# Patient Record
Sex: Female | Born: 1970
Health system: Southern US, Community
[De-identification: ages and names within clinical notes are randomized; demographics above are authoritative.]

## PROBLEM LIST (undated history)

## (undated) ENCOUNTER — Ambulatory Visit: Admission: EM | Payer: 59 | Source: Home / Self Care

## (undated) DIAGNOSIS — K219 Gastro-esophageal reflux disease without esophagitis: Secondary | ICD-10-CM

## (undated) DIAGNOSIS — T7840XA Allergy, unspecified, initial encounter: Secondary | ICD-10-CM

## (undated) DIAGNOSIS — Z803 Family history of malignant neoplasm of breast: Secondary | ICD-10-CM

## (undated) HISTORY — DX: Gastro-esophageal reflux disease without esophagitis: K21.9

## (undated) HISTORY — DX: Allergy, unspecified, initial encounter: T78.40XA

## (undated) HISTORY — DX: Family history of malignant neoplasm of breast: Z80.3

## (undated) HISTORY — PX: WISDOM TOOTH EXTRACTION: SHX21

---

## 2003-12-16 ENCOUNTER — Emergency Department (HOSPITAL_COMMUNITY): Admission: EM | Admit: 2003-12-16 | Discharge: 2003-12-16 | Payer: Self-pay | Admitting: Family Medicine

## 2006-09-12 ENCOUNTER — Encounter (INDEPENDENT_AMBULATORY_CARE_PROVIDER_SITE_OTHER): Payer: Self-pay | Admitting: Specialist

## 2006-09-12 ENCOUNTER — Inpatient Hospital Stay (HOSPITAL_COMMUNITY): Admission: AD | Admit: 2006-09-12 | Discharge: 2006-09-12 | Payer: Self-pay | Admitting: Gynecology

## 2006-09-14 ENCOUNTER — Inpatient Hospital Stay (HOSPITAL_COMMUNITY): Admission: AD | Admit: 2006-09-14 | Discharge: 2006-09-14 | Payer: Self-pay | Admitting: Obstetrics and Gynecology

## 2006-09-17 ENCOUNTER — Inpatient Hospital Stay (HOSPITAL_COMMUNITY): Admission: AD | Admit: 2006-09-17 | Discharge: 2006-09-17 | Payer: Self-pay | Admitting: Family Medicine

## 2006-09-18 ENCOUNTER — Ambulatory Visit: Payer: Self-pay | Admitting: Family Medicine

## 2006-09-18 LAB — CONVERTED CEMR LAB
ALT: 36 units/L (ref 0–40)
Basophils Relative: 0.1 % (ref 0.0–1.0)
Bilirubin, Direct: 0.1 mg/dL (ref 0.0–0.3)
CO2: 31 meq/L (ref 19–32)
Calcium: 9.5 mg/dL (ref 8.4–10.5)
Eosinophils Relative: 0.7 % (ref 0.0–5.0)
GFR calc Af Amer: 144 mL/min
Glucose, Bld: 218 mg/dL — ABNORMAL HIGH (ref 70–99)
Hemoglobin: 14.4 g/dL (ref 12.0–15.0)
Lymphocytes Relative: 25.9 % (ref 12.0–46.0)
Microalb Creat Ratio: 11.2 mg/g (ref 0.0–30.0)
Monocytes Absolute: 0.4 10*3/uL (ref 0.2–0.7)
Neutro Abs: 4.4 10*3/uL (ref 1.4–7.7)
Potassium: 3.8 meq/L (ref 3.5–5.1)
Total Bilirubin: 0.6 mg/dL (ref 0.3–1.2)
Total Protein: 7.1 g/dL (ref 6.0–8.3)
VLDL: 54 mg/dL — ABNORMAL HIGH (ref 0–40)
WBC: 6.5 10*3/uL (ref 4.5–10.5)

## 2006-09-25 ENCOUNTER — Ambulatory Visit: Payer: Self-pay | Admitting: Family Medicine

## 2006-10-01 ENCOUNTER — Encounter: Admission: RE | Admit: 2006-10-01 | Discharge: 2006-12-30 | Payer: Self-pay | Admitting: Family Medicine

## 2006-10-19 ENCOUNTER — Ambulatory Visit: Payer: Self-pay | Admitting: Family Medicine

## 2006-10-25 ENCOUNTER — Encounter: Admission: RE | Admit: 2006-10-25 | Discharge: 2006-10-25 | Payer: Self-pay | Admitting: Family Medicine

## 2006-11-06 ENCOUNTER — Ambulatory Visit: Payer: Self-pay | Admitting: Family Medicine

## 2006-12-14 ENCOUNTER — Ambulatory Visit: Payer: Self-pay | Admitting: Family Medicine

## 2006-12-25 ENCOUNTER — Ambulatory Visit: Payer: Self-pay | Admitting: Family Medicine

## 2007-01-08 ENCOUNTER — Ambulatory Visit: Payer: Self-pay | Admitting: Family Medicine

## 2007-01-08 ENCOUNTER — Encounter: Payer: Self-pay | Admitting: Family Medicine

## 2007-01-08 ENCOUNTER — Other Ambulatory Visit: Admission: RE | Admit: 2007-01-08 | Discharge: 2007-01-08 | Payer: Self-pay | Admitting: Family Medicine

## 2007-01-21 ENCOUNTER — Encounter: Payer: Self-pay | Admitting: Family Medicine

## 2007-01-21 DIAGNOSIS — E119 Type 2 diabetes mellitus without complications: Secondary | ICD-10-CM | POA: Insufficient documentation

## 2007-02-12 ENCOUNTER — Ambulatory Visit: Payer: Self-pay | Admitting: Family Medicine

## 2007-02-12 LAB — CONVERTED CEMR LAB
Chloride: 103 meq/L (ref 96–112)
Cholesterol: 213 mg/dL (ref 0–200)
GFR calc Af Amer: 103 mL/min
GFR calc non Af Amer: 85 mL/min
Hgb A1c MFr Bld: 9 % — ABNORMAL HIGH (ref 4.6–6.0)
Potassium: 3.9 meq/L (ref 3.5–5.1)
Sodium: 137 meq/L (ref 135–145)
Total CHOL/HDL Ratio: 4.7
Triglycerides: 167 mg/dL — ABNORMAL HIGH (ref 0–149)
VLDL: 33 mg/dL (ref 0–40)

## 2007-03-15 ENCOUNTER — Ambulatory Visit: Payer: Self-pay | Admitting: Family Medicine

## 2007-06-11 ENCOUNTER — Ambulatory Visit: Payer: Self-pay | Admitting: Family Medicine

## 2007-06-16 ENCOUNTER — Emergency Department (HOSPITAL_COMMUNITY): Admission: EM | Admit: 2007-06-16 | Discharge: 2007-06-16 | Payer: Self-pay | Admitting: Family Medicine

## 2007-07-05 ENCOUNTER — Ambulatory Visit: Payer: Self-pay | Admitting: Internal Medicine

## 2007-07-05 DIAGNOSIS — J029 Acute pharyngitis, unspecified: Secondary | ICD-10-CM | POA: Insufficient documentation

## 2007-07-10 DIAGNOSIS — T7840XA Allergy, unspecified, initial encounter: Secondary | ICD-10-CM | POA: Insufficient documentation

## 2007-07-12 ENCOUNTER — Telehealth: Payer: Self-pay | Admitting: Family Medicine

## 2007-07-12 ENCOUNTER — Ambulatory Visit: Payer: Self-pay | Admitting: Family Medicine

## 2007-11-25 ENCOUNTER — Telehealth: Payer: Self-pay | Admitting: *Deleted

## 2008-01-06 ENCOUNTER — Encounter: Payer: Self-pay | Admitting: *Deleted

## 2008-01-06 ENCOUNTER — Telehealth: Payer: Self-pay | Admitting: Family Medicine

## 2008-03-12 ENCOUNTER — Telehealth: Payer: Self-pay | Admitting: *Deleted

## 2008-03-27 ENCOUNTER — Telehealth: Payer: Self-pay | Admitting: Family Medicine

## 2008-07-25 LAB — HM DIABETES EYE EXAM: HM Diabetic Eye Exam: NORMAL

## 2008-07-31 ENCOUNTER — Telehealth: Payer: Self-pay | Admitting: Family Medicine

## 2008-08-21 ENCOUNTER — Telehealth: Payer: Self-pay | Admitting: Family Medicine

## 2008-09-17 ENCOUNTER — Telehealth: Payer: Self-pay | Admitting: Family Medicine

## 2008-09-23 ENCOUNTER — Telehealth: Payer: Self-pay | Admitting: Family Medicine

## 2008-09-25 ENCOUNTER — Telehealth: Payer: Self-pay | Admitting: Family Medicine

## 2008-09-25 ENCOUNTER — Telehealth: Payer: Self-pay | Admitting: *Deleted

## 2008-10-13 ENCOUNTER — Telehealth: Payer: Self-pay | Admitting: Family Medicine

## 2008-10-16 ENCOUNTER — Ambulatory Visit: Payer: Self-pay | Admitting: Family Medicine

## 2008-10-16 LAB — CONVERTED CEMR LAB
AST: 29 units/L (ref 0–37)
Albumin: 3.9 g/dL (ref 3.5–5.2)
Alkaline Phosphatase: 65 units/L (ref 39–117)
Basophils Absolute: 0 10*3/uL (ref 0.0–0.1)
Basophils Relative: 0.1 % (ref 0.0–3.0)
Bilirubin Urine: NEGATIVE
Bilirubin, Direct: 0 mg/dL (ref 0.0–0.3)
Blood in Urine, dipstick: NEGATIVE
Calcium: 9.8 mg/dL (ref 8.4–10.5)
Cholesterol: 292 mg/dL — ABNORMAL HIGH (ref 0–200)
Creatinine, Ser: 0.7 mg/dL (ref 0.4–1.2)
Direct LDL: 125.8 mg/dL
Eosinophils Absolute: 0.4 10*3/uL (ref 0.0–0.7)
GFR calc non Af Amer: 98.38 mL/min (ref >60)
HDL: 58.6 mg/dL (ref >39.00)
Hemoglobin: 14.1 g/dL (ref 12.0–15.0)
Ketones, urine, test strip: NEGATIVE
Lymphocytes Relative: 24.7 % (ref 12.0–46.0)
MCHC: 35.4 g/dL (ref 30.0–36.0)
Monocytes Relative: 7.3 % (ref 3.0–12.0)
Neutro Abs: 4.8 10*3/uL (ref 1.4–7.7)
Neutrophils Relative %: 63.3 % (ref 43.0–77.0)
Nitrite: NEGATIVE
Protein, U semiquant: NEGATIVE
RBC: 4.69 M/uL (ref 3.87–5.11)
RDW: 12.4 % (ref 11.5–14.6)
Sodium: 141 meq/L (ref 135–145)
Total CHOL/HDL Ratio: 5
Triglycerides: 533 mg/dL — ABNORMAL HIGH (ref 0.0–149.0)
Urobilinogen, UA: 0.2

## 2008-10-20 ENCOUNTER — Telehealth: Payer: Self-pay | Admitting: Internal Medicine

## 2008-10-23 ENCOUNTER — Ambulatory Visit: Payer: Self-pay | Admitting: Family Medicine

## 2008-10-23 ENCOUNTER — Other Ambulatory Visit: Admission: RE | Admit: 2008-10-23 | Discharge: 2008-10-23 | Payer: Self-pay | Admitting: Family Medicine

## 2008-10-23 ENCOUNTER — Encounter: Payer: Self-pay | Admitting: Family Medicine

## 2008-10-23 DIAGNOSIS — E785 Hyperlipidemia, unspecified: Secondary | ICD-10-CM | POA: Insufficient documentation

## 2008-11-04 ENCOUNTER — Ambulatory Visit: Payer: Self-pay | Admitting: Family Medicine

## 2008-12-02 ENCOUNTER — Ambulatory Visit: Payer: Self-pay | Admitting: Family Medicine

## 2008-12-02 ENCOUNTER — Encounter: Admission: RE | Admit: 2008-12-02 | Discharge: 2009-03-02 | Payer: Self-pay | Admitting: Family Medicine

## 2008-12-24 ENCOUNTER — Telehealth: Payer: Self-pay | Admitting: Family Medicine

## 2008-12-31 ENCOUNTER — Telehealth: Payer: Self-pay | Admitting: Family Medicine

## 2009-02-02 ENCOUNTER — Ambulatory Visit: Payer: Self-pay | Admitting: Family Medicine

## 2009-02-02 LAB — CONVERTED CEMR LAB
CO2: 30 meq/L (ref 19–32)
Chloride: 104 meq/L (ref 96–112)
Glucose, Bld: 127 mg/dL — ABNORMAL HIGH (ref 70–99)
HDL: 50.8 mg/dL (ref >39.00)
Hgb A1c MFr Bld: 7.4 % — ABNORMAL HIGH (ref 4.6–6.5)
LDL Cholesterol: 81 mg/dL (ref 0–99)
Sodium: 141 meq/L (ref 135–145)
Total CHOL/HDL Ratio: 3
Triglycerides: 168 mg/dL — ABNORMAL HIGH (ref 0.0–149.0)
VLDL: 33.6 mg/dL (ref 0.0–40.0)

## 2009-02-03 ENCOUNTER — Telehealth: Payer: Self-pay | Admitting: Family Medicine

## 2009-02-09 ENCOUNTER — Ambulatory Visit: Payer: Self-pay | Admitting: Family Medicine

## 2009-03-12 ENCOUNTER — Encounter: Admission: RE | Admit: 2009-03-12 | Discharge: 2009-03-12 | Payer: Self-pay | Admitting: Family Medicine

## 2009-04-06 ENCOUNTER — Telehealth: Payer: Self-pay | Admitting: Family Medicine

## 2009-04-20 ENCOUNTER — Telehealth: Payer: Self-pay | Admitting: Family Medicine

## 2009-04-23 ENCOUNTER — Telehealth: Payer: Self-pay | Admitting: Family Medicine

## 2009-05-13 ENCOUNTER — Ambulatory Visit: Payer: Self-pay | Admitting: Family Medicine

## 2009-05-13 LAB — CONVERTED CEMR LAB
BUN: 12 mg/dL (ref 6–23)
Calcium: 9.5 mg/dL (ref 8.4–10.5)
Creatinine, Ser: 0.8 mg/dL (ref 0.4–1.2)
GFR calc non Af Amer: 84.09 mL/min (ref >60)
Hgb A1c MFr Bld: 7.2 % — ABNORMAL HIGH (ref 4.6–6.5)

## 2009-05-17 ENCOUNTER — Ambulatory Visit: Payer: Self-pay | Admitting: Family Medicine

## 2009-05-17 ENCOUNTER — Telehealth: Payer: Self-pay | Admitting: Family Medicine

## 2009-05-17 DIAGNOSIS — M25519 Pain in unspecified shoulder: Secondary | ICD-10-CM | POA: Insufficient documentation

## 2009-05-19 ENCOUNTER — Telehealth: Payer: Self-pay | Admitting: Family Medicine

## 2009-06-17 ENCOUNTER — Encounter: Payer: Self-pay | Admitting: *Deleted

## 2009-07-07 ENCOUNTER — Encounter: Admission: RE | Admit: 2009-07-07 | Discharge: 2009-07-07 | Payer: Self-pay | Admitting: Orthopaedic Surgery

## 2009-07-26 ENCOUNTER — Encounter: Admission: RE | Admit: 2009-07-26 | Discharge: 2009-09-14 | Payer: Self-pay | Admitting: Orthopaedic Surgery

## 2009-08-13 ENCOUNTER — Telehealth: Payer: Self-pay | Admitting: Family Medicine

## 2009-08-19 ENCOUNTER — Ambulatory Visit: Payer: Self-pay | Admitting: Family Medicine

## 2009-08-19 LAB — CONVERTED CEMR LAB
CO2: 29 meq/L (ref 19–32)
Calcium: 9.1 mg/dL (ref 8.4–10.5)
Creatinine, Ser: 0.7 mg/dL (ref 0.4–1.2)
GFR calc non Af Amer: 97.97 mL/min (ref >60)
Sodium: 141 meq/L (ref 135–145)

## 2009-08-24 ENCOUNTER — Ambulatory Visit: Payer: Self-pay | Admitting: Family Medicine

## 2009-10-21 ENCOUNTER — Ambulatory Visit: Payer: Self-pay | Admitting: Family Medicine

## 2009-10-21 LAB — CONVERTED CEMR LAB
ALT: 26 units/L (ref 0–35)
AST: 19 units/L (ref 0–37)
Bilirubin, Direct: 0.1 mg/dL (ref 0.0–0.3)
Calcium: 9.4 mg/dL (ref 8.4–10.5)
Cholesterol: 151 mg/dL (ref 0–200)
Eosinophils Absolute: 0.3 10*3/uL (ref 0.0–0.7)
GFR calc non Af Amer: 83.9 mL/min (ref >60)
Glucose, Bld: 94 mg/dL (ref 70–99)
HDL: 55.5 mg/dL (ref >39.00)
Hgb A1c MFr Bld: 7.2 % — ABNORMAL HIGH (ref 4.6–6.5)
Lymphocytes Relative: 33.8 % (ref 12.0–46.0)
MCHC: 34.6 g/dL (ref 30.0–36.0)
MCV: 85.9 fL (ref 78.0–100.0)
Monocytes Absolute: 0.5 10*3/uL (ref 0.1–1.0)
Neutrophils Relative %: 54.7 % (ref 43.0–77.0)
Platelets: 321 10*3/uL (ref 150.0–400.0)
Sodium: 141 meq/L (ref 135–145)
TSH: 2.14 microintl units/mL (ref 0.35–5.50)
Total Bilirubin: 0.3 mg/dL (ref 0.3–1.2)
Total Protein: 6.9 g/dL (ref 6.0–8.3)
WBC: 7.3 10*3/uL (ref 4.5–10.5)

## 2009-11-18 ENCOUNTER — Other Ambulatory Visit: Admission: RE | Admit: 2009-11-18 | Discharge: 2009-11-18 | Payer: Self-pay | Admitting: Family Medicine

## 2009-11-18 ENCOUNTER — Ambulatory Visit: Payer: Self-pay | Admitting: Family Medicine

## 2010-02-15 ENCOUNTER — Ambulatory Visit: Payer: Self-pay | Admitting: Family Medicine

## 2010-02-15 LAB — CONVERTED CEMR LAB
BUN: 12 mg/dL (ref 6–23)
CO2: 28 meq/L (ref 19–32)
Chloride: 100 meq/L (ref 96–112)
Creatinine, Ser: 0.7 mg/dL (ref 0.4–1.2)
Glucose, Bld: 80 mg/dL (ref 70–99)

## 2010-02-22 ENCOUNTER — Ambulatory Visit: Payer: Self-pay | Admitting: Family Medicine

## 2010-03-18 ENCOUNTER — Encounter: Admission: RE | Admit: 2010-03-18 | Discharge: 2010-03-18 | Payer: Self-pay | Admitting: Family Medicine

## 2010-05-18 ENCOUNTER — Ambulatory Visit: Payer: Self-pay | Admitting: Family Medicine

## 2010-05-18 LAB — CONVERTED CEMR LAB
Chloride: 103 meq/L (ref 96–112)
Creatinine, Ser: 0.8 mg/dL (ref 0.4–1.2)
Direct LDL: 99.2 mg/dL
Hgb A1c MFr Bld: 7.2 % — ABNORMAL HIGH (ref 4.6–6.5)
Potassium: 4 meq/L (ref 3.5–5.1)
Sodium: 139 meq/L (ref 135–145)
Total CHOL/HDL Ratio: 3
VLDL: 56.8 mg/dL — ABNORMAL HIGH (ref 0.0–40.0)

## 2010-05-25 ENCOUNTER — Ambulatory Visit: Payer: Self-pay | Admitting: Family Medicine

## 2010-07-16 ENCOUNTER — Ambulatory Visit
Admission: RE | Admit: 2010-07-16 | Discharge: 2010-07-16 | Payer: Self-pay | Source: Home / Self Care | Attending: Family Medicine | Admitting: Family Medicine

## 2010-07-18 ENCOUNTER — Telehealth: Payer: Self-pay | Admitting: Family Medicine

## 2010-07-31 ENCOUNTER — Encounter: Payer: Self-pay | Admitting: Orthopaedic Surgery

## 2010-08-09 NOTE — Assessment & Plan Note (Signed)
Summary: 3 MONTH ROV/NJR   Vital Signs:  Patient profile:   40 year old female Menstrual status:  regular Weight:      205 pounds Temp:     98.5 degrees F oral BP sitting:   124 / 82  (left arm) Cuff size:   regular  Vitals Entered By: [name] CMA [name]) (May 25, 2010 8:43 AM) CC: follow-up visit   CC:  follow-up visit.  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  She came in last week for an A1c and a BMP.  Her fasting blood sugar was 58, and she felt shaky.  A1c 7.2, which is what is than before.  She is not walking on a regular basis, although she does have C. exercise equipment at home.  She was encouraged to take the stairs instead of the elevator at work.  We talked about tighter control however, with a normal blood sugar running around 80.  Not sure that tight control would be good for her.  I  Allergies: 1)  ! Pcn  Past History:  Past medical, surgical, family and social histories (including risk factors) reviewed for relevance to current acute and chronic problems.  Past Medical History: Reviewed history from 01/21/2007 and no changes required. Diabetes mellitus, type II  Past Surgical History: Reviewed history from 01/21/2007 and no changes required. CB X1 MCX1  Family History: Reviewed history from 12/02/2008 and no changes required. Family History Diabetes 1st degree relative  Social History: Reviewed history from 10/23/2008 and no changes required. Occupation:  Charity fundraiser Divorced Never Smoked Alcohol use-no Drug use-no Regular exercise-no  Review of Systems      See HPI  Physical Exam  General:  Well-developed,well-nourished,in no acute distress; alert,appropriate and cooperative throughout examination   Impression & Recommendations:  Problem # 1:  DIABETES MELLITUS, TYPE I, ADULT ONSET (ICD-250.01) Assessment Improved  Her  updated medication list for this problem includes:    Lisinopril 5 Mg Tabs (Lisinopril) ..... One daily    Metformin Hcl 1000 Mg Tabs (Metformin hcl) .Marland Kitchen... Take 1 tablet by mouth twice a day    Glipizide 10 Mg Tabs (Glipizide) .Marland Kitchen... Take 1 tablet by mouth two times a day    Lantus Solostar 100 Unit/ml Soln (Insulin glargine) ..... Use 36 in the morning and 32 at bedtime  Complete Medication List: 1)  Accu-chek Aviva Strp (Glucose blood) .... Blood sugar 3 times a day 2)  Lisinopril 5 Mg Tabs (Lisinopril) .... One daily 3)  Metformin Hcl 1000 Mg Tabs (Metformin hcl) .... Take 1 tablet by mouth twice a day 4)  Diasense Multivitamin Tabs (Multiple vitamins-minerals) .... One daily 5)  Asa 81 Mg  6)  Glipizide 10 Mg Tabs (Glipizide) .... Take 1 tablet by mouth two times a day 7)  Necon 1/35 (28) 1-35 Mg-mcg Tabs (Norethindrone-eth estradiol) .... Uad 8)  Lantus Solostar 100 Unit/ml Soln (Insulin glargine) .... Use 36 in the morning and 32 at bedtime 9)  Zocor 20 Mg Tabs (Simvastatin) .Marland Kitchen.. 1 tab @ bedtime 10)  Zyrtec Allergy 10 Mg Tabs (Cetirizine hcl) .... Once daily 11)  Bd Lancet Ultrafine 30g Misc (Lancets) .... Use once daily to check glucose levels 12)  Pen Needles 5/16" 31g X 8 Mm Misc (Insulin pen needle) .... Use daily for glucose control  dx 250.00 okay for solostar 13)  Vicodin Es 7.5-750 Mg Tabs (Hydrocodone-acetaminophen) .... Take one  tab by mouth every 6 hours as needed for pain 14)  Advil 200 Mg Tabs (Ibuprofen) .... Take one tab by mouth once daily as needed  Patient Instructions: 1)  Please schedule a follow-up appointment in 3 months. 2)  BMP prior to visit, ICD-9: 3)  HbgA1C prior to visit, ICD-9:.....250.01 4)  Let's keep your medication, the same, but increased her caloric output by walking 15 minutes daily and taking the stairs at work   Orders Added: 1)  Est. Patient Level III [[address]]

## 2010-08-09 NOTE — Assessment & Plan Note (Signed)
Summary: Follow up   Vital Signs:  Patient profile:   40 year old female Menstrual status:  regular Weight:      204 pounds Temp:     98.4 degrees F oral BP sitting:   120 / 80  (left arm) Cuff size:   regular  Vitals Entered By: [name] CMA [name]) (February 22, 2010 10:03 AM)  CC: follow-up visit   CC:  follow-up visit.  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  Her current regime is metformin 1000 mg b.i.d., glipizide 10 b.i.d., and insulin twice daily.  Her fasting blood sugar is 80 with an A1c of 7.2%.  No hypoglycemia.  She is also taking her Zocor 20 mg Monday, Wednesday, Friday, what she had a follow-up lipid panel in 3 months  Allergies: 1)  ! Pcn  Past History:  Past medical, surgical, family and social histories (including risk factors) reviewed for relevance to current acute and chronic problems.  Past Medical History: Reviewed history from 01/21/2007 and no changes required. Diabetes mellitus, type II  Past Surgical History: Reviewed history from 01/21/2007 and no changes required. CB X1 MCX1  Family History: Reviewed history from 12/02/2008 and no changes required. Family History Diabetes 1st degree relative  Social History: Reviewed history from 10/23/2008 and no changes required. 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 One daily    Metformin Hcl 1000 Mg Tabs (Metformin hcl) .Marland Kitchen... Take 1 tablet by mouth twice a day    Glipizide 10  Mg Tabs (Glipizide) .Marland Kitchen... Take 1 tablet by mouth two times a day    Lantus Solostar 100 Unit/ml Soln (Insulin glargine) ..... Use 36 in the morning and 32 at bedtime  Complete Medication List: 1)  Accu-chek Aviva Strp (Glucose blood) .... Blood sugar 3 times a day 2)  Lisinopril 5 Mg Tabs (Lisinopril) .... One daily 3)  Metformin Hcl 1000 Mg Tabs (Metformin hcl) .... Take 1 tablet by mouth twice a day 4)  Diasense Multivitamin Tabs (Multiple vitamins-minerals) .... One daily 5)  Asa 81 Mg  6)  Glipizide 10 Mg Tabs (Glipizide) .... Take 1 tablet by mouth two times a day 7)  Necon 1/35 (28) 1-35 Mg-mcg Tabs (Norethindrone-eth estradiol) .... Uad 8)  Lantus Solostar 100 Unit/ml Soln (Insulin glargine) .... Use 36 in the morning and 32 at bedtime 9)  Zocor 20 Mg Tabs (Simvastatin) .Marland Kitchen.. 1 tab @ bedtime 10)  Zyrtec Allergy 10 Mg Tabs (Cetirizine hcl) .... Once daily 11)  Bd Lancet Ultrafine 30g Misc (Lancets) .... Use once daily to check glucose levels 12)  Pen Needles 5/16" 31g X 8 Mm Misc (Insulin pen needle) .... Use daily for glucose control  dx 250.00 okay for solostar 13)  Vicodin Es 7.5-750 Mg Tabs (Hydrocodone-acetaminophen) .... Take one tab by mouth every 6 hours as needed for pain 14)  Advil 200 Mg Tabs (Ibuprofen) .... Take one tab by mouth once daily as needed  Patient Instructions: 1)  Please schedule a follow-up appointment in 3 months..250.01 2)  BMP prior to visit, ICD-9: 3)  Lipid Panel prior to visit, ICD-9: 4)  HbgA1C prior to visit, ICD-9:

## 2010-08-09 NOTE — Progress Notes (Signed)
Summary: refill  Phone Note From Pharmacy   Summary of Call: new rx for needles for lantus solostar  Initial call taken by: [name] CMA [name]),  August 13, 2009 5:37 PM    New/Updated Medications: PEN NEEDLES 5/16" 31G X 8 MM MISC (INSULIN PEN NEEDLE) use daily for glucose control  dx 250.00 okay for solostar Prescriptions: PEN NEEDLES 5/16" 31G X 8 MM MISC (INSULIN PEN NEEDLE) use daily for glucose control  dx 250.00 okay for solostar  #100 x 3   Entered by:   [name] CMA (AAMA)   Authorized by:   Roderick Pee MD   Signed by:   [name] CMA (AAMA) on 08/13/2009   Method used:   Electronically to        [facility]* (retail)       [address]       [address] Shipping/mailing       [place], [place]  [address]       Ph: [phone]       Fax: [phone]   RxID:   [id number]

## 2010-08-09 NOTE — Assessment & Plan Note (Signed)
Summary: cpx/pap/njr--- PT [facility] // RS   Vital Signs:  Patient profile:   40 year old female Menstrual status:  regular LMP:     11/08/2009 Height:      66.5 inches Weight:      205 pounds BMI:     32.71 Temp:     98.8 degrees F oral Pulse rate:   70 / minute Pulse rhythm:   regular BP sitting:   118 / 80  (right arm)  Vitals Entered By: [name] CMA [name]) (Nov 18, 2009 8:32 AM) CC: cpx LMP (date): 11/08/2009     Menstrual Status regular Enter LMP: 11/08/2009 Last PAP Result NEGATIVE FOR INTRAEPITHELIAL LESIONS OR MALIGNANCY.   CC:  cpx.  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  Her diabetes is managed with metformin 1000 mg b.i.d., glipizide 10 mg b.i.d., and Lantus 36 units in the morning and 32 units at bedtime.  Her fasting blood sugars at home are around 95.  Hemoglobin A1c 7.2%.  She states she feels well and has no hypoglycemia.  Exam December 2010 normal.  No retinopathy.  She takes lisinopril 5 mg daily for renal protection.  BP 118/80.  She is on BCPs.  Last 5256, normal.  She takes Zocor 20 mg nightly for hyperlipidemia.  Lipids are goal with an LDL of 62.  She wants to know if she can decrease her dose.  She takes Zyrtec or TC for allergic rhinitis.  She gets routine eye care.  Dental care does BSE monthly and gets any mammography.  Tetanus 2007 seasonal flu 2010.  She has a frozen right shoulder.  She is currently trying to accumulate paltime  for surgery  Allergies: 1)  ! Pcn  Past History:  Past medical, surgical, family and social histories (including risk factors) reviewed, and no changes noted (except as noted below).  Past Medical History: Reviewed history from 01/21/2007 and no changes required. Diabetes mellitus, type II  Past Surgical History: Reviewed history from 01/21/2007 and no changes required. CB X1 MCX1  Family  History: Reviewed history from 12/02/2008 and no changes required. Family History Diabetes 1st degree relative  Social History: Reviewed history from 10/23/2008 and no changes required. 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 No apparent alopecia or balding. Eyes:  No corneal or conjunctival inflammation noted. EOMI. Perrla. Funduscopic exam benign, without hemorrhages, exudates or papilledema. Vision grossly normal. Ears:  External ear exam shows no significant lesions or deformities.  Otoscopic examination reveals clear canals, tympanic membranes are intact bilaterally without bulging, retraction, inflammation or discharge. Hearing is grossly normal bilaterally. Nose:  External nasal examination shows no deformity or inflammation. Nasal mucosa are pink and moist without lesions or exudates. Mouth:  Oral mucosa and oropharynx without lesions or exudates.  Teeth in good repair. Neck:  No deformities, masses, or tenderness noted. Chest Wall:  No deformities, masses, or tenderness noted. Breasts:  No mass, nodules, thickening, tenderness, bulging, retraction, inflamation, nipple discharge or skin changes noted.   Lungs:  Normal respiratory effort, chest expands symmetrically. Lungs are clear to auscultation, no crackles or wheezes. Heart:  Normal rate and regular rhythm. S1 and S2 normal without gallop, murmur,  click, rub or other extra sounds. Abdomen:  Bowel sounds positive,abdomen soft and non-tender without masses, organomegaly or hernias noted. Rectal:  No external abnormalities noted. Normal sphincter tone. No rectal masses or tenderness. Genitalia:  Pelvic Exam:        External: normal female genitalia  without lesions or masses        Vagina: normal without lesions or masses        Cervix: normal without lesions or masses        Adnexa: normal bimanual exam without masses or fullness        Uterus: normal by palpation        Pap smear: performed Msk:  No deformity or scoliosis noted of thoracic or lumbar spine.   Pulses:  R and L carotid,radial,femoral,dorsalis pedis and posterior tibial pulses are full and equal bilaterally Extremities:  No clubbing, cyanosis, edema, or deformity noted with normal full range of motion of all joints.   Neurologic:  No cranial nerve deficits noted. Station and gait are normal. Plantar reflexes are down-going bilaterally. DTRs are symmetrical throughout. Sensory, motor and coordinative functions appear intact. Skin:  Intact without suspicious lesions or rashes Cervical Nodes:  No lymphadenopathy noted Axillary Nodes:  No palpable lymphadenopathy Inguinal Nodes:  No significant adenopathy Psych:  Cognition and judgment appear intact. Alert and cooperative with normal attention span and concentration. No apparent delusions, illusions, hallucinations  Diabetes Management Exam:    Foot Exam (with socks and/or shoes not present):       Sensory-Pinprick/Light touch:          Left medial foot (L-4): normal          Left dorsal foot (L-5): normal          Left lateral foot (S-1): normal          Right medial foot (L-4): normal          Right dorsal foot (L-5): normal          Right lateral foot (S-1): normal       Sensory-Monofilament:          Left foot: normal          Right foot: normal       Inspection:          Left foot: normal          Right foot: normal       Nails:          Left foot: normal          Right foot: normal    Eye Exam:       Eye Exam done elsewhere          Date: 06/24/2009          Results: normal          Done by: opth   Impression & Recommendations:  Problem # 1:  DIABETES MELLITUS, TYPE I, ADULT ONSET  (ICD-250.01) Assessment Improved  Her updated medication list for this problem includes:    Lisinopril 5 Mg Tabs (Lisinopril) ..... One daily    Metformin Hcl 1000 Mg Tabs (Metformin hcl) .Marland Kitchen... Take 1 tablet by mouth twice a day    Glipizide 10 Mg Tabs (Glipizide) .Marland Kitchen... Take 1 tablet by mouth two times a day    Lantus Solostar 100 Unit/ml Soln (Insulin glargine) ..... Use 36 in the morning and 32 at bedtime  Orders: Prescription Created Electronically [phone])  Problem # 2:  HYPERLIPIDEMIA (ICD-272.4)  Assessment: Improved  Her updated medication list for this problem includes:    Zocor 20 Mg Tabs (Simvastatin) .Marland Kitchen... 1 tab @ bedtime  Orders: Prescription Created Electronically [phone]) EKG w/ Interpretation ([address])  Problem # 3:  CONTRACEPTIVE PRESCRIPTION, MEASURE NEC (ICD-V25.02) Assessment: Improved  Problem # 4:  Preventive Health Care (ICD-V70.0) Assessment: Unchanged  Orders: Prescription Created Electronically [id number]) EKG w/ Interpretation ([address])  Complete Medication List: 1)  Accu-chek Aviva Strp (Glucose blood) .... Blood sugar 3 times a day 2)  Lisinopril 5 Mg Tabs (Lisinopril) .... One daily 3)  Metformin Hcl 1000 Mg Tabs (Metformin hcl) .... Take 1 tablet by mouth twice a day 4)  Diasense Multivitamin Tabs (Multiple vitamins-minerals) .... One daily 5)  Asa 81 Mg  6)  Glipizide 10 Mg Tabs (Glipizide) .... Take 1 tablet by mouth two times a day 7)  Necon 1/35 (28) 1-35 Mg-mcg Tabs (Norethindrone-eth estradiol) .... Uad 8)  Lantus Solostar 100 Unit/ml Soln (Insulin glargine) .... Use 36 in the morning and 32 at bedtime 9)  Zocor 20 Mg Tabs (Simvastatin) .Marland Kitchen.. 1 tab @ bedtime 10)  Zyrtec Allergy 10 Mg Tabs (Cetirizine hcl) .... Once daily 11)  Bd Lancet Ultrafine 30g Misc (Lancets) .... Use once daily to check glucose levels 12)  Pen Needles 5/16" 31g X 8 Mm Misc (Insulin pen needle) .... Use daily for glucose control  dx 250.00 okay for solostar 13)  Vicodin Es  7.5-750 Mg Tabs (Hydrocodone-acetaminophen) .... Take one tab by mouth every 6 hours as needed for pain 14)  Advil 200 Mg Tabs (Ibuprofen) .... Take one tab by mouth once daily as needed  Patient Instructions: 1)  Please schedule a follow-up appointment in 3 months. 2)  BMP prior to visit, ICD-9: 3)  HbgA1C prior to visit, ICD-9:.....250.01 4)  Schedule your mammogram. 5)  Take an Aspirin every day. 6)  Check your blood sugars regularly. If your readings are usually above : or below 70 you should contact our office. 7)  It is important that your Diabetic A1c level is checked every 3 months. 8)  See your eye doctor yearly to check for diabetic eye damage. 9)  Check your feet each night for sore areas, calluses or signs of infection. 10)  Check your Blood Pressure regularly. If it is above: you should make an appointment. 11)  date the 20 mg of Zocor, Monday, Wednesday, Friday Prescriptions: LANTUS SOLOSTAR 100 UNIT/ML  SOLN (INSULIN GLARGINE) use 36 in the morning and 32 at bedtime  #3 x 6   Entered and Authorized by:   Roderick Pee MD   Signed by:   Roderick Pee MD on 11/18/2009   Method used:   Electronically to        [facility]* (retail)       [address]       [address] Shipping/mailing       [place], [place]  [address]       Ph: [phone]       Fax: [phone]   RxID:   [id number] PEN NEEDLES 5/16" 31G X 8 MM MISC (INSULIN PEN NEEDLE) use daily for glucose control  dx 250.00 okay for solostar  #100 x 3   Entered and Authorized by:   Roderick Pee MD   Signed by:   Roderick Pee MD on 11/18/2009   Method used:   Electronically to        [facility]* (  retail)       [address]       [address] Shipping/mailing       [place], [place]  [address]       Ph: [phone]       Fax: [phone]   RxID:   [id number] BD LANCET ULTRAFINE 30G  MISC (LANCETS) use once daily to check glucose levels  #100 x 3   Entered  and Authorized by:   Roderick Pee MD   Signed by:   Roderick Pee MD on 11/18/2009   Method used:   Electronically to        [facility]* (retail)       [address]       [address] Shipping/mailing       [place], [place]  [address]       Ph: [phone]       Fax: [phone]   RxID:   [id number] ZOCOR 20 MG TABS (SIMVASTATIN) 1 tab @ bedtime  #100 x 3   Entered and Authorized by:   Roderick Pee MD   Signed by:   Roderick Pee MD on 11/18/2009   Method used:   Electronically to        [facility]* (retail)       [address]       [address] Shipping/mailing       [place], [place]  [address]       Ph: [phone]       Fax: [phone]   RxID:   [id number] NECON 1/35 (28) 1-35 MG-MCG  TABS (NORETHINDRONE-ETH ESTRADIOL) UAD  #3 x 3   Entered and Authorized by:   Roderick Pee MD   Signed by:   Roderick Pee MD on 11/18/2009   Method used:   Electronically to        [facility]* (retail)       [address]       [address] Shipping/mailing       [place], [place]  [address]       Ph: [phone]       Fax: [phone]   RxID:   [id number] GLIPIZIDE 10 MG  TABS (GLIPIZIDE) Take 1 tablet by mouth two times a day  #200 x 3   Entered and Authorized by:   Roderick Pee MD   Signed by:   Roderick Pee MD on 11/18/2009   Method used:   Electronically to        [facility]* (retail)       [address]       [address] Shipping/mailing       [place], [place]  [address]       Ph: [phone]       Fax: [phone]   RxID:   [id number] METFORMIN HCL 1000 MG TABS (METFORMIN HCL) Take 1 tablet by mouth twice a day  #200 x 3   Entered and Authorized by:   Roderick Pee MD   Signed by:   Roderick Pee MD on 11/18/2009   Method used:   Electronically to        [facility]* (retail)       [address]       [address]  Shipping/mailing       [place], [place]  [address]       Ph: [phone]       Fax: [phone]   RxID:   [id number] LISINOPRIL 5 MG TABS (LISINOPRIL) one daily  #100 x 3   Entered and Authorized by:   Roderick Pee MD   Signed by:   Roderick Pee MD on 11/18/2009   Method used:   Electronically to        [facility]* (retail)       [address]       [address] Shipping/mailing       [place], [place]  [address]       Ph: [phone]       Fax: [phone]   RxID:   [id number] ACCU-CHEK AVIVA  STRP (GLUCOSE BLOOD) blood sugar 3 times a day  #300 x 3   Entered and Authorized by:   Roderick Pee MD   Signed by:   Roderick Pee MD on 11/18/2009   Method used:   Electronically to        [facility]* (retail)       [address]       [address] Shipping/mailing       [place], [place]  [address]       Ph: [phone]       Fax: [phone]   RxID:   [id number]    Immunization History:  Influenza Immunization History:    Influenza:  historical (04/09/2009)

## 2010-08-09 NOTE — Assessment & Plan Note (Signed)
Summary: 3 MO RETURN TO OFFICE/MAE/PT RSC/CJR   Vital Signs:  Patient profile:   40 year old female Weight:      211 pounds Temp:     98.9 degrees F oral BP sitting:   130 / 90  (left arm) Cuff size:   regular  Vitals Entered By: [name] CMA [name]) (August 24, 2009 11:19 AM)  Reason for Visit follow up office visit  History of Present Illness: Ladean is a 40 year old female, nonsmoker, who comes in for evaluation of type 1 diabetes.  We increased her Lantus to 36 units in the morning kept her evening dose of 32.  Blood sugar.  It is sustained with a one C. the same at 7.2, however, it she's not been able to walk because of right shoulder pain.  Dr. Cleophas Dunker has seen her.  She's had a problem with deviation of her right clavicle.  They tried injections and physical therapy.  The next option is surgical removal of the tip of her clavicle.  She's been taking 800 mg of Motrin 3 times a day.  Unfortunately, her be met as normal.  Advised to decrease dose to no more than 600 b.i.d..  Allergies: 1)  ! Pcn  Past History:  Past medical, surgical, family and social histories (including risk factors) reviewed for relevance to current acute and chronic problems.  Past Medical History: Reviewed history from 01/21/2007 and no changes required. Diabetes mellitus, type II  Past Surgical History: Reviewed history from 01/21/2007 and no changes required. CB X1 MCX1  Family History: Reviewed history from 12/02/2008 and no changes required. Family History Diabetes 1st degree relative  Social History: Reviewed history from 10/23/2008 and no changes required. Occupation:  Charity fundraiser Divorced Never Smoked Alcohol use-no Drug use-no Regular exercise-no  Review of Systems      See HPI  Physical Exam  General:  Well-developed,well-nourished,in no acute distress; alert,appropriate and cooperative throughout examination   Impression & Recommendations:  Problem # 1:  DIABETES  MELLITUS, TYPE I, ADULT ONSET (ICD-250.01) Assessment Improved  Her updated medication list for this problem includes:    Lisinopril 5 Mg Tabs (Lisinopril) ..... One daily    Metformin Hcl 1000 Mg Tabs (Metformin hcl) .Marland Kitchen... Take 1 tablet by mouth twice a day    Glipizide 10 Mg Tabs (Glipizide) .Marland Kitchen... Take 1 tablet by mouth two times a day    Lantus Solostar 100 Unit/ml Soln (Insulin glargine) ..... Use 32 in the morning and 32 at bedtime  Complete Medication List: 1)  Accu-chek Aviva Strp (Glucose blood) .... Blood sugar 3 times a day 2)  Lisinopril 5 Mg Tabs (Lisinopril) .... One daily 3)  Metformin Hcl 1000 Mg Tabs (Metformin hcl) .... Take 1 tablet by mouth twice a day 4)  Diasense Multivitamin Tabs (Multiple vitamins-minerals) .... One daily 5)  Asa 81 Mg  6)  Glipizide 10 Mg Tabs (Glipizide) .... Take 1 tablet by mouth two times a day 7)  Necon 1/35 (28) 1-35 Mg-mcg Tabs (Norethindrone-eth estradiol) .... Uad 8)  Lantus Solostar 100 Unit/ml Soln (Insulin glargine) .... Use 32 in the morning and 32 at bedtime 9)  Zocor 20 Mg Tabs (Simvastatin) .Marland Kitchen.. 1 tab @ bedtime 10)  Zyrtec Allergy 10 Mg Tabs (Cetirizine hcl) .... Once daily 11)  Bd Lancet Ultrafine 30g Misc (Lancets) .... Use once daily to check glucose levels 12)  Pen Needles 5/16" 31g X 8 Mm Misc (Insulin pen needle) .... Use daily for glucose control  dx 250.00  okay for solostar  Patient Instructions: 1)  return in April for your annual exam 2)  BMP prior to visit, ICD-9:............250.01 3)  Hepatic Panel prior to visit, ICD-9: 4)  Lipid Panel prior to visit, ICD-9: 5)  TSH prior to visit, ICD-9: 6)  CBC w/ Diff prior to visit, ICD-9: 7)  Urine-dip prior to visit, ICD-9: 8)  HbgA1C prior to visit, ICD-9: 9)  Urine Microalbumin prior to visit, ICD-9: 10)  continue your current insulin dose.  Walk 15 minutes daily

## 2010-08-11 NOTE — Assessment & Plan Note (Signed)
Summary: HEAD COLD AND CHEST CONJ/VJ   Vital Signs:  Patient profile:   40 year old female Menstrual status:  regular Height:      66.5 inches Weight:      206 pounds BMI:     32.87 O2 Sat:      97 % on Room air Temp:     98.9 degrees F oral Pulse rate:   106 / minute BP sitting:   132 / 80  (left arm) Cuff size:   large  Vitals Entered By: Payton Spark CMA (July 16, 2010 11:59 AM)  O2 Flow:  Room air CC: Chest congestion, cough, post nasal drip and ST x 5 days. OTC meds not helping.    CC:  Chest congestion, cough, and post nasal drip and ST x 5 days. OTC meds not helping. Marland Kitchen  History of Present Illness: [name] Young is a 40 y/o mfem ns type 1 dm, who comes in today for evalof a cough x 1 week....ros neg  Allergies: 1)  ! Pcn  Past History:  Past medical, surgical, family and social histories (including risk factors) reviewed for relevance to current acute and chronic problems.  Past Medical History: Reviewed history from 01/21/2007 and no changes required. Diabetes mellitus, type II  Past Surgical History: Reviewed history from 01/21/2007 and no changes required. CB X1 MCX1  Family History: Reviewed history from 12/02/2008 and no changes required. Family History Diabetes 1st degree relative  Social History: Reviewed history from 10/23/2008 and no changes required. 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 No apparent alopecia or balding. Eyes:  No corneal or conjunctival inflammation noted. EOMI. Perrla. Funduscopic exam benign, without hemorrhages, exudates or papilledema. Vision grossly normal. Ears:  External ear exam shows no significant lesions or deformities.  Otoscopic  examination reveals clear canals, tympanic membranes are intact bilaterally without bulging, retraction, inflammation or discharge. Hearing is grossly normal bilaterally. Nose:  External nasal examination shows no deformity or inflammation. Nasal mucosa are pink and moist without lesions or exudates. Mouth:  Oral mucosa and oropharynx without lesions or exudates.  Teeth in good repair. Neck:  No deformities, masses, or tenderness noted. Lungs:  Normal respiratory effort, chest expands symmetrically. Lungs are clear to auscultation, no crackles or wheezes.   Problems:  Medical Problems Added: 1)  Dx of Viral Infection-unspec  (ICD-079.99)  Impression & Recommendations:  Problem # 1:  VIRAL INFECTION-UNSPEC (ICD-079.99) Assessment New  Her updated medication list for this problem includes:    Advil 200 Mg Tabs (Ibuprofen) .Marland Kitchen... Take one tab by mouth once daily as needed    Hydromet 5-1.5 Mg/32ml Syrp (Hydrocodone-homatropine) .Marland Kitchen... 1/2 to 1 tsp three times a day prn  Complete Medication List: 1)  Accu-chek Aviva Strp (Glucose blood) .... Blood sugar 3 times a day 2)  Lisinopril 5 Mg Tabs (Lisinopril) .... One daily 3)  Metformin Hcl 1000 Mg Tabs (Metformin hcl) .... Take 1 tablet by mouth twice a day 4)  Diasense Multivitamin Tabs (Multiple vitamins-minerals) .... One daily 5)  Asa 81 Mg  6)  Glipizide 10 Mg Tabs (Glipizide) .... Take 1 tablet by mouth two times a day 7)  Necon 1/35 (28) 1-35 Mg-mcg Tabs (Norethindrone-eth estradiol) .... Uad 8)  Lantus Solostar 100 Unit/ml Soln (Insulin glargine) .Marland KitchenMarland KitchenMarland Kitchen  Use 36 in the morning and 32 at bedtime 9)  Zocor 20 Mg Tabs (Simvastatin) .Marland Kitchen.. 1 tab @ bedtime 10)  Zyrtec Allergy 10 Mg Tabs (Cetirizine hcl) .... Once daily 11)  Bd Lancet Ultrafine 30g Misc (Lancets) .... Use once daily to check glucose levels 12)  Pen Needles 5/16" 31g X 8 Mm Misc (Insulin pen needle) .... Use daily for glucose control  dx 250.00 okay for solostar 13)  Vicodin Es  7.5-750 Mg Tabs (Hydrocodone-acetaminophen) .... Take one tab by mouth every 6 hours as needed for pain 14)  Advil 200 Mg Tabs (Ibuprofen) .... Take one tab by mouth once daily as needed 15)  Hydromet 5-1.5 Mg/38ml Syrp (Hydrocodone-homatropine) .... 1/2 to 1 tsp three times a day prn  Patient Instructions: 1)  hydromet 1/2 to 1 tsp three times a day as needed 2)  Please schedule a follow-up appointment as needed. Prescriptions: HYDROMET 5-1.5 MG/5ML SYRP (HYDROCODONE-HOMATROPINE) 1/2 to 1 tsp three times a day prn  #8oz x 1   Entered and Authorized by:   Roderick Pee MD   Signed by:   Roderick Pee MD on 07/16/2010   Method used:   Print then Give to Patient   RxID:   [id number]    Orders Added: 1)  Est. Patient Level III [56213]

## 2010-08-11 NOTE — Progress Notes (Signed)
Summary: Call A Nurse   Call-A-Nurse Triage Call Report Triage Record Num: [id number] Operator: Revonda Humphrey Patient Name: [name] Call Date & Time: 07/16/2010 10:10:32AM Patient Phone: [phone] PCP: Patient Gender: Female PCP Fax : Patient DOB: Jan 20, 1971 [facility] Name: [facility] Reason for Call: Patient calling with cough and congestion that started Mon afternoon 07/11/2010. Lot of head congestion, drainage down back of throat, sore throat intermittently. Cough constantly at night, coughing up yellow mucus. Has taken Benadryl, Niquil, Robitussin, Sudafed, Zyrtec, Mucinex but not helping. Appt [facility] 11:15am Protocol(s) Used: Cough - Adult Recommended Outcome per Protocol: See Provider within 24 hours Reason for Outcome: Facial pain (fullness, pressure, worsens with bending over), frontal headache, yellow-green nasal discharge AND any temperature elevation Care Advice:  ~ 07/16/2010 10:24:34AM Page 1 of 1 [email or web address]

## 2010-08-16 ENCOUNTER — Other Ambulatory Visit: Payer: Self-pay

## 2010-08-23 ENCOUNTER — Encounter: Payer: Self-pay | Admitting: Family Medicine

## 2010-08-23 ENCOUNTER — Telehealth: Payer: Self-pay | Admitting: Family Medicine

## 2010-08-23 ENCOUNTER — Encounter (INDEPENDENT_AMBULATORY_CARE_PROVIDER_SITE_OTHER): Payer: Self-pay | Admitting: *Deleted

## 2010-08-23 ENCOUNTER — Other Ambulatory Visit: Payer: Self-pay | Admitting: Family Medicine

## 2010-08-23 ENCOUNTER — Other Ambulatory Visit: Payer: Commercial Managed Care - PPO

## 2010-08-23 DIAGNOSIS — E109 Type 1 diabetes mellitus without complications: Secondary | ICD-10-CM

## 2010-08-23 DIAGNOSIS — E785 Hyperlipidemia, unspecified: Secondary | ICD-10-CM

## 2010-08-23 LAB — BASIC METABOLIC PANEL
Chloride: 104 mEq/L (ref 96–112)
Creatinine, Ser: 0.8 mg/dL (ref 0.4–1.2)

## 2010-08-23 LAB — HEMOGLOBIN A1C: Hgb A1c MFr Bld: 7.2 % — ABNORMAL HIGH (ref 4.6–6.5)

## 2010-08-23 NOTE — Telephone Encounter (Signed)
ok 

## 2010-08-23 NOTE — Telephone Encounter (Signed)
Pt just had Hga1c and bmp drawn today over at [facility] office. Pt is req to have lipid panel added to lab.

## 2010-08-24 ENCOUNTER — Ambulatory Visit (INDEPENDENT_AMBULATORY_CARE_PROVIDER_SITE_OTHER): Payer: Commercial Managed Care - PPO | Admitting: Family Medicine

## 2010-08-24 ENCOUNTER — Encounter: Payer: Self-pay | Admitting: Family Medicine

## 2010-08-24 DIAGNOSIS — E119 Type 2 diabetes mellitus without complications: Secondary | ICD-10-CM

## 2010-08-24 DIAGNOSIS — E785 Hyperlipidemia, unspecified: Secondary | ICD-10-CM

## 2010-08-24 LAB — LIPID PANEL
Cholesterol: 164 mg/dL (ref 0–200)
HDL: 50 mg/dL (ref >39.00)
VLDL: 39 mg/dL (ref 0.0–40.0)

## 2010-08-24 NOTE — Patient Instructions (Signed)
Continue the oral medications.  Increase her morning dose of insulin to 40 units daily continue the 32 units at bedtime.  Walk 15 minutes daily.  30 minute appointment in May for your annual exam

## 2010-08-24 NOTE — Progress Notes (Signed)
  Subjective:    Patient ID: [name] Young, female    DOB: [birth year], 40 y.o.   MRN: [id number]  HPI Diahn is a 40 year old type I diabetic who comes in today for follow-up of diabetes and hyperlipidemia.  Her fasting blood sugar was 107, with a hemoglobin A1c is 7.2%.  Her current insulin dose is 32 units at bedtime and 36 units in the morning of Lantus and glipizide 10 mg b.i.d. And metformin 1000 mg b.i.d.  We discussed increasing her walking and increasing her insulin slightly to get her hemoglobin A1c around 6.5%.  She was taking her Zocor Monday, Wednesday, Friday, however, her lipids were not see therefore, increased it to daily.  Lipid panel pending   Review of Systems Negative    Objective:   Physical Exam Well-developed well-nourished, female, in no acute distress       Assessment & Plan:  Diabetes type I.  Plan increase her morning insulin from 36 units to 40.  Continue the 32 units at bedtime, and her oral medications.  Follow-up in May for her annual physical.  Hyperlipidemia.  Plan continue Zocor daily.  Call report

## 2010-11-15 ENCOUNTER — Other Ambulatory Visit: Payer: Commercial Managed Care - PPO

## 2010-11-22 ENCOUNTER — Ambulatory Visit: Payer: Commercial Managed Care - PPO | Admitting: Family Medicine

## 2010-11-25 NOTE — Assessment & Plan Note (Signed)
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   PAST MEDICAL HISTORY:  Childbirth x1.  She has a 74-year-old daughter.  She had a normal period in January, then got pregnant, and a week ago  had a miscarriage.  It was spontaneous.  She went to [facility],  no instrumentation was necessary.  She is doing fine.  She is still  having a little spotting, but otherwise fine.  She had wisdom teeth  removed as an outpatient, 1988.   PAST ILLNESSES:  None.   DRUG ALLERGIES:  None.   She does not smoke or drink any alcohol or use any drugs.   REVIEW OF SYSTEMS:  HEAD, EYES, EARS, NOSE AND THROAT:  Negative, except  she has astigmatism.  She does not get regular dental care, I have  advised her to do that.  CARDIOPULMONARY:  Negative.  GI:  Negative.  GU:  Negative.  GYN:  Gravida 2, para 1, recently had a miscarriage.  For birth control they normally use condoms, they missed one time, that  is when she got pregnant.  Last Pap was 1998.  She does do BSE on a  regular basis.  She has not had a mammogram in 6 years.  Parenthetically, her mother had breast cancer, diagnosed at age 68.  ENDO:  Negative.  Weight steady, except she states she has lost 10  pounds.  She attributes that to walking a lot, where she is currently  working on 67[employer] at [employer].  MUSCULOSKELETAL:  Negative.  VASCULAR:  Negative.  ALLERGY:  Negative.   SOCIAL HISTORY:  She is married, she lives here in [place].  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  She went to [employer].  Her  father was  in the army and they traveled around a lot.  She finished her nursing  school at [employer].   FAMILY HISTORY:  Dad was ex-army, [place], had diabetes type 2,  hypertension, high cholesterol, congestive heart failure, PTSD and  multiple strokes, and he died at 92.  Mom died at 45, breast cancer,  pneumonia, congestive heart failure.  She was diagnosed to have breast  cancer at age 13.  Maternal grandmother has no breast cancer, nobody  else in the family has breast cancer.  She has no brothers, no sisters.   VACCINATION HISTORY:  Immunizations are up to date.  She recalls having  a tetanus in 2007.   PROBLEM:  High blood sugar.  Patient noticed her blood sugar was  elevated when she went to the hospital last week.  She was told at that  time it was around 2-300.  Also, blood pressure was elevated.  They sent  her to [place] for followup and evaluation.  She has been asymptomatic.  Specifically, she has had no weight loss except for the 10 pounds she  attributes to the exercise.  She has had no visual disturbance, no  polyuria, etc.  Also of note, her blood pressure at that time was  140/100.  She says she got some 130/90, but never has gotten normal BPs.   PHYSICAL EVALUATION:  Height 5 feet 6 inches, weight 164.  BP 130/80,  pulse 70 and regular, and she is afebrile.  IN GENERAL:  She is a well-developed, well-nourished white female in no  acute distress.  Examination of the head, eyes, ears, nose, and throat were negative.  The neck was supple.  The thyroid is not enlarged.  CHEST:  Clear to auscultation.  CARDIAC EXAM:  Negative.  BREAST EXAM:  Negative.  She was taught to do BSE on a monthly basis and  get an annual mammogram.  ABDOMINAL EXAM:  Negative.  PELVIC AND RECTAL EXAM:  Deferred.  She is still spotting from the  miscarriage.  EXTREMITIES:  Normal skin, normal peripheral pulses.   Normal EKG with within normal limits, other lab pending.  Fasting blood  sugar  251.   IMPRESSION:  New onset diabetes.   PLAN:  Discuss diet, 2500 calories a day.  Walking, over and above her  normal activities, 15-20 minutes a day.  Checking blood sugar, fasting  and 2 hour PC.  Starting Glucophage 1000 mg 1/2 tablet before breakfast  and 1/2 tablet before p.m. meal.  Will get a DTC consult to talk about  the dietary aspects of treating diabetes.  Also start on 1 aspirin  tablet a day, a 5 mg Zestril tablet a day.  Will also draw lipids, etc.,  and see where we stand, although with her blood sugar being elevated,  explained to her lipids are going to be abnormal.   An hour was spent going through her history, physical, and explaining to  her all the nuances of new onset diabetes and her treatment plan.  She  is to come back and see [place] in a week for followup.     Jeffrey A. Tawanna Cooler, MD  Electronically Signed    JAT/MedQ  DD: 09/18/2006  DT: 09/20/2006  Job #: [id number]

## 2010-11-28 ENCOUNTER — Other Ambulatory Visit: Payer: Commercial Managed Care - PPO

## 2010-11-29 ENCOUNTER — Other Ambulatory Visit (INDEPENDENT_AMBULATORY_CARE_PROVIDER_SITE_OTHER): Payer: Commercial Managed Care - PPO

## 2010-11-29 DIAGNOSIS — E785 Hyperlipidemia, unspecified: Secondary | ICD-10-CM

## 2010-11-29 DIAGNOSIS — E119 Type 2 diabetes mellitus without complications: Secondary | ICD-10-CM

## 2010-11-29 LAB — CBC WITH DIFFERENTIAL/PLATELET
Basophils Absolute: 0 10*3/uL (ref 0.0–0.1)
Basophils Relative: 0.3 % (ref 0.0–3.0)
Eosinophils Absolute: 0.3 10*3/uL (ref 0.0–0.7)
Hemoglobin: 12.8 g/dL (ref 12.0–15.0)
Lymphocytes Relative: 23.4 % (ref 12.0–46.0)
Lymphs Abs: 1.9 10*3/uL (ref 0.7–4.0)
MCHC: 33.8 g/dL (ref 30.0–36.0)
MCV: 87 fl (ref 78.0–100.0)
Monocytes Absolute: 0.5 10*3/uL (ref 0.1–1.0)
Neutro Abs: 5.3 10*3/uL (ref 1.4–7.7)
RBC: 4.35 Mil/uL (ref 3.87–5.11)
RDW: 13.9 % (ref 11.5–14.6)

## 2010-11-29 LAB — URINALYSIS
Bilirubin Urine: NEGATIVE
Leukocytes, UA: NEGATIVE
Nitrite: NEGATIVE

## 2010-11-29 LAB — MICROALBUMIN / CREATININE URINE RATIO
Creatinine,U: 201.4 mg/dL
Microalb Creat Ratio: 0.2 mg/g (ref 0.0–30.0)
Microalb, Ur: 0.4 mg/dL (ref 0.0–1.9)

## 2010-11-29 LAB — TSH: TSH: 1.5 u[IU]/mL (ref 0.35–5.50)

## 2010-11-29 LAB — BASIC METABOLIC PANEL
CO2: 28 mEq/L (ref 19–32)
Calcium: 9.3 mg/dL (ref 8.4–10.5)
Chloride: 104 mEq/L (ref 96–112)
Glucose, Bld: 86 mg/dL (ref 70–99)
Sodium: 140 mEq/L (ref 135–145)

## 2010-11-29 LAB — HEPATIC FUNCTION PANEL
Albumin: 3.6 g/dL (ref 3.5–5.2)
Alkaline Phosphatase: 55 U/L (ref 39–117)
Total Protein: 6.6 g/dL (ref 6.0–8.3)

## 2010-11-29 LAB — HEMOGLOBIN A1C: Hgb A1c MFr Bld: 7.5 % — ABNORMAL HIGH (ref 4.6–6.5)

## 2010-12-06 ENCOUNTER — Other Ambulatory Visit (HOSPITAL_COMMUNITY)
Admission: RE | Admit: 2010-12-06 | Discharge: 2010-12-06 | Disposition: A | Discharge status: Home / Self Care | Payer: 59 | Source: Ambulatory Visit | Attending: Family Medicine | Admitting: Family Medicine

## 2010-12-06 ENCOUNTER — Ambulatory Visit (INDEPENDENT_AMBULATORY_CARE_PROVIDER_SITE_OTHER): Payer: Commercial Managed Care - PPO | Admitting: Family Medicine

## 2010-12-06 ENCOUNTER — Encounter: Payer: Self-pay | Admitting: Family Medicine

## 2010-12-06 DIAGNOSIS — E109 Type 1 diabetes mellitus without complications: Secondary | ICD-10-CM

## 2010-12-06 DIAGNOSIS — Z01419 Encounter for gynecological examination (general) (routine) without abnormal findings: Secondary | ICD-10-CM | POA: Insufficient documentation

## 2010-12-06 DIAGNOSIS — E785 Hyperlipidemia, unspecified: Secondary | ICD-10-CM

## 2010-12-06 DIAGNOSIS — Z0000 Encounter for general adult medical examination without abnormal findings: Secondary | ICD-10-CM

## 2010-12-06 DIAGNOSIS — E119 Type 2 diabetes mellitus without complications: Secondary | ICD-10-CM

## 2010-12-06 MED ORDER — METFORMIN HCL 1000 MG PO TABS: 1000.0000 mg | ORAL_TABLET | Freq: Two times a day (BID) | ORAL | Status: DC

## 2010-12-06 MED ORDER — BD LANCET ULTRAFINE 30G MISC: 40.0000 [IU] | Freq: Every day | Status: DC

## 2010-12-06 MED ORDER — SIMVASTATIN 20 MG PO TABS: 20.0000 mg | ORAL_TABLET | Freq: Every day | ORAL | Status: DC

## 2010-12-06 MED ORDER — INSULIN GLARGINE 100 UNIT/ML ~~LOC~~ SOLN: 40.0000 [IU] | Freq: Every day | SUBCUTANEOUS | Status: DC

## 2010-12-06 MED ORDER — INSULIN PEN NEEDLE 31G X 8 MM MISC: 40.0000 [IU] | Status: DC

## 2010-12-06 MED ORDER — GLIPIZIDE 10 MG PO TABS: ORAL_TABLET | ORAL | Status: DC

## 2010-12-06 MED ORDER — GLUCOSE BLOOD VI STRP: ORAL_STRIP | Status: DC

## 2010-12-06 MED ORDER — LISINOPRIL 5 MG PO TABS: 5.0000 mg | ORAL_TABLET | Freq: Every day | ORAL | Status: DC

## 2010-12-06 MED ORDER — NORETHINDRONE-ETH ESTRADIOL 1-35 MG-MCG PO TABS: 1.0000 | ORAL_TABLET | Freq: Every day | ORAL | Status: DC

## 2010-12-06 NOTE — Progress Notes (Signed)
  Subjective:    Patient ID: [name] Young, female    DOB: 1970/11/26, 40 y.o.   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  She has decided to begin a dietary program and is stopped.  Her evening insulin because her blood sugar at night with drop in the 40s.  She's currently taking 40 units of Lantus daily, along with her Glucotrol and Glucophage b.i.d.  She's lost 6 pounds in the past 4 weeks.  She takes her lisinopril daily.  She also takes her Zocor 20 nightly along with an aspirin tablet for hyperlipidemia.  Lipids are goal.  Her eye exam was in December 2010.  She forgot to go this past December.  Recommended yearly eye exams.  She gets routine dental care, BSE monthly, annual mammography, the vaccinations up to date    Review of Systems  Constitutional: Negative.   HENT: Negative.   Eyes: Negative.   Respiratory: Negative.   Cardiovascular: Negative.   Gastrointestinal: Negative.   Genitourinary: Negative.   Musculoskeletal: Negative.   Neurological: Negative.   Hematological: Negative.   Psychiatric/Behavioral: Negative.        Objective:   Physical Exam  Constitutional: She appears well-developed and well-nourished.  HENT:  Head: Normocephalic and atraumatic.  Right Ear: External ear normal.  Left Ear: External ear normal.  Nose: Nose normal.  Mouth/Throat: Oropharynx is clear and moist.  Eyes: EOM are normal. Pupils are equal, round, and reactive to light.  Neck: Normal range of motion. Neck supple. No thyromegaly present.  Cardiovascular: Normal rate, regular rhythm, normal heart sounds and intact distal pulses.  Exam reveals no gallop and no friction rub.   No murmur heard. Pulmonary/Chest: Effort normal and breath sounds normal.  Abdominal: Soft. Bowel sounds are normal. She exhibits no distension and no mass. There is no tenderness. There  is no rebound.  Genitourinary: Vagina normal and uterus normal. Guaiac negative stool. No vaginal discharge found.       Bilateral breast exam normal  Musculoskeletal: Normal range of motion.  Lymphadenopathy:    She has no cervical adenopathy.  Neurological: She is alert. She has normal reflexes. No cranial nerve deficit. She exhibits normal muscle tone. Coordination normal.  Skin: Skin is warm and dry.  Psychiatric: She has a normal mood and affect. Her behavior is normal. Judgment and thought content normal.          Assessment & Plan:  Healthy female.  Diabetes type 1, and monitor blood sugar carefully decrease insulin from 40 to 35 ACC hypoglycemia.  Continue the  exercise program.  Continue the Zocor and aspirin for hyperlipidemia.  Remember to get her annual mammogram and annual eye exam.  Return in 42 weeks for follow-up

## 2010-12-06 NOTE — Patient Instructions (Signed)
Since you have changed your diet, continue to hold the evening dose of insulin however, take the 40 units of Lantus in the morning.  If, however, he developed episodes of hypoglycemia, then cut the insulin down to 35 units daily.  Since you have started this dietary program.  Monitor your blood sugar carefully and see me in 4 weeks for follow-up, sooner if any problems.  Walk 15 minutes daily.  Schedule your exam.  15 minutes of back exercises daily in the morning

## 2010-12-27 ENCOUNTER — Ambulatory Visit: Payer: Commercial Managed Care - PPO | Admitting: Family Medicine

## 2011-01-30 ENCOUNTER — Encounter: Payer: Self-pay | Admitting: Family Medicine

## 2011-01-30 ENCOUNTER — Ambulatory Visit (INDEPENDENT_AMBULATORY_CARE_PROVIDER_SITE_OTHER): Payer: 59 | Admitting: Family Medicine

## 2011-01-30 DIAGNOSIS — E109 Type 1 diabetes mellitus without complications: Secondary | ICD-10-CM

## 2011-01-30 DIAGNOSIS — E785 Hyperlipidemia, unspecified: Secondary | ICD-10-CM

## 2011-01-30 LAB — BASIC METABOLIC PANEL
Calcium: 9.3 mg/dL (ref 8.4–10.5)
GFR: 69.23 mL/min (ref >60.00)
Potassium: 3.6 mEq/L (ref 3.5–5.1)
Sodium: 138 mEq/L (ref 135–145)

## 2011-01-30 LAB — HEMOGLOBIN A1C: Hgb A1c MFr Bld: 6.6 % — ABNORMAL HIGH (ref 4.6–6.5)

## 2011-01-30 LAB — LIPID PANEL
HDL: 55.6 mg/dL (ref >39.00)
Triglycerides: 189 mg/dL — ABNORMAL HIGH (ref 0.0–149.0)
VLDL: 37.8 mg/dL (ref 0.0–40.0)

## 2011-01-30 NOTE — Patient Instructions (Signed)
Continue your diet and exercise program.  I will call you when we get your lab work back.  Return in 3 months, sooner if any problems

## 2011-01-30 NOTE — Progress Notes (Signed)
  Subjective:    Patient ID: [name] Young, female    DOB: [birth year], 40 y.o.   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  She is currently taking glipizide 10 mg b.i.d., metformin 1000 mg b.i.d., but has cut her insulin from 76 units daily, down to 20 by a combination of diet, exercise and weight loss.  She is going to [employer] and has lost 25 pounds and is exercising on a daily basis.  She feels good.  No complaints.  BP 102/68, which is not lightheaded when she stands a    Review of Systems    General and metabolic review of systems otherwise negative Objective:   Physical Exam    Well-developed well-nourished, female, in no acute distress    Assessment & Plan:  Diabetes type 1, under improved control with diet, exercise and weight loss.  Check A1c  History of hyperlipidemia.  Check lipids.

## 2011-02-02 ENCOUNTER — Telehealth: Payer: Self-pay | Admitting: *Deleted

## 2011-02-02 NOTE — Telephone Encounter (Signed)
Notified pt of Dr. Nelida Meuse recommendations.

## 2011-02-02 NOTE — Telephone Encounter (Signed)
Pt has been exercising and watching what she is eating.  BP had gone down to 102/67 and as low as 91/70.  Should she continue Lisinopril?

## 2011-02-02 NOTE — Telephone Encounter (Signed)
Decrease the lisinopril to 5 mg Monday, Wednesday, Friday, if BP remains low, then stop it completely

## 2011-03-17 ENCOUNTER — Other Ambulatory Visit: Payer: Self-pay | Admitting: Family Medicine

## 2011-03-17 ENCOUNTER — Telehealth: Payer: Self-pay | Admitting: Family Medicine

## 2011-03-17 DIAGNOSIS — Z1231 Encounter for screening mammogram for malignant neoplasm of breast: Secondary | ICD-10-CM

## 2011-03-17 DIAGNOSIS — E785 Hyperlipidemia, unspecified: Secondary | ICD-10-CM

## 2011-03-17 DIAGNOSIS — E109 Type 1 diabetes mellitus without complications: Secondary | ICD-10-CM

## 2011-03-17 NOTE — Telephone Encounter (Signed)
Pt is coming in end of November for her 3 month follow up said she normally comes in a week before to get blood work done. Do we need to schedule this? She has also requested to have a lipid panel done. Pt requesting you contact her about what lab work would be done.

## 2011-03-20 NOTE — Telephone Encounter (Signed)
Fleet Contras please call [facility] and be sure she gets all her labs done.  A1c, microalbumin, etc. Prior to physical exam

## 2011-03-22 NOTE — Telephone Encounter (Signed)
Left message on machine for patient  To return our call 

## 2011-03-24 NOTE — Telephone Encounter (Signed)
Addended by: [name] B on: 03/24/2011 09:56 AM   Modules accepted: Orders

## 2011-04-06 ENCOUNTER — Encounter: Payer: Self-pay | Admitting: Family Medicine

## 2011-04-08 ENCOUNTER — Inpatient Hospital Stay (INDEPENDENT_AMBULATORY_CARE_PROVIDER_SITE_OTHER)
Admission: RE | Admit: 2011-04-08 | Discharge: 2011-04-08 | Disposition: A | Discharge status: Home / Self Care | Payer: 59 | Source: Ambulatory Visit | Attending: Family Medicine | Admitting: Family Medicine

## 2011-04-08 DIAGNOSIS — J309 Allergic rhinitis, unspecified: Secondary | ICD-10-CM

## 2011-04-08 DIAGNOSIS — J029 Acute pharyngitis, unspecified: Secondary | ICD-10-CM

## 2011-04-08 DIAGNOSIS — J069 Acute upper respiratory infection, unspecified: Secondary | ICD-10-CM

## 2011-04-17 LAB — POCT RAPID STREP A: Streptococcus, Group A Screen (Direct): POSITIVE — AB

## 2011-04-25 ENCOUNTER — Ambulatory Visit
Admission: RE | Admit: 2011-04-25 | Discharge: 2011-04-25 | Disposition: A | Discharge status: Home / Self Care | Payer: 59 | Source: Ambulatory Visit | Attending: Family Medicine | Admitting: Family Medicine

## 2011-04-25 ENCOUNTER — Ambulatory Visit: Payer: 59

## 2011-04-25 DIAGNOSIS — Z1231 Encounter for screening mammogram for malignant neoplasm of breast: Secondary | ICD-10-CM

## 2011-06-06 ENCOUNTER — Ambulatory Visit: Payer: 59 | Admitting: Family Medicine

## 2011-06-07 ENCOUNTER — Other Ambulatory Visit (INDEPENDENT_AMBULATORY_CARE_PROVIDER_SITE_OTHER): Payer: 59

## 2011-06-07 DIAGNOSIS — E785 Hyperlipidemia, unspecified: Secondary | ICD-10-CM

## 2011-06-07 DIAGNOSIS — E109 Type 1 diabetes mellitus without complications: Secondary | ICD-10-CM

## 2011-06-07 LAB — BASIC METABOLIC PANEL
CO2: 25 mEq/L (ref 19–32)
Calcium: 9.1 mg/dL (ref 8.4–10.5)
Chloride: 104 mEq/L (ref 96–112)
Potassium: 4.1 mEq/L (ref 3.5–5.1)
Sodium: 139 mEq/L (ref 135–145)

## 2011-06-07 LAB — LIPID PANEL
Cholesterol: 205 mg/dL — ABNORMAL HIGH (ref 0–200)
HDL: 54.5 mg/dL (ref >39.00)
Total CHOL/HDL Ratio: 4
Triglycerides: 230 mg/dL — ABNORMAL HIGH (ref 0.0–149.0)
VLDL: 46 mg/dL — ABNORMAL HIGH (ref 0.0–40.0)

## 2011-06-07 LAB — LDL CHOLESTEROL, DIRECT: Direct LDL: 113.9 mg/dL

## 2011-06-13 ENCOUNTER — Encounter: Payer: Self-pay | Admitting: Family Medicine

## 2011-06-13 ENCOUNTER — Ambulatory Visit (INDEPENDENT_AMBULATORY_CARE_PROVIDER_SITE_OTHER): Payer: 59 | Admitting: Family Medicine

## 2011-06-13 DIAGNOSIS — E109 Type 1 diabetes mellitus without complications: Secondary | ICD-10-CM

## 2011-06-13 DIAGNOSIS — E785 Hyperlipidemia, unspecified: Secondary | ICD-10-CM

## 2011-06-13 MED ORDER — LISINOPRIL 5 MG PO TABS: 5.0000 mg | ORAL_TABLET | Freq: Every day | ORAL | Status: DC

## 2011-06-13 MED ORDER — SIMVASTATIN 20 MG PO TABS: 20.0000 mg | ORAL_TABLET | Freq: Every day | ORAL | Status: DC

## 2011-06-13 NOTE — Progress Notes (Signed)
  Subjective:    Patient ID: [name] Young, female    DOB: Nov 10, 1970, 40 y.o.   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  She has decreased her insulin down to 10 units a day.  Her A1c is 6.4%.  She wanted to see if her lipids would normalize once her blood sugars stabilized.  Therefore, she stopped her Zocor.  However, her LDL has gone up from 78 to 113, and encouraged to restart the statin.  Also, she stopped her ACE inhibitor because her blood pressures dropping too low.  She was only on 5 mg a day.  Advised to restart at 2.5 mg a day for renal protection   Review of Systems    General metabolic review of systems otherwise negative Objective:   Physical Exam Well-developed well-nourished, female, in no acute distress.  BP right arm sitting position 130/80       Assessment & Plan:  Diabetes type I.  Markedly improved control with diet, exercise and weight loss.  Plan continue current therapy.  Add the statin follow-up lipids in two months.  CTX in June.  Restart the ACE inhibitor 2.5 mg Monday, Wednesday, Friday, monitor, blood pressure

## 2011-06-13 NOTE — Patient Instructions (Signed)
Decrease your insulin to 8 units daily.  Restart the lisinopril 5 mg.......... One half tablet Monday, Wednesday, Friday.  Restart the Zocor, 20 mg a day at bedtime.  Follow-up lipid and l liver panel in two months.  Return in June for your annual physical examination

## 2011-06-16 ENCOUNTER — Other Ambulatory Visit: Payer: Self-pay | Admitting: *Deleted

## 2011-06-16 MED ORDER — GLUCOSE BLOOD VI STRP: ORAL_STRIP | Status: DC

## 2011-08-11 ENCOUNTER — Other Ambulatory Visit (INDEPENDENT_AMBULATORY_CARE_PROVIDER_SITE_OTHER): Payer: 59

## 2011-08-11 DIAGNOSIS — E785 Hyperlipidemia, unspecified: Secondary | ICD-10-CM

## 2011-08-11 DIAGNOSIS — E109 Type 1 diabetes mellitus without complications: Secondary | ICD-10-CM

## 2011-08-11 LAB — HEPATIC FUNCTION PANEL
ALT: 21 U/L (ref 0–35)
Total Bilirubin: 0.2 mg/dL — ABNORMAL LOW (ref 0.3–1.2)

## 2011-08-11 LAB — LIPID PANEL
HDL: 54.4 mg/dL (ref >39.00)
LDL Cholesterol: 89 mg/dL (ref 0–99)
VLDL: 28 mg/dL (ref 0.0–40.0)

## 2011-10-31 ENCOUNTER — Other Ambulatory Visit: Payer: Self-pay | Admitting: *Deleted

## 2011-10-31 MED ORDER — NORETHINDRONE-ETH ESTRADIOL 1-35 MG-MCG PO TABS: 1.0000 | ORAL_TABLET | Freq: Every day | ORAL | Status: DC

## 2011-11-08 ENCOUNTER — Telehealth: Payer: Self-pay | Admitting: *Deleted

## 2011-11-08 NOTE — Telephone Encounter (Signed)
Decrease metformin to 500 mg twice daily,,,,,,,,,,, if after one week blood sugar is still low then decrease metformin to 500 mg once daily in the morning before breakfast

## 2011-11-08 NOTE — Telephone Encounter (Signed)
Pt is having BSs running as low as the 40's some nights.  She has stopped the Lantus, but needs some direction on the other meds???

## 2011-11-08 NOTE — Telephone Encounter (Signed)
Patient is aware 

## 2011-12-12 ENCOUNTER — Ambulatory Visit (INDEPENDENT_AMBULATORY_CARE_PROVIDER_SITE_OTHER): Payer: 59 | Admitting: Family Medicine

## 2011-12-12 ENCOUNTER — Encounter: Payer: Self-pay | Admitting: Family Medicine

## 2011-12-12 ENCOUNTER — Telehealth: Payer: Self-pay

## 2011-12-12 VITALS — BP 110/80 | Temp 98.8°F | Wt 150.0 lb

## 2011-12-12 DIAGNOSIS — B009 Herpesviral infection, unspecified: Secondary | ICD-10-CM

## 2011-12-12 DIAGNOSIS — D2370 Other benign neoplasm of skin of unspecified lower limb, including hip: Secondary | ICD-10-CM

## 2011-12-12 DIAGNOSIS — T7840XA Allergy, unspecified, initial encounter: Secondary | ICD-10-CM

## 2011-12-12 DIAGNOSIS — E109 Type 1 diabetes mellitus without complications: Secondary | ICD-10-CM

## 2011-12-12 DIAGNOSIS — D2371 Other benign neoplasm of skin of right lower limb, including hip: Secondary | ICD-10-CM

## 2011-12-12 MED ORDER — ACYCLOVIR 400 MG PO TABS: ORAL_TABLET | ORAL | Status: DC

## 2011-12-12 MED ORDER — FLUTICASONE PROPIONATE 50 MCG/ACT NA SUSP: NASAL | Status: DC

## 2011-12-12 NOTE — Patient Instructions (Signed)
Take plain Claritin 10 mg in the steroid nasal spray one shot each nostril at bedtime  We will call you within 2 weeks for your path report on the lesion we removed  Decrease the metformin only take 500 mg prior to your evening meal and affect are a week you continue to have low sugars at night cut the Glucotrol to 5 mg prior to your evening meal

## 2011-12-12 NOTE — Progress Notes (Signed)
  Subjective:    Patient ID: [name] Young, female    DOB: 1970/10/20, 41 y.o.   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 She's currently on Glucotrol 10 mg twice a day and metformin 1000 mg twice a day however this is still causing her to have hypoglycemia none overnight 3-4 times per week.  Also for the last 3 weeks her allergies referred up she's had head congestion cough postnasal drip also fever blister in the left side of her mouth  She also has a black lesion on the inner side of her right thigh   Review of Systems Gen. metabolic dermatologic review of systems otherwise negative    Objective:   Physical Exam Well-developed well-nourished female in no acute distress HEENT negative neck was supple no adenopathy except for some swollen lymph nodes on the left side where the fever blisters on the left side of her cheek and inside her mouth lungs are clear to auscultation  The black lesion on the right side of her inner thigh was removed it  Measured 8 mm x 8 mm,,,,,,, after informed consent the lesion was anesthetized with 1% Xylocaine with epinephrine and removed with 3 mm margins. It was sent for pathologic analysis. The bases cauterized Band-Aids was applied no complications. Clinically it appears to be a dysplastic nevus rule out melanoma       Assessment & Plan:  Diabetes type 2 decrease metformin only take 500 mg prior to evening the if blood sugar continues to drop in the evening and cut the Glucotrol from 10 mg to 5  Switch to plain Claritin and add the steroid nasal spray one shot each nostril at bedtime  With in 2 weeks we will call you with the path report

## 2011-12-12 NOTE — Telephone Encounter (Signed)
Pt c/o cold/sinus symptoms off and on for the past 4 weeks.  Will get better for a day or two, but then gets sick again and now has a swollen gland in her mouth that is concerning to her.  Scheduled with Dr. Tawanna Cooler for 3:30 today.

## 2011-12-12 NOTE — Progress Notes (Signed)
Addended by: [name] B on: 12/12/2011 04:59 PM   Modules accepted: Orders

## 2011-12-20 ENCOUNTER — Other Ambulatory Visit: Payer: Self-pay | Admitting: Family Medicine

## 2011-12-22 ENCOUNTER — Other Ambulatory Visit: Payer: Self-pay | Admitting: Family Medicine

## 2012-01-01 ENCOUNTER — Other Ambulatory Visit: Payer: Self-pay | Admitting: Family Medicine

## 2012-01-25 ENCOUNTER — Other Ambulatory Visit (INDEPENDENT_AMBULATORY_CARE_PROVIDER_SITE_OTHER): Payer: 59

## 2012-01-25 DIAGNOSIS — Z0000 Encounter for general adult medical examination without abnormal findings: Secondary | ICD-10-CM

## 2012-01-25 LAB — HEPATIC FUNCTION PANEL
ALT: 18 U/L (ref 0–35)
AST: 20 U/L (ref 0–37)
Albumin: 3.9 g/dL (ref 3.5–5.2)
Alkaline Phosphatase: 42 U/L (ref 39–117)
Total Protein: 6.9 g/dL (ref 6.0–8.3)

## 2012-01-25 LAB — POCT URINALYSIS DIPSTICK
Bilirubin, UA: NEGATIVE
Ketones, UA: NEGATIVE
Leukocytes, UA: NEGATIVE

## 2012-01-25 LAB — CBC WITH DIFFERENTIAL/PLATELET
Eosinophils Relative: 6.9 % — ABNORMAL HIGH (ref 0.0–5.0)
HCT: 38.6 % (ref 36.0–46.0)
Hemoglobin: 12.9 g/dL (ref 12.0–15.0)
Lymphs Abs: 2.1 10*3/uL (ref 0.7–4.0)
Monocytes Relative: 6.5 % (ref 3.0–12.0)
Neutro Abs: 2.5 10*3/uL (ref 1.4–7.7)
RDW: 13.7 % (ref 11.5–14.6)
WBC: 5.3 10*3/uL (ref 4.5–10.5)

## 2012-01-25 LAB — BASIC METABOLIC PANEL
GFR: 86.74 mL/min (ref >60.00)
Glucose, Bld: 119 mg/dL — ABNORMAL HIGH (ref 70–99)
Potassium: 4.1 mEq/L (ref 3.5–5.1)
Sodium: 137 mEq/L (ref 135–145)

## 2012-01-25 LAB — TSH: TSH: 0.81 u[IU]/mL (ref 0.35–5.50)

## 2012-02-01 ENCOUNTER — Encounter: Payer: 59 | Admitting: Family Medicine

## 2012-02-06 ENCOUNTER — Encounter: Payer: Self-pay | Admitting: Family Medicine

## 2012-02-06 ENCOUNTER — Ambulatory Visit (INDEPENDENT_AMBULATORY_CARE_PROVIDER_SITE_OTHER): Payer: 59 | Admitting: Family Medicine

## 2012-02-06 ENCOUNTER — Other Ambulatory Visit (HOSPITAL_COMMUNITY)
Admission: RE | Admit: 2012-02-06 | Discharge: 2012-02-06 | Disposition: A | Discharge status: Home / Self Care | Payer: 59 | Source: Ambulatory Visit | Attending: Family Medicine | Admitting: Family Medicine

## 2012-02-06 VITALS — BP 110/70 | Temp 98.5°F | Ht 65.25 in | Wt 151.0 lb

## 2012-02-06 DIAGNOSIS — T7840XA Allergy, unspecified, initial encounter: Secondary | ICD-10-CM

## 2012-02-06 DIAGNOSIS — N938 Other specified abnormal uterine and vaginal bleeding: Secondary | ICD-10-CM

## 2012-02-06 DIAGNOSIS — B009 Herpesviral infection, unspecified: Secondary | ICD-10-CM

## 2012-02-06 DIAGNOSIS — I499 Cardiac arrhythmia, unspecified: Secondary | ICD-10-CM

## 2012-02-06 DIAGNOSIS — Z01419 Encounter for gynecological examination (general) (routine) without abnormal findings: Secondary | ICD-10-CM

## 2012-02-06 DIAGNOSIS — E785 Hyperlipidemia, unspecified: Secondary | ICD-10-CM

## 2012-02-06 DIAGNOSIS — E109 Type 1 diabetes mellitus without complications: Secondary | ICD-10-CM

## 2012-02-06 DIAGNOSIS — N949 Unspecified condition associated with female genital organs and menstrual cycle: Secondary | ICD-10-CM

## 2012-02-06 MED ORDER — SIMVASTATIN 20 MG PO TABS: 20.0000 mg | ORAL_TABLET | Freq: Every day | ORAL | Status: DC

## 2012-02-06 MED ORDER — NORETHINDRONE-ETH ESTRADIOL 1-35 MG-MCG PO TABS: 1.0000 | ORAL_TABLET | Freq: Every day | ORAL | Status: DC

## 2012-02-06 MED ORDER — FLUTICASONE PROPIONATE 50 MCG/ACT NA SUSP: NASAL | Status: DC

## 2012-02-06 MED ORDER — METFORMIN HCL 1000 MG PO TABS: 1000.0000 mg | ORAL_TABLET | Freq: Two times a day (BID) | ORAL | Status: DC

## 2012-02-06 MED ORDER — GLUCOSE BLOOD VI STRP: ORAL_STRIP | Status: DC

## 2012-02-06 MED ORDER — ACYCLOVIR 400 MG PO TABS: ORAL_TABLET | ORAL | Status: DC

## 2012-02-06 MED ORDER — LISINOPRIL 5 MG PO TABS: 5.0000 mg | ORAL_TABLET | Freq: Every day | ORAL | Status: DC

## 2012-02-06 MED ORDER — GLIPIZIDE 10 MG PO TABS: 10.0000 mg | ORAL_TABLET | Freq: Two times a day (BID) | ORAL | Status: DC

## 2012-02-06 NOTE — Patient Instructions (Addendum)
Continue your good health habits  Followup blood sugar in 6 months since your sugars are stable  Return sometime in the next month to remove the 2 lesions on your back

## 2012-02-06 NOTE — Progress Notes (Signed)
  Subjective:    Patient ID: [name] Young, female    DOB: 1970-08-17, 41 y.o.   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 She has achieved her weight loss goal down to 150 recent hemoglobin A1c 6.6% normal. She takes her BCPs periods normal  She takes Zocor 20 mg daily along with aspirin tablet   Review of Systems  Constitutional: Negative.   HENT: Negative.   Eyes: Negative.   Respiratory: Negative.   Cardiovascular: Negative.   Gastrointestinal: Negative.   Genitourinary: Negative.   Musculoskeletal: Negative.   Neurological: Negative.   Hematological: Negative.   Psychiatric/Behavioral: Negative.        Objective:   Physical Exam  Constitutional: She appears well-developed and well-nourished.  HENT:  Head: Normocephalic and atraumatic.  Right Ear: External ear normal.  Left Ear: External ear normal.  Nose: Nose normal.  Mouth/Throat: Oropharynx is clear and moist.  Eyes: EOM are normal. Pupils are equal, round, and reactive to light.  Neck: Normal range of motion. Neck supple. No thyromegaly present.  Cardiovascular: Normal rate, regular rhythm, normal heart sounds and intact distal pulses.  Exam reveals no gallop and no friction rub.   No murmur heard. Pulmonary/Chest: Effort normal and breath sounds normal.  Abdominal: Soft. Bowel sounds are normal. She exhibits no distension and no mass. There is no tenderness. There is no rebound.  Genitourinary: Vagina normal and uterus normal. Guaiac negative stool. No vaginal discharge found.  Musculoskeletal: Normal range of motion.  Lymphadenopathy:    She has no cervical adenopathy.  Neurological: She  is alert. She has normal reflexes. No cranial nerve deficit. She exhibits normal muscle tone. Coordination normal.  Skin: Skin is warm and dry.       Total body skin exam normal except for 2 black lesions posterior lumbar area advised to return for removal  Psychiatric: She has a normal mood and affect. Her behavior is normal. Judgment and thought content normal.          Assessment & Plan:  Healthy female  Diabetes type 2 continue current meds  Allergic rhinitis continue current meds  The view be continue BCPs  Hyperlipidemia continue Zocor  Continue lisinopril 5 mg daily for renal protection  History of dysplastic nevi returns to remove the 2 lesions on her back

## 2012-03-05 ENCOUNTER — Encounter: Payer: Self-pay | Admitting: Family Medicine

## 2012-03-05 ENCOUNTER — Ambulatory Visit (INDEPENDENT_AMBULATORY_CARE_PROVIDER_SITE_OTHER): Payer: 59 | Admitting: Family Medicine

## 2012-03-05 DIAGNOSIS — D235 Other benign neoplasm of skin of trunk: Secondary | ICD-10-CM

## 2012-03-05 NOTE — Progress Notes (Signed)
  Subjective:    Patient ID: [name] Young, female    DOB: 1970/12/03, 41 y.o.   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 We noted 2 lesions on her back that needs removed  Lesion #1 is a 5 mm x5 mm lesion to the right of L1  Lesion #2 is 7 mm x 7 mm midline L4  Both lesions were anesthetized with 1% Xylocaine with epinephrine. They were excised with 3 mm margins. No complications. She tolerated the procedure. The lesions were sent for pathologic analysis  Review of Systems General and dermatologic review of systems otherwise negative    Objective:   Physical Exam Procedures see above clinically appear to be dysplastic nevi       Assessment & Plan:

## 2012-03-05 NOTE — Patient Instructions (Signed)
Removed the Band-Aids in 24 hours I will call you a report within 2 weeks

## 2012-03-12 NOTE — Progress Notes (Signed)
Quick Note:  Left a message for return call. ______ 

## 2012-05-24 ENCOUNTER — Other Ambulatory Visit: Payer: Self-pay | Admitting: Family Medicine

## 2012-05-24 DIAGNOSIS — Z1231 Encounter for screening mammogram for malignant neoplasm of breast: Secondary | ICD-10-CM

## 2012-06-21 ENCOUNTER — Ambulatory Visit: Payer: 59

## 2012-07-01 ENCOUNTER — Ambulatory Visit (INDEPENDENT_AMBULATORY_CARE_PROVIDER_SITE_OTHER): Payer: Self-pay | Admitting: Family Medicine

## 2012-07-01 VITALS — BP 135/85 | HR 89 | Wt 153.1 lb

## 2012-07-01 DIAGNOSIS — E119 Type 2 diabetes mellitus without complications: Secondary | ICD-10-CM

## 2012-07-01 NOTE — Progress Notes (Signed)
Patient presents today for 3 month DM follow-up as part of the employer-sponsored Link to Wellness program. Medications and glucose readings have been reviewed. I have also discussed with patient lifestyle interventions such as healthy eating choices and physical activity. Details of this visit can be found in [facility] documenting program available through [facility] ([facility]). Patient has set a series of personal goals and will follow-up in 3 months for further review of DM.  

## 2012-07-08 ENCOUNTER — Ambulatory Visit
Admission: RE | Admit: 2012-07-08 | Discharge: 2012-07-08 | Disposition: A | Discharge status: Home / Self Care | Payer: 59 | Source: Ambulatory Visit | Attending: Family Medicine | Admitting: Family Medicine

## 2012-07-08 DIAGNOSIS — Z1231 Encounter for screening mammogram for malignant neoplasm of breast: Secondary | ICD-10-CM

## 2012-07-15 ENCOUNTER — Encounter: Payer: Self-pay | Admitting: Family Medicine

## 2012-07-15 NOTE — Progress Notes (Signed)
Patient ID: [name] Young, female   DOB: 1971/04/18, 42 y.o.   MRN: [id number] Reviewed:  Agree with documentation and management.

## 2012-07-16 ENCOUNTER — Other Ambulatory Visit: Payer: Self-pay | Admitting: Family Medicine

## 2012-07-16 DIAGNOSIS — R928 Other abnormal and inconclusive findings on diagnostic imaging of breast: Secondary | ICD-10-CM

## 2012-07-25 ENCOUNTER — Ambulatory Visit
Admission: RE | Admit: 2012-07-25 | Discharge: 2012-07-25 | Disposition: A | Discharge status: Home / Self Care | Payer: 59 | Source: Ambulatory Visit | Attending: Family Medicine | Admitting: Family Medicine

## 2012-07-25 DIAGNOSIS — R928 Other abnormal and inconclusive findings on diagnostic imaging of breast: Secondary | ICD-10-CM

## 2012-08-01 ENCOUNTER — Other Ambulatory Visit: Payer: 59

## 2012-08-02 ENCOUNTER — Other Ambulatory Visit (INDEPENDENT_AMBULATORY_CARE_PROVIDER_SITE_OTHER): Payer: 59

## 2012-08-02 DIAGNOSIS — E109 Type 1 diabetes mellitus without complications: Secondary | ICD-10-CM

## 2012-08-02 LAB — BASIC METABOLIC PANEL
Calcium: 9 mg/dL (ref 8.4–10.5)
GFR: 81.62 mL/min (ref >60.00)
Glucose, Bld: 130 mg/dL — ABNORMAL HIGH (ref 70–99)
Sodium: 138 mEq/L (ref 135–145)

## 2012-08-02 LAB — HEMOGLOBIN A1C: Hgb A1c MFr Bld: 6.9 % — ABNORMAL HIGH (ref 4.6–6.5)

## 2012-08-06 ENCOUNTER — Ambulatory Visit (INDEPENDENT_AMBULATORY_CARE_PROVIDER_SITE_OTHER): Payer: 59 | Admitting: Family Medicine

## 2012-08-06 ENCOUNTER — Encounter: Payer: Self-pay | Admitting: Family Medicine

## 2012-08-06 VITALS — BP 124/80 | Temp 98.5°F | Wt 155.0 lb

## 2012-08-06 DIAGNOSIS — E109 Type 1 diabetes mellitus without complications: Secondary | ICD-10-CM

## 2012-08-06 NOTE — Patient Instructions (Signed)
Continue your current treatment program  Followup mid July for your annual exam nonfasting labs one week prior

## 2012-08-06 NOTE — Progress Notes (Signed)
  Subjective:    Patient ID: [name] Young, female    DOB: [birth year], 42 y.o.   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 She says she feels well no complications from her medication no side effects.   Review of Systems Review of systems otherwise negative    Objective:   Physical Exam Well-developed well-nourished female no acute distress       Assessment & Plan:  Diabetes type 2 continue current therapy followup CPX in the summer

## 2012-08-08 ENCOUNTER — Ambulatory Visit: Payer: 59 | Admitting: Family Medicine

## 2012-09-09 ENCOUNTER — Ambulatory Visit (INDEPENDENT_AMBULATORY_CARE_PROVIDER_SITE_OTHER): Payer: Self-pay | Admitting: Family Medicine

## 2012-09-09 VITALS — BP 122/78 | HR 91 | Ht 67.0 in | Wt 151.0 lb

## 2012-09-09 DIAGNOSIS — E119 Type 2 diabetes mellitus without complications: Secondary | ICD-10-CM

## 2012-09-09 NOTE — Progress Notes (Signed)
Patient presents today for 3 month DM follow-up as part of the employer-sponsored Link to Wellness program. Medications and glucose readings have been reviewed. I have also discussed with patient lifestyle interventions such as healthy eating choices and physical activity. Details of this visit can be found in [facility] documenting program available through [facility] ([facility]). Patient has set a series of personal goals and will follow-up in 3 months for further review of DM.  

## 2012-09-30 NOTE — Progress Notes (Signed)
Patient ID: [name] Young, female   DOB: 1970-09-08, 42 y.o.   MRN: [id number] ATTENDING PHYSICIAN NOTE: I have reviewed the chart and agree with the plan as detailed above. Denny Levy MD Pager [phone]

## 2012-12-27 ENCOUNTER — Ambulatory Visit (INDEPENDENT_AMBULATORY_CARE_PROVIDER_SITE_OTHER): Payer: 59 | Admitting: Family Medicine

## 2012-12-27 VITALS — Ht 67.0 in | Wt 153.0 lb

## 2012-12-27 DIAGNOSIS — E119 Type 2 diabetes mellitus without complications: Secondary | ICD-10-CM

## 2012-12-27 NOTE — Progress Notes (Signed)
Subjective:  Patient presents today for 3 month diabetes follow-up as part of the employer-sponsored Link to Wellness program. Current diabetes regimen includes glipizide 10 mg twice daily & metformin 1000 mg twice daily. Patient also continues on daily ACEi, and statin. Patient has a pending appt for July with Dr. Tawanna Cooler. No med changes or major health changes at this time.   Patient states that she gained a few pounds after dad passed away in 2022/12/03.     Disease Assessments:  Diabetes:  Type of Diabetes: Type 2; Year of diagnosis 12/03/2006; Sees Diabetes provider 2 times per year; MD managing Diabetes Dr. Kelle Darting; uses glucometer; takes medications as prescribed; does not take an aspirin a day; checks feet daily; checks blood glucose 2-6 times a week; hypoglycemia frequency seldom;   Highest CBG 208; Lowest CBG 60; [patient reported; she did not bring her meter with her today]  Other Diabetes History: She is checking her blood sugar most mornings, fasting. She sometimes forgets to check her CBG on the mornings that she works. She forgot to take her medication last night and this morning her FBG was 208.   She is having occasional low blood sugars after going on long runs. She is checking blood sugar before she runs and she has a snack with her.  A1C today was 6.5%. This is a decrease from 6.9% in January.    Physical Activity- she states that it has been hit or miss. Last month she was doing really well- she is doing zumba, some circuit training. Some biking. She estimates that she is getting a few days a week of physical activity.   Nutrition- still following weight watchers and she states that it is getting stricter with her diet. Still incorporating a large amount of fruits and vegetables.     Vital Signs:  12/27/2012 11:01 AM (EST) BMI 24.0; Height 5 ft 7 in; Weight 153 lbs    Testing:  Blood Sugar Tests:  Hemoglobin A1c: 6.5     Assessment/Plan: Patient is a 42 year old female with  DM2 who is currently meeting A1C goal of <7% with an A1C of 6.5% today. I congratulated patient on her success and will fax Dr. Tawanna Cooler a copy of her lab results.   Patient gained some weight last month following the death of her father. She has lost a few of those pounds already and has made changes to be able to lose all of the weight. She is not consistently exercising though, and this could be improved. Patient states that she is tired when she gets off work. We discussed other physical activity she could particpate in that she could do even when she was tired after getting off.   I will follow up with patient in 3 months..    Goals for Next Visit-  1. Be consistent in checking your blood sugar each morning before you eat.  2. Increase physical activity- aim for 3-5 days a week. Aim for at least 150 minutes of physical activity each week.  3. Weight loss- 3 pound goal before your next visit with me.  Next appointment is Friday September 26th at 9:30 AM.

## 2013-01-01 ENCOUNTER — Telehealth: Payer: Self-pay | Admitting: Family Medicine

## 2013-01-01 DIAGNOSIS — E109 Type 1 diabetes mellitus without complications: Secondary | ICD-10-CM

## 2013-01-01 MED ORDER — GLIPIZIDE 10 MG PO TABS: 10.0000 mg | ORAL_TABLET | Freq: Two times a day (BID) | ORAL | Status: DC

## 2013-01-01 NOTE — Telephone Encounter (Signed)
Pt needs refill on glipizide 10 mg twice a day #60 call into [facility] out[facility] pharm. Pt has appt in July 2014

## 2013-01-02 ENCOUNTER — Other Ambulatory Visit: Payer: Self-pay | Admitting: *Deleted

## 2013-01-02 DIAGNOSIS — E109 Type 1 diabetes mellitus without complications: Secondary | ICD-10-CM

## 2013-01-28 ENCOUNTER — Other Ambulatory Visit (INDEPENDENT_AMBULATORY_CARE_PROVIDER_SITE_OTHER): Payer: 59

## 2013-01-28 DIAGNOSIS — E109 Type 1 diabetes mellitus without complications: Secondary | ICD-10-CM

## 2013-01-28 LAB — CBC WITH DIFFERENTIAL/PLATELET
Basophils Relative: 0.3 % (ref 0.0–3.0)
Eosinophils Relative: 4.1 % (ref 0.0–5.0)
Hemoglobin: 13.4 g/dL (ref 12.0–15.0)
Lymphocytes Relative: 25.9 % (ref 12.0–46.0)
MCV: 90.8 fl (ref 78.0–100.0)
Neutro Abs: 4.8 10*3/uL (ref 1.4–7.7)
Neutrophils Relative %: 63.3 % (ref 43.0–77.0)
RBC: 4.33 Mil/uL (ref 3.87–5.11)
WBC: 7.6 10*3/uL (ref 4.5–10.5)

## 2013-01-28 LAB — TSH: TSH: 1.43 u[IU]/mL (ref 0.35–5.50)

## 2013-01-28 LAB — LIPID PANEL
HDL: 64 mg/dL (ref >39.00)
LDL Cholesterol: 65 mg/dL (ref 0–99)
VLDL: 34.6 mg/dL (ref 0.0–40.0)

## 2013-01-28 LAB — POCT URINALYSIS DIPSTICK
Glucose, UA: NEGATIVE
Ketones, UA: NEGATIVE
Spec Grav, UA: 1.02

## 2013-01-28 LAB — BASIC METABOLIC PANEL
CO2: 32 mEq/L (ref 19–32)
Calcium: 10.1 mg/dL (ref 8.4–10.5)
Creatinine, Ser: 0.8 mg/dL (ref 0.4–1.2)
GFR: 86.34 mL/min (ref >60.00)

## 2013-01-28 LAB — HEPATIC FUNCTION PANEL
ALT: 18 U/L (ref 0–35)
Total Bilirubin: 0.3 mg/dL (ref 0.3–1.2)
Total Protein: 6.9 g/dL (ref 6.0–8.3)

## 2013-01-28 LAB — MICROALBUMIN / CREATININE URINE RATIO
Creatinine,U: 107.5 mg/dL
Microalb Creat Ratio: 0.1 mg/g (ref 0.0–30.0)
Microalb, Ur: 0.1 mg/dL (ref 0.0–1.9)

## 2013-01-28 LAB — HEMOGLOBIN A1C: Hgb A1c MFr Bld: 7.1 % — ABNORMAL HIGH (ref 4.6–6.5)

## 2013-02-04 ENCOUNTER — Encounter: Payer: 59 | Admitting: Family Medicine

## 2013-02-13 ENCOUNTER — Other Ambulatory Visit: Payer: Self-pay | Admitting: Family Medicine

## 2013-02-14 ENCOUNTER — Encounter: Payer: 59 | Admitting: Family

## 2013-02-25 ENCOUNTER — Encounter: Payer: Self-pay | Admitting: Family

## 2013-02-25 ENCOUNTER — Other Ambulatory Visit (HOSPITAL_COMMUNITY)
Admission: RE | Admit: 2013-02-25 | Discharge: 2013-02-25 | Disposition: A | Discharge status: Home / Self Care | Payer: 59 | Source: Ambulatory Visit | Attending: Family | Admitting: Family

## 2013-02-25 ENCOUNTER — Ambulatory Visit (INDEPENDENT_AMBULATORY_CARE_PROVIDER_SITE_OTHER): Payer: 59 | Admitting: Family

## 2013-02-25 VITALS — BP 120/80 | HR 74 | Ht 66.5 in | Wt 150.0 lb

## 2013-02-25 DIAGNOSIS — E119 Type 2 diabetes mellitus without complications: Secondary | ICD-10-CM

## 2013-02-25 DIAGNOSIS — Z01419 Encounter for gynecological examination (general) (routine) without abnormal findings: Secondary | ICD-10-CM | POA: Insufficient documentation

## 2013-02-25 DIAGNOSIS — Z0000 Encounter for general adult medical examination without abnormal findings: Secondary | ICD-10-CM

## 2013-02-25 DIAGNOSIS — E7800 Pure hypercholesterolemia, unspecified: Secondary | ICD-10-CM

## 2013-02-25 DIAGNOSIS — Z124 Encounter for screening for malignant neoplasm of cervix: Secondary | ICD-10-CM

## 2013-02-25 NOTE — Progress Notes (Signed)
Subjective:    Patient ID: [name] Young, female    DOB: 02-Dec-1970, 42 y.o.   MRN: [id number]  HPI This is a routine physical examination for this healthy  Female. Reviewed all health maintenance protocols including mammography and reviewed appropriate screening labs. Her immunization history was reviewed as well as her current medications and allergies refills of her chronic medications were given and the plan for yearly health maintenance was discussed all orders and referrals were made as appropriate.   Review of Systems  Constitutional: Negative.   HENT: Negative.   Eyes: Negative.   Respiratory: Negative.   Cardiovascular: Negative.   Gastrointestinal: Negative.   Endocrine: Negative.   Genitourinary: Negative.   Musculoskeletal: Negative.   Skin: Negative.   Allergic/Immunologic: Negative.   Neurological: Negative.   Hematological: Negative.   Psychiatric/Behavioral: Negative.    Past Medical History  Diagnosis Date  . Diabetes mellitus     Type II    History   Social History  . Marital Status: Married    Spouse Name: N/A    Number of Children: N/A  . Years of Education: N/A   Occupational History  . Not on file.   Social History Main Topics  . Smoking status: Never Smoker   . Smokeless tobacco: Not on file  . Alcohol Use: No  . Drug Use: No  . Sexual Activity: Not on file   Other Topics Concern  . Not on file   Social History Narrative   Regular exercise-No    History reviewed. No pertinent past surgical history.  Family History  Problem Relation Age of Onset  . Diabetes Other     Familly Hx First degree relative    Allergies  Allergen Reactions  . Penicillins     REACTION: RASH    Current Outpatient Prescriptions on File Prior to Visit  Medication Sig Dispense Refill  . acyclovir (ZOVIRAX) 400 MG tablet 2 by mouth twice a day  50 tablet  3  . cetirizine (ZYRTEC) 10 MG tablet Take 10 mg by mouth daily as needed.       . fluticasone  (FLONASE) 50 MCG/ACT nasal spray 1 shot up each nostril at bedtime  16 g  6  . fluticasone (FLONASE) 50 MCG/ACT nasal spray INSTILL 1 SPRAY IN EACH NOSTRIL AT BEDTIME  16 g  6  . glipiZIDE (GLUCOTROL) 10 MG tablet Take 1 tablet (10 mg total) by mouth 2 (two) times daily before a meal.  60 tablet  3  . glucose blood (ACCU-CHEK AVIVA) test strip Use as instructed  100 each  6  . lisinopril (PRINIVIL,ZESTRIL) 5 MG tablet Take 1 tablet (5 mg total) by mouth daily.  100 tablet  3  . metFORMIN (GLUCOPHAGE) 1000 MG tablet Take 1 tablet (1,000 mg total) by mouth 2 (two) times daily with a meal.  200 tablet  3  . Multiple Vitamins-Minerals (DIASENSE MULTIVITAMIN) TABS Take 1 tablet by mouth daily.        . norethindrone-ethinyl estradiol 1/35 (ORTHO-NOVUM, NORTREL,CYCLAFEM) tablet Take 1 tablet by mouth daily.  90 tablet  3  . simvastatin (ZOCOR) 20 MG tablet TAKE 1 TABLET BY MOUTH AT BEDTIME.  100 tablet  3   No current facility-administered medications on file prior to visit.    BP 120/80  Pulse 74  Ht 5' 6.5" (1.689 m)  Wt 150 lb (68.04 kg)  BMI 23.85 kg/m2  SpO2 98%  LMP 08/11/2014chart    Objective:   Physical Exam  Constitutional: She is oriented to person, place, and time. She appears well-developed and well-nourished.  HENT:  Head: Normocephalic.  Right Ear: External ear normal.  Left Ear: External ear normal.  Nose: Nose normal.  Mouth/Throat: Oropharynx is clear and moist.  Eyes: Conjunctivae and EOM are normal. Pupils are equal, round, and reactive to light.  Neck: Normal range of motion. Neck supple. No thyromegaly present.  Cardiovascular: Normal rate, regular rhythm and normal heart sounds.   Pulmonary/Chest: Effort normal and breath sounds normal.  Abdominal: Soft. Bowel sounds are normal. She exhibits no distension. There is no tenderness. There is no rebound.  Genitourinary: Vagina normal and uterus normal. Guaiac negative stool. No vaginal discharge found.   Musculoskeletal: Normal range of motion. She exhibits no edema and no tenderness.  Neurological: She is alert and oriented to person, place, and time. She has normal reflexes. She displays normal reflexes. No cranial nerve deficit. Coordination normal.  Skin: Skin is warm and dry. No erythema.  Psychiatric: She has a normal mood and affect.          Assessment & Plan:  Assessment:  1. CPX 2. Type 2 DM 3. Hypercholesterolemia  Plan: continue current medications. Exercise daily. Pap smear sent. Call the office with any questions or concerns. Recheck in 6 months and sooner as needed.

## 2013-02-25 NOTE — Patient Instructions (Signed)

## 2013-03-19 ENCOUNTER — Other Ambulatory Visit: Payer: Self-pay | Admitting: Family Medicine

## 2013-03-31 NOTE — Progress Notes (Signed)
Patient ID: [name] Young, female   DOB: January 09, 1971, 42 y.o.   MRN: [id number] ATTENDING PHYSICIAN NOTE: I have reviewed the chart and agree with the plan as detailed above. Denny Levy MD Pager [phone]

## 2013-04-07 ENCOUNTER — Other Ambulatory Visit: Payer: Self-pay | Admitting: Family Medicine

## 2013-04-14 ENCOUNTER — Other Ambulatory Visit: Payer: Self-pay | Admitting: Family Medicine

## 2013-04-16 ENCOUNTER — Other Ambulatory Visit: Payer: Self-pay | Admitting: *Deleted

## 2013-04-16 DIAGNOSIS — E109 Type 1 diabetes mellitus without complications: Secondary | ICD-10-CM

## 2013-04-16 MED ORDER — GLIPIZIDE 10 MG PO TABS: 10.0000 mg | ORAL_TABLET | Freq: Two times a day (BID) | ORAL | Status: DC

## 2013-04-21 ENCOUNTER — Ambulatory Visit (INDEPENDENT_AMBULATORY_CARE_PROVIDER_SITE_OTHER): Payer: Self-pay | Admitting: Family Medicine

## 2013-04-21 VITALS — BP 128/84 | HR 81 | Ht 67.0 in | Wt 150.4 lb

## 2013-04-21 DIAGNOSIS — E119 Type 2 diabetes mellitus without complications: Secondary | ICD-10-CM

## 2013-04-21 NOTE — Progress Notes (Signed)
Subjective:  Patient presents today for 3 month diabetes follow-up as part of the employer-sponsored Link to Wellness program. Current diabetes regimen includes metformin 1000 mg BID, glipizide 10 mg BID. Patient also continues on daily ACEi, and statin. No med changes or major health changes at this time.   She saw Dr. Nelida Meuse PA in August. No medication changes made.   Patient has lost 3 pounds since her lastappointment with me. She continues weight watchers.    Disease Assessments:  Diabetes:  Type of Diabetes: Type 2; Year of diagnosis 2008; Sees Diabetes provider 2 times per year; MD managing Diabetes Dr. Kelle Darting; uses glucometer; takes medications as prescribed; does not take an aspirin a day; checks feet daily; hypoglycemia frequency seldom;   Highest CBG 156; Lowest CBG 45; checks blood glucose once daily; Other Diabetes History: She is checking her blood sugar once daily in the morning. She forgot her meter today.   Patient reported highest- 156; lowest 40s. On average she estimates that her BG is in the 110s when she wakes up.      Physical Activity-   She states that she has slowed down over the past few months. It is still hit or miss. There's construction in her neighborhood and she hasn't been walking as much because of it.   She thinks she is exercising maybe once a week for about 20-30 minutes.   Nutrition- still following weight watchers and she states that it is getting stricter with her diet. Still incorporating a large amount of fruits and vegetables.     Dilated Eye Exam: 10/30/2012  Foot Exam: 08/02/2012  Other Preventive Care Notes: Dental Exam- May 2014 hot pending for benefits fair Wednesday     Testing:  Blood Sugar Tests:  Hemoglobin A1c: 7.1 resulted on 01/28/2013   Assessment/Plan: Patient is a 42 year old female with DM2. Most recent A1C was 7.1% in July which is slightly above goal of less than 7%. Patient's weight has dropped three pounds since  her last appointment with me. She continues to do well with weight watchers. She has, however stopped exercising. We spent part of the visit today discussing strategies to get her to start exercising again. Major barriers right now are construction in her neighborhood. Previously she was exercising several times a week when she was in the process of losing weight and admits she has just gotten out of the habit of exercising.   Follow up with patient in 3 months..    Goals for Next Visit-  1. Continue weight watchers. 2. Walking- try to get out after the construction crews are gone. Aim for 3 days a week. Try to get at least one day a week your zumba video on the days you are off. 3. Get your med-surg certification passed off.   Next appointment is Friday January 9th at 9:30 AM.

## 2013-05-08 ENCOUNTER — Other Ambulatory Visit: Payer: Self-pay | Admitting: Family Medicine

## 2013-05-15 ENCOUNTER — Other Ambulatory Visit: Payer: Self-pay

## 2013-09-05 ENCOUNTER — Other Ambulatory Visit: Payer: Self-pay | Admitting: Family Medicine

## 2013-09-08 ENCOUNTER — Ambulatory Visit (INDEPENDENT_AMBULATORY_CARE_PROVIDER_SITE_OTHER): Payer: Self-pay | Admitting: Family Medicine

## 2013-09-08 VITALS — BP 118/84 | HR 77 | Ht 67.0 in | Wt 156.4 lb

## 2013-09-08 DIAGNOSIS — E119 Type 2 diabetes mellitus without complications: Secondary | ICD-10-CM

## 2013-09-08 NOTE — Progress Notes (Signed)
Subjective:  Patient presents today for 3 month diabetes follow-up as part of the employer-sponsored Link to Wellness program. Current diabetes regimen includes metformin 1000 mg BID and glipizide 10 mg BID. Patient also continues on daily ACEi, and statin.   Patient states that she has "fallen off the wagon" and has been eating whatever she wanted. She also wasn't exercising until the past few weeks. Weight is up 6 pounds since her last appointment with me.   A few episodes of low blood sugar after exercise. She states that she thinks this was a combination of not eating as many carbohydrates and exercising.   Last appointment with Dr. Sherren Mocha was last year. She has no pending appointment with him. I will check A1C today.    Disease Assessments:  Diabetes:  Type of Diabetes: Type 2; Year of diagnosis 2008; Sees Diabetes provider 2 times per year; MD managing Diabetes Dr. Stevie Kern; uses glucometer; takes medications as prescribed; does not take an aspirin a day; checks feet daily; hypoglycemia frequency rarely;   Highest CBG 244; 7 day CBG average 124; 14 day CBG average 111; 30 day CBG average 112; checks blood glucose 2-6 times a week; Lowest CBG 49;   Other Diabetes History: Patient states that she is checking fasting 3-4 times a week and then when she feels like she is low.   Some episodes of hypoglycemia over the past 2 weeks because she has increased exercise intensity and is not eating as many carbohydrates.    Tobacco Assessment:  Smoking Status: Never smoker; Last Reviewed: 09/08/2013    Physical Activity-   She states she is doing better now. In December and January she wasn't exercising. For February she did better. She estimates that she is getting out 2-3 days a week for anywhere from 30-120 minutes. She is going back to the gym and doing a combination of cardio and weight training.   Nutrition- Patient admits that she was eating more and eating whatever she wanted to in  December/January. She was not following weight watchers. For the month of February she has gotten back into eating healthier. She isn't following weight watchers strictly, but she is generally following the eating habits that she was doing before.       Hemoglobin A1c: 09/08/2013 via POCT 6.8    Dilated Eye Exam: 10/30/2012  Flu vaccine: 04/09/2013  Foot Exam: 08/02/2012  Other Preventive Care Notes: Dental Exam- Fall 2014    Vital Signs:  09/08/2013 10:39 AM (EST) Blood Pressure 118 / 84 mm/HgBMI 24.5; Height 5 ft 7 in; Pulse Rate 77 bpm; Weight 156.4 lbs    Testing:  Blood Sugar Tests:  Hemoglobin A1c: 6.8 via POCT resulted on 09/08/2013    Assessment/Plan: Patient is a 43 year old female with DM2. A1C today is 6.8% and is meeting goal of less than 7%. She has gained 6 pounds since her last visit with me and states that in December and January she "fell off the wagon" and was eating whatever she wanted. She also wasn't exercising. Since noticed that she was gaining weight so she made some changes to her diet and started exercising again. In the past few weeks she has been getting physical activity 2-3 days a week for 30-90 minutes each time.   She has had 3 low blood sugar results in the past few weeks. Theses are occurring on days that she is exercising more intensely and also is not eating very many carbohydrates. It is usually happening  during the night. We discussed that on days she exercises making sure that she has 3-4 servings of carbohydrates at the next meal following exercise. We reviewed how to treat a low blood sugar.   I will fax A1C results to Dr. Honor Junes office. Follow up with patient in 5 months following my maternity leave..   Goals for Next Visit-  1. Continue physical activity- aim for 3 days a week, 150 minutes weekly.  2. Make healthy eating choices- cut back on high fat foods (includes cheese, olives, triscuits).  3. Make an appointment to see Dr. Sherren Mocha and also  make appointment mammogram.   Next appointment with me is Friday July 24th at 9 AM.

## 2013-09-12 ENCOUNTER — Other Ambulatory Visit: Payer: Self-pay

## 2013-09-12 DIAGNOSIS — Z1231 Encounter for screening mammogram for malignant neoplasm of breast: Secondary | ICD-10-CM

## 2013-09-25 ENCOUNTER — Ambulatory Visit
Admission: RE | Admit: 2013-09-25 | Discharge: 2013-09-25 | Disposition: A | Discharge status: Home / Self Care | Payer: 59 | Source: Ambulatory Visit

## 2013-09-25 DIAGNOSIS — Z1231 Encounter for screening mammogram for malignant neoplasm of breast: Secondary | ICD-10-CM

## 2013-10-01 ENCOUNTER — Emergency Department (HOSPITAL_COMMUNITY)
Admission: EM | Admit: 2013-10-01 | Discharge: 2013-10-01 | Disposition: A | Discharge status: Home / Self Care | Payer: 59 | Source: Home / Self Care

## 2013-10-01 ENCOUNTER — Encounter (HOSPITAL_COMMUNITY): Payer: Self-pay | Admitting: Emergency Medicine

## 2013-10-01 DIAGNOSIS — J309 Allergic rhinitis, unspecified: Secondary | ICD-10-CM

## 2013-10-01 DIAGNOSIS — B9789 Other viral agents as the cause of diseases classified elsewhere: Secondary | ICD-10-CM

## 2013-10-01 DIAGNOSIS — B349 Viral infection, unspecified: Secondary | ICD-10-CM

## 2013-10-01 NOTE — ED Provider Notes (Signed)
CSN: [id number]     Arrival date & time 10/01/13  4132 History   First MD Initiated Contact with Patient 10/01/13 [phone]     Chief Complaint  Patient presents with  . 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 It is associated with copious PND, low-grade fevers, chills and body aches.   Past Medical History  Diagnosis Date  . Diabetes mellitus     Type II   History reviewed. No pertinent past surgical history. Family History  Problem Relation Age of Onset  . Diabetes Other     Familly Hx First degree relative   History  Substance Use Topics  . Smoking status: Never Smoker   . Smokeless tobacco: Not on file  . Alcohol Use: No   OB History   Grav Para Term Preterm Abortions TAB SAB Ect Mult Living   1 1             Review of Systems  Constitutional: Positive for fever and chills. Negative for activity change, appetite change and fatigue.  HENT: Positive for congestion, postnasal drip, rhinorrhea and sore throat. Negative for facial swelling.   Eyes: Negative.   Respiratory: Positive for cough. Negative for choking, shortness of breath and wheezing.   Cardiovascular: Negative.   Gastrointestinal: Negative.   Genitourinary: Negative.   Musculoskeletal: Negative for neck pain and neck stiffness.  Skin: Negative for pallor and rash.  Neurological: Negative.     Allergies  Penicillins  Home Medications   Current Outpatient Rx  Name  Route  Sig  Dispense  Refill  . ACCU-CHEK SMARTVIEW test strip      USE TO TEST AS DIRECTED   100 each   5   . acyclovir (ZOVIRAX) 400 MG tablet      TAKE 2 TABLETS BY MOUTH TWICE DAILY   50 tablet   3   . cetirizine (ZYRTEC) 10 MG tablet   Oral   Take 10 mg by mouth daily as needed.          . fluticasone (FLONASE) 50 MCG/ACT nasal spray      INSTILL  1 SPRAY IN EACH NOSTRIL AT BEDTIME   16 g   6   . glipiZIDE (GLUCOTROL) 10 MG tablet   Oral   Take 1 tablet (10 mg total) by mouth 2 (two) times daily before a meal.   180 tablet   3   . lisinopril (PRINIVIL,ZESTRIL) 5 MG tablet      TAKE 1 TABLET BY MOUTH DAILY.   100 tablet   3   . metFORMIN (GLUCOPHAGE) 1000 MG tablet      TAKE 1 TABLET BY MOUTH 2 TIMES DAILY WITH A MEAL.   200 tablet   3   . Multiple Vitamins-Minerals (DIASENSE MULTIVITAMIN) TABS   Oral   Take 1 tablet by mouth daily.           Marland Kitchen NORTREL 1/35, 28, tablet      TAKE 1 TABLET BY MOUTH DAILY.   84 tablet   3   . simvastatin (ZOCOR) 20 MG tablet      TAKE 1 TABLET BY MOUTH AT BEDTIME.   100 tablet   3    BP 111/78  Pulse 109  Temp(Src) 99.2 F (37.3 C) (Oral)  Resp 16  SpO2 100% Physical Exam  Nursing note and vitals reviewed. Constitutional: She is oriented  to person, place, and time. She appears well-developed and well-nourished. No distress.  HENT:  Mouth/Throat: No oropharyngeal exudate.  Bilateral TMs retracted, no effusion or erythema. Oropharynx with mild erythema, cobblestoning and clear PND  Eyes: Conjunctivae and EOM are normal.  Neck: Normal range of motion. Neck supple.  Cardiovascular: Normal rate, regular rhythm and normal heart sounds.   Pulmonary/Chest: Effort normal and breath sounds normal. No respiratory distress. She has no wheezes. She has no rales.  Abdominal: Soft. There is no tenderness.  Musculoskeletal: Normal range of motion. She exhibits no edema.  Lymphadenopathy:    She has no cervical adenopathy.  Neurological: She is alert and oriented to person, place, and time.  Skin: Skin is warm and dry. No rash noted.  Psychiatric: She has a normal mood and affect.    ED Course  Procedures (including critical care time) Labs Review Labs Reviewed - No data to display Imaging Review No results found.   MDM   1. Allergic rhinitis   2. Viral syndrome      No source found to suggest a bacterial source. rhinits vs viral syndrome. Allegra 180; may add chlor-Trimeton at night if needed. Decongestant flonase Nasal saline   Janne Napoleon, NP 10/01/13 [phone]

## 2013-10-01 NOTE — ED Provider Notes (Signed)
Medical screening examination/treatment/procedure(s) were performed by a resident physician or non-physician practitioner and as the supervising physician I was immediately available for consultation/collaboration.  Evan Corey, MD    Evan S Corey, MD 10/01/13 2155 

## 2013-10-01 NOTE — Discharge Instructions (Signed)
Allergic Rhinitis Restart Flonase nasal spray Allegra 180 mg every day for drainage If need additional help at night Chlor-Trimeton 2-4 mg (get generic), may cause drowsiness. Sudafed PE 10 mg or pseudoephedrine that comes in [place] D or sold separately. Plenty of fluids. Lots of nasal saline frequently.  Allergic rhinitis is when the mucous membranes in the nose respond to allergens. Allergens are particles in the air that cause your body to have an allergic reaction. This causes you to release allergic antibodies. Through a chain of events, these eventually cause you to release histamine into the blood stream. Although meant to protect the body, it is this release of histamine that causes your discomfort, such as frequent sneezing, congestion, and an itchy, runny nose.  CAUSES  Seasonal allergic rhinitis (hay fever) is caused by pollen allergens that may come from grasses, trees, and weeds. Year-round allergic rhinitis (perennial allergic rhinitis) is caused by allergens such as house dust mites, pet dander, and mold spores.  SYMPTOMS   Nasal stuffiness (congestion).  Itchy, runny nose with sneezing and tearing of the eyes. DIAGNOSIS  Your health care provider can help you determine the allergen or allergens that trigger your symptoms. If you and your health care provider are unable to determine the allergen, skin or blood testing may be used. TREATMENT  Allergic Rhinitis does not have a cure, but it can be controlled by:  Medicines and allergy shots (immunotherapy).  Avoiding the allergen. Hay fever may often be treated with antihistamines in pill or nasal spray forms. Antihistamines block the effects of histamine. There are over-the-counter medicines that may help with nasal congestion and swelling around the eyes. Check with your health care provider before taking or giving this medicine.  If avoiding the allergen or the medicine prescribed do not work, there are many new medicines  your health care provider can prescribe. Stronger medicine may be used if initial measures are ineffective. Desensitizing injections can be used if medicine and avoidance does not work. Desensitization is when a patient is given ongoing shots until the body becomes less sensitive to the allergen. Make sure you follow up with your health care provider if problems continue. HOME CARE INSTRUCTIONS It is not possible to completely avoid allergens, but you can reduce your symptoms by taking steps to limit your exposure to them. It helps to know exactly what you are allergic to so that you can avoid your specific triggers. SEEK MEDICAL CARE IF:   You have a fever.  You develop a cough that does not stop easily (persistent).  You have shortness of breath.  You start wheezing.  Symptoms interfere with normal daily activities. Document Released: 03/21/2001 Document Revised: 04/16/2013 Document Reviewed: 03/03/2013 [facility] Patient Information 2014 [place].  Viral Infections A viral infection can be caused by different types of viruses.Most viral infections are not serious and resolve on their own. However, some infections may cause severe symptoms and may lead to further complications. SYMPTOMS Viruses can frequently cause:  Minor sore throat.  Aches and pains.  Headaches.  Runny nose.  Different types of rashes.  Watery eyes.  Tiredness.  Cough.  Loss of appetite.  Gastrointestinal infections, resulting in nausea, vomiting, and diarrhea. These symptoms do not respond to antibiotics because the infection is not caused by bacteria. However, you might catch a bacterial infection following the viral infection. This is sometimes called a "superinfection." Symptoms of such a bacterial infection may include:  Worsening sore throat with pus and difficulty swallowing.  Swollen neck glands.  Chills and a high or persistent fever.  Severe headache.  Tenderness over the  sinuses.  Persistent overall ill feeling (malaise), muscle aches, and tiredness (fatigue).  Persistent cough.  Yellow, green, or brown mucus production with coughing. HOME CARE INSTRUCTIONS   Only take over-the-counter or prescription medicines for pain, discomfort, diarrhea, or fever as directed by your caregiver.  Drink enough water and fluids to keep your urine clear or pale yellow. Sports drinks can provide valuable electrolytes, sugars, and hydration.  Get plenty of rest and maintain proper nutrition. Soups and broths with crackers or rice are fine. SEEK IMMEDIATE MEDICAL CARE IF:   You have severe headaches, shortness of breath, chest pain, neck pain, or an unusual rash.  You have uncontrolled vomiting, diarrhea, or you are unable to keep down fluids.  You or your child has an oral temperature above 102 F (38.9 C), not controlled by medicine.  Your baby is older than 3 months with a rectal temperature of 102 F (38.9 C) or higher.  Your baby is 8 months old or younger with a rectal temperature of 100.4 F (38 C) or higher. MAKE SURE YOU:   Understand these instructions.  Will watch your condition.  Will get help right away if you are not doing well or get worse. Document Released: 04/05/2005 Document Revised: 09/18/2011 Document Reviewed: 10/31/2010 [facility] Patient Information 2014 [place], [place].

## 2013-10-01 NOTE — ED Notes (Signed)
Fever, congestion x 2 weeks; minmal relief w OTC medications

## 2013-10-03 ENCOUNTER — Telehealth: Payer: Self-pay | Admitting: Family Medicine

## 2013-10-03 NOTE — Telephone Encounter (Signed)
Patient Information:  Caller Name: Lenita  Phone: [phone]  Patient: [name], [name]  Gender: Female  DOB: [birth year]  Age: 43 Years  PCP: Stevie K[facility])  Pregnant: No  Office Follow Up:  Does the office need to follow up with this patient?: No  Instructions For The Office: N/A   Symptoms  Reason For Call & Symptoms: Pt reports she has low grade fever, backache,  sneezing, cough, p[ost nasal drip.  Reviewed Health History In EMR: Yes  Reviewed Medications In EMR: Yes  Reviewed Allergies In EMR: Yes  Reviewed Surgeries / Procedures: Yes  Date of Onset of Symptoms: 09/30/2013 OB / GYN:  LMP: Unknown  Guideline(s) Used:  Hay Fever - Nasal Allergies  Disposition Per Guideline:   Home Care  Reason For Disposition Reached:   Nasal allergies occur only certain times of year  Advice Given:  Wash off Pollen Daily:  Remove pollen from the body with hair washing and a shower, especially before bedtime.  Wash off Pollen Daily:  Remove pollen from the body with hair washing and a shower, especially before bedtime.  Avoiding Pollen:  Keep windows closed in home, at least in bedroom; use air conditioner  Use a high efficiency house air filter (HEPA or electrostatic)  For a Stuffy Nose - Use Nasal Washes:  Introduction: Saline (salt water) nasal irrigation (nasal washes) is an effective and simple home remedy for treating stuffy nose and sinus congestion. The nose can be irrigated by pouring, spraying, or squirting salt water into the nose and then letting it run back out.  Antihistamine Medications for Hay Fever:  Antihistamines help reduce sneezing, itching, and runny nose.  You may need to take antihistamines continuously during pollen season (Reason: continuously is the key to control).  Nasal Decongestant Nose Drops for Stuffy Nose:  Antihistamines do not help nasal congestion (stuffiness), but decongestant nose drops do. Decongestants shrink the swollen nasal  mucosa and allow for easier breathing.  Phenylephrine nose drops (e.g., Neo-Synephrine) are available over-the-counter. Clean out the nose before using. Spray each nostril once, wait one minute for absorption, and then spray a second time.  Call Back If:  Symptoms are not controlled in 2 days with continuous antihistamines  You become worse  Patient Will Follow Care Advice:  YES

## 2013-10-03 NOTE — Telephone Encounter (Signed)
Noted  

## 2014-01-07 LAB — HM DIABETES EYE EXAM

## 2014-02-06 ENCOUNTER — Ambulatory Visit (INDEPENDENT_AMBULATORY_CARE_PROVIDER_SITE_OTHER): Payer: Self-pay | Admitting: Family Medicine

## 2014-02-06 VITALS — BP 118/68 | Ht 67.0 in | Wt 155.4 lb

## 2014-02-06 DIAGNOSIS — E119 Type 2 diabetes mellitus without complications: Secondary | ICD-10-CM

## 2014-02-06 NOTE — Progress Notes (Signed)
Subjective:  Patient presents today for 3 month diabetes follow-up as part of the employer-sponsored Link to Wellness program. Current diabetes regimen includes glipizide 10 mg BID and metformin 1000 mg BID. Patient also continues on daily ACEi, and statin. No med changes or major health changes at this time.  Patient states that she has gained weight since her last visit. She states that she has splurged a bit. She went on vacation recently and she forgot to take medication including metformin and glipizide. She noticed that her blood sugars have been elevated. She reports that she hasn't been able to exercise as much lately and she feels like she isn't in a schedule. She has been working more lately.  She has an appointment pending with Dr. Sherren Mocha in August. She goes on Tuesday next week for blood work.   Disease Assessments:  Diabetes: Type of Diabetes: Type 2; Year of diagnosis 2008; Sees Diabetes provider 2 times per year; MD managing Diabetes Dr. Stevie Kern; uses glucometer; takes medications as prescribed; does not take an aspirin a day; checks feet daily;   Highest CBG 122; Lowest CBG 73; hypoglycemia frequency rarely; checks blood glucose 1-2 times a week; 30 day CBG average 93; 14 day CBG average 104;   Other Diabetes History:  She denies hypoglycemia. She states that she isn't checking her blood sugar as much lately. She is usually checking about once a week in the morning, fasting. Highest/Lowest CBG in the last month was 122/73 (self reported). Averages are also self reported.   Tobacco Assessment: Smoking Status: Never smoker; Last Reviewed: 02/06/2014   Physical Activity-  She states that it is hit or miss. She [place] and swimming. She isn't on a schedule for exercise. She states that on average she is getting one day a week or running.  Nutrition- She states she is still trying to follow weight watchers although she is not going to the meetings anymore.  She  is drinking smoothies for the days that she is working. It has spinach, bananas and berries. She admits that she has a problem with sweets. She is eating a sweet every days she thinks. Some days it is more than others.    Preventive Care:    Hemoglobin A1c: 09/08/2013 via POCT 6.8    Dilated Eye Exam: 10/30/2012  Flu vaccine: 04/09/2013  Foot Exam: 08/02/2012  Other Preventive Care Notes:  Dental Exam- Fall 2014      Vital Signs:  02/06/2014 3:01 PM (EST)Blood Pressure 118 / 68 mm/HgBMI 24.3; Height 5 ft 7 in; Weight 155.4 lbs        Assessment/Plan: Patient is a 43 year old female with DM2. Most recent A1C was 6.8% and was meeting goal of less than 7%. She has an upcoming appointment pending with her PCP. I will defer A1C testing to that visit. However, given that patient's weight is stable and her reported blood sugars are also similar to last visit, I expect that A1C will be close to 7%.  Patient continues to make healthy eating choices. However, she has not been exercising consistently. Patient wants to improve this and we worked out a plan for her to resume regular physical activity. We also logged in the wellness website so that she could claim her healthy weight badge. She has a log of her activity and I will check with the wellness coordinator to see if she can still claim the physical activity badge.  Follow up with patient in  3 months. .       Goals for Next Visit- 1. Email Terri Skains ([email or web address]) to see if you can count your Cardio Trainer app as your exercise history. 2. Log into the the [email or web address] website to claim your badges. 3. Aim to exercise three days a week. Ideas- running/jogging, incorporate strength training, swimming. Aim for 30 minutes at least each time you exercise. 4. On the days that you are home to cook dinner, try to make healthier dinner meals. Ideas- use one of your weight watchers recipes. Next appointment to see me  is Monday November 2nd at 8:30 AM.

## 2014-02-09 ENCOUNTER — Other Ambulatory Visit (INDEPENDENT_AMBULATORY_CARE_PROVIDER_SITE_OTHER): Payer: 59

## 2014-02-09 DIAGNOSIS — Z0000 Encounter for general adult medical examination without abnormal findings: Secondary | ICD-10-CM

## 2014-02-09 LAB — MICROALBUMIN / CREATININE URINE RATIO
CREATININE, U: 181.7 mg/dL
MICROALB UR: 0.2 mg/dL (ref 0.0–1.9)
MICROALB/CREAT RATIO: 0.1 mg/g (ref 0.0–30.0)

## 2014-02-09 LAB — BASIC METABOLIC PANEL
BUN: 10 mg/dL (ref 6–23)
CHLORIDE: 100 meq/L (ref 96–112)
CO2: 28 meq/L (ref 19–32)
Calcium: 9.3 mg/dL (ref 8.4–10.5)
Creatinine, Ser: 0.9 mg/dL (ref 0.4–1.2)
GFR: 76.67 mL/min (ref >60.00)
Glucose, Bld: 101 mg/dL — ABNORMAL HIGH (ref 70–99)
Potassium: 4.6 mEq/L (ref 3.5–5.1)
SODIUM: 135 meq/L (ref 135–145)

## 2014-02-09 LAB — CBC WITH DIFFERENTIAL/PLATELET
Basophils Absolute: 0 10*3/uL (ref 0.0–0.1)
Basophils Relative: 0.3 % (ref 0.0–3.0)
EOS ABS: 0.3 10*3/uL (ref 0.0–0.7)
Eosinophils Relative: 4 % (ref 0.0–5.0)
HEMATOCRIT: 39.5 % (ref 36.0–46.0)
HEMOGLOBIN: 13.4 g/dL (ref 12.0–15.0)
Lymphocytes Relative: 21.9 % (ref 12.0–46.0)
Lymphs Abs: 1.8 10*3/uL (ref 0.7–4.0)
MCHC: 33.8 g/dL (ref 30.0–36.0)
MCV: 90.7 fl (ref 78.0–100.0)
Monocytes Absolute: 0.4 10*3/uL (ref 0.1–1.0)
Monocytes Relative: 5.3 % (ref 3.0–12.0)
NEUTROS ABS: 5.7 10*3/uL (ref 1.4–7.7)
Neutrophils Relative %: 68.5 % (ref 43.0–77.0)
Platelets: 286 10*3/uL (ref 150.0–400.0)
RBC: 4.35 Mil/uL (ref 3.87–5.11)
RDW: 13.4 % (ref 11.5–15.5)
WBC: 8.3 10*3/uL (ref 4.0–10.5)

## 2014-02-09 LAB — LIPID PANEL
CHOL/HDL RATIO: 2
Cholesterol: 164 mg/dL (ref 0–200)
HDL: 76.4 mg/dL (ref >39.00)
NonHDL: 87.6
Triglycerides: 202 mg/dL — ABNORMAL HIGH (ref 0.0–149.0)
VLDL: 40.4 mg/dL — ABNORMAL HIGH (ref 0.0–40.0)

## 2014-02-09 LAB — POCT URINALYSIS DIPSTICK
Bilirubin, UA: NEGATIVE
Glucose, UA: NEGATIVE
Ketones, UA: NEGATIVE
LEUKOCYTES UA: NEGATIVE
Nitrite, UA: NEGATIVE
PROTEIN UA: NEGATIVE
SPEC GRAV UA: 1.015
UROBILINOGEN UA: 0.2
pH, UA: 7

## 2014-02-09 LAB — HEPATIC FUNCTION PANEL
ALBUMIN: 4 g/dL (ref 3.5–5.2)
ALT: 17 U/L (ref 0–35)
AST: 20 U/L (ref 0–37)
Alkaline Phosphatase: 45 U/L (ref 39–117)
Bilirubin, Direct: 0 mg/dL (ref 0.0–0.3)
TOTAL PROTEIN: 7.3 g/dL (ref 6.0–8.3)
Total Bilirubin: 0.4 mg/dL (ref 0.2–1.2)

## 2014-02-09 LAB — LDL CHOLESTEROL, DIRECT: Direct LDL: 80.2 mg/dL

## 2014-02-09 LAB — HEMOGLOBIN A1C: Hgb A1c MFr Bld: 7.3 % — ABNORMAL HIGH (ref 4.6–6.5)

## 2014-02-10 LAB — TSH: TSH: 1.57 u[IU]/mL (ref 0.35–4.50)

## 2014-02-16 ENCOUNTER — Encounter: Payer: 59 | Admitting: Family Medicine

## 2014-02-17 ENCOUNTER — Other Ambulatory Visit: Payer: Self-pay | Admitting: Family Medicine

## 2014-02-26 ENCOUNTER — Encounter: Payer: Self-pay | Admitting: Family Medicine

## 2014-02-26 ENCOUNTER — Ambulatory Visit (INDEPENDENT_AMBULATORY_CARE_PROVIDER_SITE_OTHER): Payer: 59 | Admitting: Family Medicine

## 2014-02-26 ENCOUNTER — Other Ambulatory Visit (HOSPITAL_COMMUNITY)
Admission: RE | Admit: 2014-02-26 | Discharge: 2014-02-26 | Disposition: A | Discharge status: Home / Self Care | Payer: 59 | Source: Ambulatory Visit | Attending: Family Medicine | Admitting: Family Medicine

## 2014-02-26 VITALS — BP 110/80 | Temp 98.2°F | Ht 65.5 in | Wt 161.0 lb

## 2014-02-26 DIAGNOSIS — E785 Hyperlipidemia, unspecified: Secondary | ICD-10-CM

## 2014-02-26 DIAGNOSIS — N949 Unspecified condition associated with female genital organs and menstrual cycle: Secondary | ICD-10-CM

## 2014-02-26 DIAGNOSIS — Z01419 Encounter for gynecological examination (general) (routine) without abnormal findings: Secondary | ICD-10-CM | POA: Insufficient documentation

## 2014-02-26 DIAGNOSIS — Z1151 Encounter for screening for human papillomavirus (HPV): Secondary | ICD-10-CM | POA: Diagnosis present

## 2014-02-26 DIAGNOSIS — B009 Herpesviral infection, unspecified: Secondary | ICD-10-CM

## 2014-02-26 DIAGNOSIS — R87810 Cervical high risk human papillomavirus (HPV) DNA test positive: Secondary | ICD-10-CM | POA: Insufficient documentation

## 2014-02-26 DIAGNOSIS — T7840XA Allergy, unspecified, initial encounter: Secondary | ICD-10-CM

## 2014-02-26 DIAGNOSIS — N938 Other specified abnormal uterine and vaginal bleeding: Secondary | ICD-10-CM

## 2014-02-26 DIAGNOSIS — Z23 Encounter for immunization: Secondary | ICD-10-CM

## 2014-02-26 DIAGNOSIS — E109 Type 1 diabetes mellitus without complications: Secondary | ICD-10-CM

## 2014-02-26 DIAGNOSIS — E119 Type 2 diabetes mellitus without complications: Secondary | ICD-10-CM

## 2014-02-26 MED ORDER — METFORMIN HCL 1000 MG PO TABS: ORAL_TABLET | ORAL | Status: DC

## 2014-02-26 MED ORDER — NORETHINDRONE-ETH ESTRADIOL 1-35 MG-MCG PO TABS: ORAL_TABLET | ORAL | Status: DC

## 2014-02-26 MED ORDER — LISINOPRIL 5 MG PO TABS: ORAL_TABLET | ORAL | Status: DC

## 2014-02-26 MED ORDER — SIMVASTATIN 20 MG PO TABS: ORAL_TABLET | ORAL | Status: DC

## 2014-02-26 MED ORDER — GLIPIZIDE 10 MG PO TABS: 10.0000 mg | ORAL_TABLET | Freq: Two times a day (BID) | ORAL | Status: DC

## 2014-02-26 MED ORDER — FLUTICASONE PROPIONATE 50 MCG/ACT NA SUSP: NASAL | Status: DC

## 2014-02-26 MED ORDER — ACYCLOVIR 400 MG PO TABS: ORAL_TABLET | ORAL | Status: DC

## 2014-02-26 NOTE — Progress Notes (Signed)
Pre visit review using our clinic review tool, if applicable. No additional management support is needed unless otherwise documented below in the visit note. 

## 2014-02-26 NOTE — Patient Instructions (Signed)
Continue current medications  Walk 30 minutes daily  Followup in 3 months  A1c one week prior

## 2014-02-26 NOTE — Progress Notes (Signed)
   Subjective:    Patient ID: [name] Young, female    DOB: 11-02-1970, 43 y.o.   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  She takes simvastatin 20 mg daily LDL cholesterol is 80  She takes acyclovir 400 twice a day when necessary for HSV 1 oral  She uses steroid nasal spray for allergic rhinitis  She takes lisinopril 5 mg daily for renal protection because of diabetes. BP 110/80  She's on BCPs because of DOB  She says she feels well and has no complaints. She had one episode of bright red rectal bleeding associated with a hard bowel movement nothing since. She thinks her father may have had colon polyps she's going to check and see. If indeed he did I would recommend a colonoscopy at this juncture. No recurrent bleeding  Vaccinations updated by Apolonio Schneiders  Review of Systems  Constitutional: Negative.   HENT: Negative.   Eyes: Negative.   Respiratory: Negative.   Cardiovascular: Negative.   Gastrointestinal: Negative.   Genitourinary: Negative.   Musculoskeletal: Negative.   Neurological: Negative.   Psychiatric/Behavioral: Negative.        Objective:   Physical Exam  Nursing note and vitals reviewed. Constitutional: She appears well-developed and well-nourished.  HENT:  Head: Normocephalic and atraumatic.  Right Ear: External ear normal.  Left Ear: External ear normal.  Nose: Nose normal.  Mouth/Throat: Oropharynx is clear and moist.  Eyes: EOM are normal. Pupils are equal, round, and reactive to light.  Neck: Normal range of motion. Neck supple. No thyromegaly present.  Cardiovascular: Normal rate, regular rhythm, normal heart sounds and intact distal pulses.  Exam reveals no gallop and no friction rub.   No murmur heard. Pulmonary/Chest: Effort normal and breath sounds  normal.  Abdominal: Soft. Bowel sounds are normal. She exhibits no distension and no mass. There is no tenderness. There is no rebound.  Genitourinary: Vagina normal and uterus normal. Guaiac negative stool. No vaginal discharge found.  Bilateral breast exam normal  Musculoskeletal: Normal range of motion.  Lymphadenopathy:    She has no cervical adenopathy.  Neurological: She is alert. She has normal reflexes. No cranial nerve deficit. She exhibits normal muscle tone. Coordination normal.  Skin: Skin is warm and dry.  Psychiatric: She has a normal mood and affect. Her behavior is normal. Judgment and thought content normal.          Assessment & Plan:  Healthy female  Diabetes type 2 at goal  Hypertension at goal  Hyperlipidemia at goal  HSV 1 acyclovir when necessary  Allergic rhinitis steroid nasal spray when necessary  Followup in 3  months

## 2014-02-27 LAB — CYTOLOGY - PAP

## 2014-03-12 ENCOUNTER — Encounter: Payer: Self-pay | Admitting: Family Medicine

## 2014-03-12 NOTE — Progress Notes (Signed)
Patient ID: [name] Young, female   DOB: 04/22/1971, 43 y.o.   MRN: [id number] Reviewed: Agree with the documentation and management of our [place] Pharmacologist. 

## 2014-03-12 NOTE — Progress Notes (Signed)
Patient ID: [name] Young, female   DOB: 03/31/1971, 43 y.o.   MRN: [id number] Reviewed: Agree with the documentation and management of our [place] Pharmacologist. 

## 2014-03-12 NOTE — Progress Notes (Signed)
Patient ID: [name] Young, female   DOB: 12/07/1970, 43 y.o.   MRN: [id number] Reviewed: Agree with the documentation and management of our [place] Pharmacologist. 

## 2014-04-16 ENCOUNTER — Other Ambulatory Visit: Payer: Self-pay | Admitting: Family Medicine

## 2014-05-11 ENCOUNTER — Encounter: Payer: Self-pay | Admitting: Family Medicine

## 2014-05-14 ENCOUNTER — Telehealth: Payer: Self-pay | Admitting: Family Medicine

## 2014-05-14 NOTE — Telephone Encounter (Signed)
Yes.  Dr Sherren Mocha would like for her to have her labs done before she comes to see him.  Please schedule

## 2014-05-14 NOTE — Telephone Encounter (Signed)
Labs scheduled  

## 2014-05-14 NOTE — Telephone Encounter (Signed)
Pt called to see if she need to schedule a1c lab prior to upcoming appt. Pt states she was having issues and dr todd wanted her to fu.  Labs are in the system I scheduled . Is that ok?

## 2014-05-15 ENCOUNTER — Other Ambulatory Visit (INDEPENDENT_AMBULATORY_CARE_PROVIDER_SITE_OTHER): Payer: 59

## 2014-05-15 DIAGNOSIS — B009 Herpesviral infection, unspecified: Secondary | ICD-10-CM

## 2014-05-15 DIAGNOSIS — E119 Type 2 diabetes mellitus without complications: Secondary | ICD-10-CM

## 2014-05-15 LAB — BASIC METABOLIC PANEL
BUN: 11 mg/dL (ref 6–23)
CO2: 29 meq/L (ref 19–32)
Calcium: 9 mg/dL (ref 8.4–10.5)
Chloride: 103 mEq/L (ref 96–112)
Creatinine, Ser: 0.8 mg/dL (ref 0.4–1.2)
GFR: 84.57 mL/min (ref >60.00)
Glucose, Bld: 96 mg/dL (ref 70–99)
Potassium: 4 mEq/L (ref 3.5–5.1)
SODIUM: 137 meq/L (ref 135–145)

## 2014-05-15 LAB — HEMOGLOBIN A1C: Hgb A1c MFr Bld: 7.5 % — ABNORMAL HIGH (ref 4.6–6.5)

## 2014-05-26 ENCOUNTER — Encounter: Payer: Self-pay | Admitting: Family Medicine

## 2014-05-26 ENCOUNTER — Ambulatory Visit (INDEPENDENT_AMBULATORY_CARE_PROVIDER_SITE_OTHER): Payer: 59 | Admitting: Family Medicine

## 2014-05-26 VITALS — BP 120/80 | Temp 98.0°F | Wt 160.0 lb

## 2014-05-26 DIAGNOSIS — E139 Other specified diabetes mellitus without complications: Secondary | ICD-10-CM

## 2014-05-26 NOTE — Progress Notes (Signed)
   Subjective:    Patient ID: [name] Young, female    DOB: [birth year], 43 y.o.   MRN: [id number]  HPI [name] Young is a 43 year old married female nonsmoker who comes in today for follow-up of diabetes  She is on glipizide 10 mg twice a day and metformin 1000 mg twice a day. Recent A1c was 7.5%. A1c prior that was 7.2%. Her medications have remained stable at but she's not working out like she normally does.  In the past she was actually on insulin but she is no longer on insulin because she got on a strict diet and lost a lot of weight.   Review of Systems    review of systems otherwise negative Objective:   Physical Exam  Well-developed well-nourished female no acute distress vital signs stable she's afebrile BP normal 120/80      Assessment & Plan:  . Diabetes type 2...................Marland Kitchen A1c up from 7.2...Marland KitchenMarland KitchenMarland Kitchen To 7.5.......... Stressed diet and exercise continue current meds follow-up in 3 months

## 2014-05-26 NOTE — Progress Notes (Signed)
Pre visit review using our clinic review tool, if applicable. No additional management support is needed unless otherwise documented below in the visit note. Lab Results  Component Value Date   HGBA1C 7.5* 05/15/2014   HGBA1C 7.3* 02/09/2014   HGBA1C 7.1* 01/28/2013   Lab Results  Component Value Date   MICROALBUR 0.2 02/09/2014   LDLCALC 65 01/28/2013   CREATININE 0.8 05/15/2014

## 2014-05-26 NOTE — Patient Instructions (Signed)
Continue your current medication  Work hard on your diet and exercise  Follow-up in 3 months  Nonfasting labs one week prior

## 2014-06-01 ENCOUNTER — Ambulatory Visit (INDEPENDENT_AMBULATORY_CARE_PROVIDER_SITE_OTHER): Payer: Self-pay | Admitting: Family Medicine

## 2014-06-01 VITALS — BP 102/70 | Wt 155.6 lb

## 2014-06-01 DIAGNOSIS — E119 Type 2 diabetes mellitus without complications: Secondary | ICD-10-CM

## 2014-06-01 NOTE — Assessment & Plan Note (Signed)
Subjective:  Patient presents today for 3 month diabetes follow-up as part of the employer-sponsored Link to Wellness program. Current diabetes regimen includes Metformin 1000 mg BID and glipizide 10 mg BID. Patient also continues on ACEi, and statin. No med changes or major health changes at this time.  Patient states that things have been going OK. Her last A1C was 7.5% 2 weeks ago with Dr. Sherren Mocha. Weight is stable since her last appointment with me. She states that she isn't consistently exercising. No medication changes since her last visit.     Diabetes: Type of Diabetes: Type 2; Year of diagnosis 2008; Sees Diabetes provider 2 times per year; MD managing Diabetes Dr. Stevie Kern; uses glucometer; takes medications as prescribed; does not take an aspirin a day; checks feet daily; checks blood glucose 1-2 times a week; hypoglycemia frequency occasionally; Highest CBG 160; Lowest CBG 37;   Other Diabetes History:  She did not bring her meter with her to the appointment. She states that she is checking about 4 days out of the week fasting. She states on average she is running 120-150. She reports 2 episodes of hypoglycemia (one down to 55, one down to 37). Both times she brought the blood sugar up with a piece of fruit. I asked her about keeping glucose tablets around but she states that she does not like eating the glucose tablets.  Highs/lows patient reported. Most recent A1C was 7.5%. She reports that blood sugars are higher on the days after she works. She thinks this is because she is eating dinner and then going straight to bed.   Tobacco Assessment: Smoking Status: Never smoker; Last Reviewed: 06/01/2014   Social History:  Caffeine use: coffee colaDiet Coke & Diet Dr. Malachi Bonds coffee- at least once daily; Medication adherence adherent; Patient knows the purpose/use of medications; Diet adherence Greater than 75% of the time; Patient can afford medications; Exercise adherence 1-2 days a week;  60 minutes of exercise per week; Alcohol use: 1 - 2 drinks per week.  Occupation: Therapist, sports- [employer]  Physical Activity-  She states that she hasn't been doing as much. She is walking/jogging sometimes during the week- maybe once a week. She is no longer doing zumba.  Nutrition-  She is no longer doing weight watchers. She is watching what she eats but she isn't counting points. She is counting carbohydrates and she is aiming for 45 g carbohydrates with each meal.    Preventive Care:      Dilated Eye Exam: 12/08/2013  Flu vaccine: 04/09/2014  Foot Exam: 08/02/2012  Pneumovax: 03/06/2014  Other Preventive Care Notes:  Dental Exam- July 2015      Overall Health Assessments:  Vision:  Dilated Eye Exam: 12/08/2013   Vital Signs:  06/01/2014 11:53 AM (EST)Blood Pressure 102 / 70 mm/HgBMI 24.4; Height 5 ft 7 in; Weight 155.6 lbs   Testing:  Blood Sugar Tests: Hemoglobin A1c: 7.5 via EPIC resulted on 05/15/2014   Care Planning:  Learning Preference Assessment:  Learner: Patient  Readiness to Learn Barriers: None  Teaching Method: Explanation  Evaluation of Learning: Can function independently and verbalize knowledge  Readiness to Change:  How important is your health to you? 8  How confident are you in working to improve your health? 8  How ready are you to change to improve your health? 8  Total Score: 8  Care Plan:  06/01/2014 11:53 AM (EST) (1)  Problem: Physical Inactivity  Role: Clinical Pharmacist  Long Term Goal Aim for 3  days of physical activity each week consistently.  Date Started: 06/01/2014     Assessment/Plan: Patient is a 43 year old female with DM2. Most recent A1C was 7.5% and is above goal of less than 7%. Patient states that she has not been consistently exercising and she believes this is partly responsible for the deterioration in glycemic control. Weight is stable since last visit with me. Patient continues to make healthy eating choices, but does admit to  splurging every now and then.  Reviewed with patient how to claim the wellness rewards under Year 2 of the new program. Patient expressed an interest in buying an activity tracker to help her track her physical activity and to help her stay motivated to exercise. She states that she could start using the treadmill again in order to more consistently exercise, but right now it is in the garage. Patient made the goal to bring the treadmill into the house.  Follow up with patient in 3 months.  Goals for Next Visit- 1. Continue to make healthy eating choices. 2. Start physical activity and aim for 3 days consistently during the week. 3. Bring the treadmill into your room so that you can walk while you watch a TV show. 4. Shop around for a fitness tracker. Some of the popular ones are Jawbones, Garmin vivo smart and fitbit. Next appointment to see me is Monday February 29th at 10:30 AM.

## 2014-06-29 NOTE — Progress Notes (Signed)
Patient ID: [name] Young, female   DOB: [birth year], 43 y.o.   MRN: [id number] Reviewed: Agree with the documentation and management of our [name].

## 2014-08-26 ENCOUNTER — Other Ambulatory Visit (INDEPENDENT_AMBULATORY_CARE_PROVIDER_SITE_OTHER): Payer: 59

## 2014-08-26 DIAGNOSIS — E139 Other specified diabetes mellitus without complications: Secondary | ICD-10-CM

## 2014-08-26 LAB — BASIC METABOLIC PANEL
BUN: 12 mg/dL (ref 6–23)
CO2: 27 mEq/L (ref 19–32)
Calcium: 9.8 mg/dL (ref 8.4–10.5)
Chloride: 98 mEq/L (ref 96–112)
Creatinine, Ser: 0.86 mg/dL (ref 0.40–1.20)
GFR: 75.47 mL/min (ref >60.00)
Glucose, Bld: 219 mg/dL — ABNORMAL HIGH (ref 70–99)
Potassium: 3.9 mEq/L (ref 3.5–5.1)
Sodium: 136 mEq/L (ref 135–145)

## 2014-08-26 LAB — HEMOGLOBIN A1C: Hgb A1c MFr Bld: 7.6 % — ABNORMAL HIGH (ref 4.6–6.5)

## 2014-09-02 ENCOUNTER — Ambulatory Visit (INDEPENDENT_AMBULATORY_CARE_PROVIDER_SITE_OTHER): Payer: 59 | Admitting: Family Medicine

## 2014-09-02 ENCOUNTER — Encounter: Payer: Self-pay | Admitting: Family Medicine

## 2014-09-02 VITALS — BP 110/80 | Temp 98.0°F | Wt 158.0 lb

## 2014-09-02 DIAGNOSIS — E139 Other specified diabetes mellitus without complications: Secondary | ICD-10-CM

## 2014-09-02 NOTE — Progress Notes (Signed)
   Subjective:    Patient ID: [name] Young, female    DOB: 04-Oct-1970, 44 y.o.   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 Initially we had on insulin but she got on a strict diet exercise program loss weight and no longer needed insulin. Over the past 2 years her A1c is gradually been going up. 2 years ago was 6.9 and now it is 7.6%. 3 months ago was 7.5%. She's also knows some tingling in her feet.  Nonfasting blood sugar 219   Review of Systems Review of systems otherwise negative    Objective:   Physical Exam  Well-developed well-nourished female no acute distress vital signs stable she's afebrile      Assessment & Plan:  Diabetes type 2 with increasing A1c despite oral meds diet and exercise...Marland KitchenMarland Kitchen. discuss with patient various options including going back on insulin which she tolerated well before.......Marland Kitchen or trying one of the new oral agents......... advised her to get a consult with Dr. Lorie Apley to discuss the pluses and minuses of all the new therapies versus insulin

## 2014-09-02 NOTE — Patient Instructions (Signed)
Continue current medications  Continue diet and exercise program  We'll get you set up a consult with Lorie Apley ASAP

## 2014-09-02 NOTE — Progress Notes (Signed)
Pre visit review using our clinic review tool, if applicable. No additional management support is needed unless otherwise documented below in the visit note. 

## 2014-09-03 ENCOUNTER — Other Ambulatory Visit: Payer: Self-pay

## 2014-09-03 DIAGNOSIS — Z1231 Encounter for screening mammogram for malignant neoplasm of breast: Secondary | ICD-10-CM

## 2014-09-07 ENCOUNTER — Ambulatory Visit (INDEPENDENT_AMBULATORY_CARE_PROVIDER_SITE_OTHER): Payer: Self-pay | Admitting: Family Medicine

## 2014-09-07 ENCOUNTER — Other Ambulatory Visit: Payer: Self-pay | Admitting: Family Medicine

## 2014-09-07 VITALS — BP 110/60 | Wt 151.0 lb

## 2014-09-07 DIAGNOSIS — E139 Other specified diabetes mellitus without complications: Secondary | ICD-10-CM

## 2014-09-07 NOTE — Assessment & Plan Note (Signed)
Subjective:  Patient presents today for 3 month diabetes follow-up as part of the employer-sponsored Link to Wellness program. Current diabetes regimen includes metformin 1000 mg BID and glipizide 10 mg BID. Patient also continues on daily ACEi, and statin. Most recent MD follow-up was with Dr. Todd a few weeks ago. A1C was 7.6%. She has been referred to an endocrinologist, Dr. Gherghe. She has an appointment pending with her this week. A few new medications- she has started green tea capsules and a digestive supplement containing a probiotic and digestive enzymes.  She reports that things did not go so well over Christmas. She was not paying attention to what she was eating and she thinks that is why her A1C was elevated.   Disease Assessments:  Diabetes: Type of Diabetes: Type 2; Year of diagnosis 2008; Sees Diabetes provider 2 times per year; MD managing Diabetes Dr. Jeffrey Todd; uses glucometer; takes medications as prescribed; does not take an aspirin a day; checks feet daily; hypoglycemia frequency occasionally; checks blood glucose 2-6 times a week;   7 day CBG average 123; 14 day CBG average 125; 30 day CBG average 127; Highest CBG 232 patient reports she lasagna the night before; Lowest CBG 49 she didn't eat enough carbohydrates the night before;   Other Diabetes History:  Since her last visit with Dr. Todd she has been checking blood sugar more frequently. She has 18 checks in the last month, 7 in the last week. She is checking usually first thing in the morning. She reports occasional hypoglycemia, maybe once a month. She has been referred to an endocrinologist and sees her on Friday. She had questions regarding Invokana, insulin and Victoza.   Tobacco Assessment: Smoking Status: Never smoker; Last Reviewed: 09/07/2014   Social History:  Caffeine use: coffee colaDiet Coke & Diet Dr. Pepper coffee- at least once daily; Medication adherence adherent; Patient knows the purpose/use of  medications; Diet adherence Greater than 75% of the time; Patient can afford medications; Exercise adherence 1-2 days a week; 60 minutes of exercise per week; Alcohol use: 1 - 2 drinks per week.  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 She is using the treadmill and also going outside to walk. She is spending 20-30 minutes walking.  Nutrition-  She states she is doing much better. She is doing green smoothies again (spinach, fruit, protein powder, stevia, bee pollen, flax seed). Sometimes she will use a meal supplement. She is counting carbohydrates for the other meals. She aims for 45, and tries to not go over 60 grams.    Preventive Care:    Hemoglobin A1c: 08/26/2014 via Epic 7.6    Dilated Eye Exam: 12/08/2013  Flu vaccine: 04/09/2014  Foot Exam: 08/02/2012  Pneumovax: 03/06/2014  Other Preventive Care Notes:  Dental Exam- January 2016      Vital Signs:  09/07/2014 11:29 AM (EST)Blood Pressure 110 / 60 mm/HgBMI 23.7; Height 5 ft 7 in; Weight 151.2 lbs   Testing:  Blood Sugar Tests: Hemoglobin A1c: 7.6 via epic resulted on 08/26/2014   Care Planning:  Learning Preference Assessment:  Learner: Patient  Readiness to Learn Barriers: None  Teaching Method: Explanation  Evaluation of Learning: Can function independently and verbalize knowledge  Readiness to Change:  How important is your health to you? 8  How confident are you in working to improve your health? 8  How ready are you to change to improve your   health? 8  Total Score: 8  Care Plan:  09/07/2014 11:29 AM (EST) (1)  Problem: Physical Inactivity  Role: Clinical Pharmacist  Long Term Goal Aim for 3 days of physical activity each week consistently.  Date Started: 06/01/2014  No Goal Met She states that she has improved but she is not all the way there yet.   Assessment/Plan:   Patient is a 44 year old female with DM2. Most recent A1C  with Dr. Todd was 7.6% earlier this month which is above goal of less than 7%. Patient has a pending referral to see an endocrinologist this week. Patient came with questions regarding Invokana, long acting insulin and GLP-1 agonists. I discussed these medications with patient and provided her with mechanism of action, relative glucose lowering capability and common side effects.  Patient has been exercising but she has not completed her goal of at least three days a week. I challenged patient to complete 150 minutes each week. She has moved the treadmill into the house and she is able to watch TV while she is exercising. She has been making healthier eating choices since Christmas and is counting carbohydrates with each meal.  Patient has an Accu-Chek meter which is no longer covered for zero copay with the Link to Wellness Program. I provided patient with a new True Result meter.  Follow up with patient in 3 months.  Goals for Next Visit- 1. Continue physical activity. Aim for 3 days a week for at least 30 minutes. 2. Continue making healthy eating choices. This will help you to achieve your goal weight of 150 pounds. 3. When you see Dr. Gherghe, ask her to send a new prescription for true result test strips. Next appointment to see me is Monday June 6th at 8 AM.    

## 2014-09-11 ENCOUNTER — Encounter: Payer: Self-pay | Admitting: Internal Medicine

## 2014-09-11 ENCOUNTER — Ambulatory Visit (INDEPENDENT_AMBULATORY_CARE_PROVIDER_SITE_OTHER): Payer: 59 | Admitting: Internal Medicine

## 2014-09-11 VITALS — BP 124/68 | HR 93 | Temp 98.4°F | Resp 12 | Wt 157.8 lb

## 2014-09-11 DIAGNOSIS — IMO0001 Reserved for inherently not codable concepts without codable children: Secondary | ICD-10-CM

## 2014-09-11 DIAGNOSIS — E119 Type 2 diabetes mellitus without complications: Secondary | ICD-10-CM

## 2014-09-11 DIAGNOSIS — E1165 Type 2 diabetes mellitus with hyperglycemia: Secondary | ICD-10-CM

## 2014-09-11 MED ORDER — GLIPIZIDE 10 MG PO TABS: 5.0000 mg | ORAL_TABLET | Freq: Two times a day (BID) | ORAL | Status: DC

## 2014-09-11 MED ORDER — CANAGLIFLOZIN 100 MG PO TABS: 100.0000 mg | ORAL_TABLET | Freq: Every day | ORAL | Status: DC

## 2014-09-11 NOTE — Progress Notes (Signed)
Patient ID: [name] Young, female   DOB: 18-Apr-1971, 44 y.o.   MRN: [id number]  HPI: [name] Young is a 44 y.o.-year-old female, referred by her PCP, Dr. Sherren Mocha, for management of DM2, dx in 2008 (GDM 1998), non-insulin-dependent, uncontrolled, without complications.  Last hemoglobin A1c was: Lab Results  Component Value Date   HGBA1C 7.6* 08/26/2014   HGBA1C 7.5* 05/15/2014   HGBA1C 7.3* 02/09/2014   Since she was dx with DM >> she lost 50 lbs and was able to come off Lantus.  Pt is on a regimen of: - Metformin 1000 mg 2x a day - Glipizide 10 mg 2x a day  Pt checks her sugars 1x a day and they are: - am: 109, 120s, 160 - 2h after b'fast: n/c - before lunch: n/c - 2h after lunch: n/c - before dinner: n/c - 2h after dinner: n/c - bedtime:  n/c - nighttime: n/c No lows. Lowest sugar was 50 (at night) - 1x every 3 mo; she has hypoglycemia awareness at 60.  Highest sugar was 208.  Glucometer: True result (has a new meter), but also AccuChek  Pt's meals are: - Breakfast: eggs, [place] bacon; green smoothies - Lunch: soup, chicken salad - Dinner: zucchini + chicken + rice - Snacks: Smoothies  - no CKD, last BUN/creatinine:  Lab Results  Component Value Date   BUN 12 08/26/2014   CREATININE 0.86 08/26/2014  On Lisinopril. - last set of lipids: Lab Results  Component Value Date   CHOL 164 02/09/2014   HDL 76.40 02/09/2014   LDLCALC 65 01/28/2013   LDLDIRECT 80.2 02/09/2014   TRIG 202.0* 02/09/2014   CHOLHDL 2 02/09/2014  On Zocor. - last eye exam was in 01/2014. No DR.  - + numbness and tingling in her foor  Pt has FH of DM in M[place] and father.  ROS: Constitutional: + both weight gain/loss, no fatigue, no subjective hyperthermia/hypothermia Eyes: no blurry vision, no xerophthalmia ENT: no sore throat, no nodules palpated in throat, no dysphagia/odynophagia, no hoarseness Cardiovascular: no CP/SOB/palpitations/leg swelling Respiratory: no  cough/SOB Gastrointestinal: no N/V/D/+ C Musculoskeletal: no muscle/joint aches Skin: no rashes, + hair loss Neurological: no tremors/numbness/tingling/dizziness Psychiatric: no depression/anxiety  Past Medical History  Diagnosis Date  . Diabetes mellitus     Type II   No past surgical history on file. History   Social History  . Marital Status: Married    Spouse Name: N/A  . 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 Smoking status: Never Smoker   . Smokeless tobacco: Not on file  . Alcohol Use: Socially; liquor  . Drug Use: No   Social History Narrative   Regular exercise - 2-3x a week   Current Outpatient Prescriptions on File Prior to Visit  Medication Sig Dispense Refill  . ACCU-CHEK FASTCLIX LANCETS MISC USE AS DIRECTED 102 each 5  . ACCU-CHEK SMARTVIEW test strip USE TO TEST AS DIRECTED 100 each 5  . acyclovir (ZOVIRAX) 400 MG tablet TAKE 2 TABLETS BY MOUTH TWICE DAILY 50 tablet 3  . fluticasone (FLONASE) 50 MCG/ACT nasal spray INSTILL 1 SPRAY IN EACH NOSTRIL AT BEDTIME 16 g 6  . glipiZIDE (GLUCOTROL) 10 MG tablet Take 1 tablet (10 mg total) by mouth 2 (two) times daily before a meal. 180 tablet 4  . Green Tea, Camillia sinensis, 250 MG CAPS Take 1 capsule by mouth daily.    Marland Kitchen lisinopril (PRINIVIL,ZESTRIL) 5 MG  tablet TAKE half TABLET BY MOUTH DAILY. 100 tablet 3  . metFORMIN (GLUCOPHAGE) 1000 MG tablet TAKE 1 TABLET BY MOUTH 2 TIMES DAILY WITH A MEAL. 200 tablet 4  . Multiple Vitamins-Minerals (DIASENSE MULTIVITAMIN) TABS Take 1 tablet by mouth daily.      . norethindrone-ethinyl estradiol 1/35 (NORTREL 1/35, 28,) tablet TAKE 1 TABLET BY MOUTH DAILY 84 tablet 4  . Probiotic Product (RA PROBIOTIC DIGESTIVE CARE) TABS Take 3 tablets by mouth daily.    . simvastatin (ZOCOR) 20 MG tablet TAKE 1 TABLET BY MOUTH AT BEDTIME. 100 tablet 4   No current facility-administered medications on file prior to visit.   Allergies   Allergen Reactions  . Penicillins     REACTION: RASH   Family History  Problem Relation Age of Onset  . Diabetes Other     Familly Hx First degree relative   PE: BP 124/68 mmHg  Pulse 93  Temp(Src) 98.4 F (36.9 C) (Oral)  Resp 12  Wt 157 lb 12.8 oz (71.578 kg)  SpO2 97% Body mass index is 25.85 kg/(m^2). Wt Readings from Last 3 Encounters:  09/11/14 157 lb 12.8 oz (71.578 kg)  09/07/14 151 lb (68.493 kg)  09/02/14 158 lb (71.668 kg)   Constitutional: slightly overweight, in NAD Eyes: PERRLA, EOMI, no exophthalmos ENT: moist mucous membranes, no thyromegaly, no cervical lymphadenopathy Cardiovascular: RRR, No MRG Respiratory: CTA B Gastrointestinal: abdomen soft, NT, ND, BS+ Musculoskeletal: no deformities, strength intact in all 4 Skin: moist, warm, no rashes Neurological: no tremor with outstretched hands, DTR normal in all 4  ASSESSMENT: 1. DM2, non-insulin-dependent, uncontrolled, without complications  PLAN:  1. Patient with long-standing, fairly well controlled diabetes, on oral antidiabetic regimen, which became insufficient - recent HbA1c increased to 7.6%.  - We discussed about options for treatment, and I suggested to add Invokana (she also wants to lose weight) and decrease Glipizide:  Patient Instructions  Please continue: - Metformin 1000 mg 2x a day  Please decrease: - Glipizide 5 mg 2x a day  Please start: - Invokana 100 mg in am  Please return in 1 month with your sugar log.  - we discussed about SEs of Invokana, which are: dizziness (advised to be careful when stands from sitting position), decreased BP - usually not < normal (BP today is not low), and fungal UTIs (advised to let me know if develops one).  - she refuses a referral to nutrition at this time. We did discuss about diet and she is trying to limit her meals to 45-60g carbs and her snacks to 15 g carbs. I also advised her to use the Myfitnesspal app. She had great success with Weight  Watchers in the past.  - Strongly advised her to start checking sugars at different times of the day - check 1x times a day, rotating checks - given sugar log and advised how to fill it and to bring it at next appt  - given foot care handout and explained the principles  - given instructions for hypoglycemia management "15-15 rule"  - advised for yearly eye exams >> she is UTD - for her foot side numbness, I advised her to start a B complex since she is on long-term Metformin - I also advised her to stop Lisinopril for now since no h/o HTN or proteinuria >> need an ACR check at next visit - Return to clinic in 1 mo with sugar log - will need a BMP then (since we are starting [place])

## 2014-09-11 NOTE — Patient Instructions (Addendum)
Please continue: - Metformin 1000 mg 2x a day  Please decrease: - Glipizide 5 mg 2x a day  Please start: - Invokana 100 mg in am  Please return in 1 month with your sugar log.   PATIENT INSTRUCTIONS FOR TYPE 2 DIABETES:  **Please join MyChart!** - see attached instructions about how to join if you have not done so already.  DIET AND EXERCISE Diet and exercise is an important part of diabetic treatment.  We recommended aerobic exercise in the form of brisk walking (working between 40-60% of maximal aerobic capacity, similar to brisk walking) for 150 minutes per week (such as 30 minutes five days per week) along with 3 times per week performing 'resistance' training (using various gauge rubber tubes with handles) 5-10 exercises involving the major muscle groups (upper body, lower body and core) performing 10-15 repetitions (or near fatigue) each exercise. Start at half the above goal but build slowly to reach the above goals. If limited by weight, joint pain, or disability, we recommend daily walking in a swimming pool with water up to waist to reduce pressure from joints while allow for adequate exercise.    BLOOD GLUCOSES Monitoring your blood glucoses is important for continued management of your diabetes. Please check your blood glucoses 2-4 times a day: fasting, before meals and at bedtime (you can rotate these measurements - e.g. one day check before the 3 meals, the next day check before 2 of the meals and before bedtime, etc.).   HYPOGLYCEMIA (low blood sugar) Hypoglycemia is usually a reaction to not eating, exercising, or taking too much insulin/ other diabetes drugs.  Symptoms include tremors, sweating, hunger, confusion, headache, etc. Treat IMMEDIATELY with 15 grams of Carbs: . 4 glucose tablets .  cup regular juice/soda . 2 tablespoons raisins . 4 teaspoons sugar . 1 tablespoon honey Recheck blood glucose in 15 mins and repeat above if still symptomatic/blood glucose  <100.  RECOMMENDATIONS TO REDUCE YOUR RISK OF DIABETIC COMPLICATIONS: * Take your prescribed MEDICATION(S) * Follow a DIABETIC diet: Complex carbs, fiber rich foods, (monounsaturated and polyunsaturated) fats * AVOID saturated/trans fats, high fat foods, >2,300 mg salt per day. * EXERCISE at least 5 times a week for 30 minutes or preferably daily.  * DO NOT SMOKE OR DRINK more than 1 drink a day. * Check your FEET every day. Do not wear tightfitting shoes. Contact us if you develop an ulcer * See your EYE doctor once a year or more if needed * Get a FLU shot once a year * Get a PNEUMONIA vaccine once before and once after age 39 years  GOALS:  * Your Hemoglobin A1c of <7%  * fasting sugars need to be <130 * after meals sugars need to be <180 (2h after you start eating) * Your Systolic BP should be 017 or lower  * Your Diastolic BP should be 80 or lower  * Your HDL (Good Cholesterol) should be 40 or higher  * Your LDL (Bad Cholesterol) should be 100 or lower. * Your Triglycerides should be 150 or lower  * Your Urine microalbumin (kidney function) should be <30 * Your Body Mass Index should be 25 or lower    Please consider the following ways to cut down carbs and fat and increase fiber and micronutrients in your diet: - substitute whole grain for white bread or pasta - substitute brown rice for white rice - substitute 90-calorie flat bread pieces for slices of bread when possible - substitute sweet  potatoes or yams for white potatoes - substitute humus for margarine - substitute tofu for cheese when possible - substitute almond or rice milk for regular milk (would not drink soy milk daily due to concern for soy estrogen influence on breast cancer risk) - substitute dark chocolate for other sweets when possible - substitute water - can add lemon or orange slices for taste - for diet sodas (artificial sweeteners will trick your body that you can eat sweets without getting calories and  will lead you to overeating and weight gain in the long run) - do not skip breakfast or other meals (this will slow down the metabolism and will result in more weight gain over time)  - can try smoothies made from fruit and almond/rice milk in am instead of regular breakfast - can also try old-fashioned (not instant) oatmeal made with almond/rice milk in am - order the dressing on the side when eating salad at a restaurant (pour less than half of the dressing on the salad) - eat as little meat as possible - can try juicing, but should not forget that juicing will get rid of the fiber, so would alternate with eating raw veg./fruits or drinking smoothies - use as little oil as possible, even when using olive oil - can dress a salad with a mix of balsamic vinegar and lemon juice, for e.g. - use agave nectar, stevia sugar, or regular sugar rather than artificial sweateners - steam or broil/roast veggies  - snack on veggies/fruit/nuts (unsalted, preferably) when possible, rather than processed foods - reduce or eliminate aspartame in diet (it is in diet sodas, chewing gum, etc) Read the labels!  Try to read Dr. Janene Harvey book: "Program for Reversing Diabetes" for other ideas for healthy eating.

## 2014-09-15 NOTE — Progress Notes (Signed)
Patient ID: [name] Young, female   DOB: Jul 11, 1970, 44 y.o.   MRN: [id number] Reviewed: Agree with the documentation and management of our [place].

## 2014-10-08 ENCOUNTER — Ambulatory Visit
Admission: RE | Admit: 2014-10-08 | Discharge: 2014-10-08 | Disposition: A | Discharge status: Home / Self Care | Payer: 59 | Source: Ambulatory Visit

## 2014-10-08 DIAGNOSIS — Z1231 Encounter for screening mammogram for malignant neoplasm of breast: Secondary | ICD-10-CM

## 2014-10-27 ENCOUNTER — Ambulatory Visit (INDEPENDENT_AMBULATORY_CARE_PROVIDER_SITE_OTHER): Payer: 59 | Admitting: Internal Medicine

## 2014-10-27 ENCOUNTER — Encounter: Payer: Self-pay | Admitting: Internal Medicine

## 2014-10-27 VITALS — BP 128/84 | HR 78 | Temp 98.0°F | Wt 154.0 lb

## 2014-10-27 DIAGNOSIS — E1165 Type 2 diabetes mellitus with hyperglycemia: Secondary | ICD-10-CM

## 2014-10-27 DIAGNOSIS — IMO0001 Reserved for inherently not codable concepts without codable children: Secondary | ICD-10-CM

## 2014-10-27 LAB — BASIC METABOLIC PANEL
BUN: 14 mg/dL (ref 6–23)
CHLORIDE: 104 meq/L (ref 96–112)
CO2: 28 mEq/L (ref 19–32)
Calcium: 10.2 mg/dL (ref 8.4–10.5)
Creatinine, Ser: 0.89 mg/dL (ref 0.40–1.20)
GFR: 72.48 mL/min (ref >60.00)
Glucose, Bld: 99 mg/dL (ref 70–99)
POTASSIUM: 3.9 meq/L (ref 3.5–5.1)
SODIUM: 138 meq/L (ref 135–145)

## 2014-10-27 LAB — MICROALBUMIN / CREATININE URINE RATIO
Creatinine,U: 94.2 mg/dL
Microalb Creat Ratio: 0.7 mg/g (ref 0.0–30.0)

## 2014-10-27 MED ORDER — CANAGLIFLOZIN 100 MG PO TABS: 100.0000 mg | ORAL_TABLET | Freq: Every day | ORAL | Status: DC

## 2014-10-27 NOTE — Progress Notes (Signed)
Patient ID: [name] Young, female   DOB: 10/23/1970, 44 y.o.   MRN: [id number]  HPI: [name] Young is a 44 y.o.-year-old female, returning for f/u for DM2, dx in 2008 (GDM 1998), non-insulin-dependent, uncontrolled, without complications. Last visit 1.5 mo ago.  Last hemoglobin A1c was: Lab Results  Component Value Date   HGBA1C 7.6* 08/26/2014   HGBA1C 7.5* 05/15/2014   HGBA1C 7.3* 02/09/2014   Since she was dx with DM >> she lost 50 lbs and was able to come off Lantus.  Pt is on a regimen of: - Metformin 1000 mg 2x a day - Glipizide 10 >> 5 mg 2x a day  - Invokana 100 mg - started 09/2014  Pt checks her sugars 2x a day and they are: - am: 109, 120s, 160 >> 95-137, 167 - 2h after b'fast: n/c >> 120, 225 (poptarts) - before lunch: n/c >> 85-105 - >2h after lunch: n/c >> 79-93, 194 - before dinner: n/c >> 150, 165 (not always fasting) - 2h after dinner: n/c >> 105-176, 213 - bedtime:  N/c >> 144, 157 - nighttime: n/c >> 68, 180 No lows. Lowest sugar was 50 >> 68 (at night) - 1x every 3 mo; she has hypoglycemia awareness at 60.  Highest sugar was 208 >> 225.  Glucometer: True result (has a new meter), but also AccuChek  Pt's meals are: - Breakfast: eggs, [place] bacon; green smoothies - Lunch: soup, chicken salad - Dinner: zucchini + chicken + rice - Snacks: Smoothies  - no CKD, last BUN/creatinine:  Lab Results  Component Value Date   BUN 12 08/26/2014   CREATININE 0.86 08/26/2014  On Lisinopril. - last set of lipids: Lab Results  Component Value Date   CHOL 164 02/09/2014   HDL 76.40 02/09/2014   LDLCALC 65 01/28/2013   LDLDIRECT 80.2 02/09/2014   TRIG 202.0* 02/09/2014   CHOLHDL 2 02/09/2014  On Zocor. - last eye exam was in 01/2014. No DR.  - + numbness and tingling in her foor  ROS: Constitutional: + weight loss, no fatigue, no subjective hyperthermia/hypothermia Eyes: no blurry vision, no xerophthalmia ENT: no sore throat, no nodules palpated in  throat, no dysphagia/odynophagia, no hoarseness Cardiovascular: no CP/SOB/palpitations/leg swelling Respiratory: no cough/SOB Gastrointestinal: no N/V/D/+ C Musculoskeletal: no muscle/joint aches Skin: no rashes, + hair loss Neurological: no tremors/numbness/tingling/dizziness  I reviewed pt's medications, allergies, PMH, social hx, family hx, and changes were documented in the history of present illness. Otherwise, unchanged from my initial visit note.  Past Medical History  Diagnosis Date  . Diabetes mellitus     Type II   No past surgical history on file. History   Social History  . Marital Status: Married    Spouse Name: N/A  . 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 Smoking status: Never Smoker   . Smokeless tobacco: Not on file  . Alcohol Use: Socially; liquor  . Drug Use: No   Social History Narrative   Regular exercise - 2-3x a week   Current Outpatient Prescriptions on File Prior to Visit  Medication Sig Dispense Refill  . ACCU-CHEK FASTCLIX LANCETS MISC USE AS DIRECTED 102 each 5  . ACCU-CHEK SMARTVIEW test strip USE TO TEST AS DIRECTED 100 each 5  . acyclovir (ZOVIRAX) 400 MG tablet TAKE 2 TABLETS BY MOUTH TWICE DAILY 50 tablet 3  . canagliflozin (INVOKANA) 100 MG TABS tablet Take 1 tablet (100  mg total) by mouth daily. 30 tablet 2  . fluticasone (FLONASE) 50 MCG/ACT nasal spray INSTILL 1 SPRAY IN EACH NOSTRIL AT BEDTIME 16 g 6  . glipiZIDE (GLUCOTROL) 10 MG tablet Take 0.5 tablets (5 mg total) by mouth 2 (two) times daily before a meal. 60 tablet 1  . Green Tea, Camillia sinensis, 250 MG CAPS Take 1 capsule by mouth daily.    . metFORMIN (GLUCOPHAGE) 1000 MG tablet TAKE 1 TABLET BY MOUTH 2 TIMES DAILY WITH A MEAL. 200 tablet 4  . Multiple Vitamins-Minerals (DIASENSE MULTIVITAMIN) TABS Take 1 tablet by mouth daily.      . norethindrone-ethinyl estradiol 1/35 (NORTREL 1/35, 28,) tablet TAKE 1 TABLET BY MOUTH DAILY  84 tablet 4  . Probiotic Product (RA PROBIOTIC DIGESTIVE CARE) TABS Take 3 tablets by mouth daily.    . simvastatin (ZOCOR) 20 MG tablet TAKE 1 TABLET BY MOUTH AT BEDTIME. 100 tablet 4   No current facility-administered medications on file prior to visit.   Allergies  Allergen Reactions  . Penicillins     REACTION: RASH   Family History  Problem Relation Age of Onset  . Diabetes Other     Familly Hx First degree relative   PE: BP 132/92 mmHg  Pulse 78  Temp(Src) 98 F (36.7 C) (Oral)  Wt 154 lb (69.854 kg)  SpO2 97% Body mass index is 25.23 kg/(m^2). Wt Readings from Last 3 Encounters:  10/27/14 154 lb (69.854 kg)  09/11/14 157 lb 12.8 oz (71.578 kg)  09/07/14 151 lb (68.493 kg)   Constitutional: slightly overweight, in NAD Eyes: PERRLA, EOMI, no exophthalmos ENT: moist mucous membranes, no thyromegaly, no cervical lymphadenopathy Cardiovascular: RRR, No MRG Respiratory: CTA B Gastrointestinal: abdomen soft, NT, ND, BS+ Musculoskeletal: no deformities, strength intact in all 4 Skin: moist, warm, no rashes Neurological: no tremor with outstretched hands, DTR normal in all 4  ASSESSMENT: 1. DM2, non-insulin-dependent, uncontrolled, without complications  PLAN:  1. Patient with long-standing, fairly well controlled diabetes, on oral antidiabetic regimen - recent HbA1c increased to 7.6%. At last visit, we started Invokana 100 mg daily and decreased Glipizide. Sugars improving. May need to stop Glipizide >> will let me know if lows. - I suggested to: Patient Instructions  Please stop at the lab.  Please continue: - Metformin 1000 mg 2x a day - Glipizide 5 mg 2x a day  - Invokana 100 mg daily  Please return in 1.5 month with your sugar log.   - she refused a referral to nutrition at last visit. We did discuss about diet and she is trying to limit her meals to 45-60g carbs and her snacks to 15 g carbs. I also advised her to use the Myfitnesspal app. She had great  success with Weight Watchers in the past.  - continue checking sugars at different times of the day - check 1x times a day, rotating checks - advised for yearly eye exams >> she is UTD - for her foot side numbness, I advised her to start a B complex since she is on long-term Metformin >> started - we stopped Lisinopril 2.5 mg at last visit since no h/o HTN or proteinuria >>will check an ACR now - will check a BMP now (since started Invokana) - Return to clinic in 1.5 mo with sugar log   Office Visit on 10/27/2014  Component Date Value Ref Range Status  . Sodium 10/27/2014 138  135 - 145 mEq/L Final  . Potassium 10/27/2014 3.9  3.5 -  5.1 mEq/L Final  . Chloride 10/27/2014 104  96 - 112 mEq/L Final  . CO2 10/27/2014 28  19 - 32 mEq/L Final  . Glucose, Bld 10/27/2014 99  70 - 99 mg/dL Final  . BUN 10/27/2014 14  6 - 23 mg/dL Final  . Creatinine, Ser 10/27/2014 0.89  0.40 - 1.20 mg/dL Final  . Calcium 10/27/2014 10.2  8.4 - 10.5 mg/dL Final  . GFR 10/27/2014 72.48  >60.00 mL/min Final  . Microalb, Ur 10/27/2014 <0.7  0.0 - 1.9 mg/dL Final  . Creatinine,U 10/27/2014 94.2   Final  . Microalb Creat Ratio 10/27/2014 0.7  0.0 - 30.0 mg/g Final   Normal labs. Continue Invokana.

## 2014-10-27 NOTE — Patient Instructions (Signed)
Please stop at the lab.  Please continue: - Metformin 1000 mg 2x a day - Glipizide 5 mg 2x a day  - Invokana 100 mg daily  Please return in 1.5 month with your sugar log.

## 2014-12-04 ENCOUNTER — Encounter: Payer: Self-pay | Admitting: Pharmacist

## 2014-12-10 ENCOUNTER — Ambulatory Visit (INDEPENDENT_AMBULATORY_CARE_PROVIDER_SITE_OTHER): Payer: 59 | Admitting: Internal Medicine

## 2014-12-10 ENCOUNTER — Encounter: Payer: Self-pay | Admitting: Internal Medicine

## 2014-12-10 VITALS — BP 102/64 | HR 72 | Temp 98.4°F | Resp 12 | Wt 153.0 lb

## 2014-12-10 DIAGNOSIS — E1165 Type 2 diabetes mellitus with hyperglycemia: Secondary | ICD-10-CM | POA: Diagnosis not present

## 2014-12-10 DIAGNOSIS — IMO0001 Reserved for inherently not codable concepts without codable children: Secondary | ICD-10-CM

## 2014-12-10 LAB — HEMOGLOBIN A1C: Hgb A1c MFr Bld: 7 % — ABNORMAL HIGH (ref 4.6–6.5)

## 2014-12-10 LAB — VITAMIN B12: Vitamin B-12: 220 pg/mL (ref 211–911)

## 2014-12-10 MED ORDER — SITAGLIPTIN PHOSPHATE 100 MG PO TABS: 100.0000 mg | ORAL_TABLET | Freq: Every day | ORAL | Status: DC

## 2014-12-10 NOTE — Progress Notes (Signed)
Patient ID: [name] Young, female   DOB: April 23, 1971, 44 y.o.   MRN: [id number]  HPI: Carlos E Frigon is a 44 y.o.-year-old female, returning for f/u for DM2, dx in 2008 (GDM 1998), non-insulin-dependent, uncontrolled, without complications. Last visit 1.5 mo ago.  Last hemoglobin A1c was: Lab Results  Component Value Date   HGBA1C 7.6* 08/26/2014   HGBA1C 7.5* 05/15/2014   HGBA1C 7.3* 02/09/2014   Since she was dx with DM >> she lost 50 lbs and was able to come off Lantus.  Pt is on a regimen of: - Metformin 1000 mg 2x a day - Glipizide 10 >> 5 mg 2x a day  - Invokana 100 mg - started 09/2014  Pt checks her sugars 2x a day and they are high after meals: - am: 109, 120s, 160 >> 95-137, 167 >> 96-129, 147 - 2h after b'fast: n/c >> 120, 225 (poptarts) >> 176-268 - before lunch: n/c >> 85-105 >> 65 (late lunch), 107-128 - >2h after lunch: n/c >> 79-93, 194 >> 156-243 - before dinner: n/c >> 150, 165 (not always fasting) >> 60-140 - 2h after dinner: n/c >> 105-176, 213 >> 235-298 - bedtime:  N/c >> 144, 157 >> n/c - nighttime: n/c >> 68, 180 >> n/c No lows. Lowest sugar was 50 >> 68 (at night) - 1x every 3 mo; she has hypoglycemia awareness at 60.  Highest sugar was 208 >> 225 >> 298.  Glucometer: True result (has a new meter), but also AccuChek  Pt's meals are: - Breakfast: eggs, [place] bacon; green smoothies - Lunch: soup, chicken salad - Dinner: zucchini + chicken + rice - Snacks: Smoothies  - no CKD, last BUN/creatinine:  Lab Results  Component Value Date   BUN 14 10/27/2014   CREATININE 0.89 10/27/2014  Was on Lisinopril >> stopped >> ACR normal after stopping: Microalb, Ur 10/27/2014 <0.7  0.0 - 1.9 mg/dL Final  Creatinine,U 10/27/2014 94.2   Final  Microalb Creat Ratio 10/27/2014 0.7  0.0 - 30.0 mg/g Final   - last set of lipids: Lab Results  Component Value Date   CHOL 164 02/09/2014   HDL 76.40 02/09/2014   LDLCALC 65 01/28/2013   LDLDIRECT 80.2 02/09/2014    TRIG 202.0* 02/09/2014   CHOLHDL 2 02/09/2014  On Zocor. - last eye exam was in 01/2014. No DR.  - + numbness and tingling in her foor  ROS: Constitutional: no weight loss/gain, no fatigue, no subjective hyperthermia/hypothermia Eyes: no blurry vision, no xerophthalmia ENT: no sore throat, no nodules palpated in throat, no dysphagia/odynophagia, no hoarseness Cardiovascular: no CP/SOB/palpitations/leg swelling Respiratory: no cough/SOB Gastrointestinal: no N/V/D/+ C Musculoskeletal: no muscle/joint aches Skin: no rashes, + hair loss Neurological: no tremors/numbness/tingling/dizziness  I reviewed pt's medications, allergies, PMH, social hx, family hx, and changes were documented in the history of present illness. Otherwise, unchanged from my initial visit note.  Past Medical History  Diagnosis Date  . Diabetes mellitus     Type II   No past surgical history on file. History   Social History  . Marital Status: Married    Spouse Name: N/A  . 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 Smoking status: Never Smoker   . Smokeless tobacco: Not on file  . Alcohol Use: Socially; liquor  . Drug Use: No   Social History Narrative   Regular exercise - 2-3x a week   Current Outpatient Prescriptions on  File Prior to Visit  Medication Sig Dispense Refill  . ACCU-CHEK FASTCLIX LANCETS MISC USE AS DIRECTED 102 each 5  . ACCU-CHEK SMARTVIEW test strip USE TO TEST AS DIRECTED 100 each 5  . acyclovir (ZOVIRAX) 400 MG tablet TAKE 2 TABLETS BY MOUTH TWICE DAILY (Patient not taking: Reported on 10/27/2014) 50 tablet 3  . canagliflozin (INVOKANA) 100 MG TABS tablet Take 1 tablet (100 mg total) by mouth daily. 30 tablet 2  . fluticasone (FLONASE) 50 MCG/ACT nasal spray INSTILL 1 SPRAY IN EACH NOSTRIL AT BEDTIME 16 g 6  . glipiZIDE (GLUCOTROL) 10 MG tablet Take 0.5 tablets (5 mg total) by mouth 2 (two) times daily before a meal. 60 tablet 1  .  Green Tea, Camillia sinensis, 250 MG CAPS Take 1 capsule by mouth daily.    . metFORMIN (GLUCOPHAGE) 1000 MG tablet TAKE 1 TABLET BY MOUTH 2 TIMES DAILY WITH A MEAL. 200 tablet 4  . Multiple Vitamins-Minerals (DIASENSE MULTIVITAMIN) TABS Take 1 tablet by mouth daily.      . norethindrone-ethinyl estradiol 1/35 (NORTREL 1/35, 28,) tablet TAKE 1 TABLET BY MOUTH DAILY 84 tablet 4  . Probiotic Product (RA PROBIOTIC DIGESTIVE CARE) TABS Take 3 tablets by mouth daily.    . simvastatin (ZOCOR) 20 MG tablet TAKE 1 TABLET BY MOUTH AT BEDTIME. 100 tablet 4   No current facility-administered medications on file prior to visit.   Allergies  Allergen Reactions  . Penicillins     REACTION: RASH   Family History  Problem Relation Age of Onset  . Diabetes Other     Familly Hx First degree relative   PE: BP 102/64 mmHg  Pulse 72  Temp(Src) 98.4 F (36.9 C) (Oral)  Resp 12  Wt 153 lb (69.4 kg)  SpO2 96% Body mass index is 25.06 kg/(m^2). Wt Readings from Last 3 Encounters:  12/10/14 153 lb (69.4 kg)  10/27/14 154 lb (69.854 kg)  09/11/14 157 lb 12.8 oz (71.578 kg)   Constitutional: normal weight, in NAD Eyes: PERRLA, EOMI, no exophthalmos ENT: moist mucous membranes, no thyromegaly, no cervical lymphadenopathy Cardiovascular: RRR, No MRG Respiratory: CTA B Gastrointestinal: abdomen soft, NT, ND, BS+ Musculoskeletal: no deformities, strength intact in all 4 Skin: moist, warm, no rashes Neurological: no tremor with outstretched hands, DTR normal in all 4  ASSESSMENT: 1. DM2, non-insulin-dependent, uncontrolled, without complications  PLAN:  1. Patient with long-standing, fairly well controlled diabetes, on oral antidiabetic regimen - recent HbA1c increased to 7.6%. At last visits, we started Invokana 100 mg daily and decreased Glipizide. Sugars improving, but still high after meals >> will add a DPP4 inh. I also advised her to start exercising again (used to jog) and improve her diet. - I  suggested to: Patient Instructions  Please stop at the lab.  Please continue: - Metformin 1000 mg 2x a day - Glipizide 5 mg 2x a day  - Invokana 100 mg daily  Please add Januvia 100 mg daily in am.  Please return in 3 months with your sugar log.   - she refused a referral to nutrition at last visits. She had great success with Weight Watchers in the past.  - continue checking sugars at different times of the day - check 1x times a day, rotating checks - advised for yearly eye exams >> she is UTD - for her foot side numbness, I advised her to start a B complex since she is on long-term Metformin >> she takes this, but I would like  to check a B12 level as she still has the numbness - we stopped Lisinopril 2.5 mg at last visit since no h/o HTN or proteinuria >> an ACR in 10/2014 returned normal off the Lisinopril. BPs normal at home. - will check a HbA1c today - Return to clinic in 1.5 mo with sugar log   Office Visit on 12/10/2014  Component Date Value Ref Range Status  . Hgb A1c MFr Bld 12/10/2014 7.0* 4.6 - 6.5 % Final   Glycemic Control Guidelines for People with Diabetes:Non Diabetic:  <6%Goal of Therapy: <7%Additional Action Suggested:  >8%   . Vitamin B-12 12/10/2014 220  211 - 911 pg/mL Final   Hemoglobin A1c is at goal. Vitamin B12 is very low, I suggest taking an over-the-counter supplement. Start with 2000 g daily for 2 weeks, then decrease to thousand micrograms daily. If this does not improve her vitamin B-12 levels, she may need im B12.

## 2014-12-10 NOTE — Patient Instructions (Signed)
Please stop at the lab.  Please continue: - Metformin 1000 mg 2x a day - Glipizide 5 mg 2x a day  - Invokana 100 mg daily  Please add Januvia 100 mg daily in am.  Please return in 3 months with your sugar log.

## 2014-12-13 LAB — METHYLMALONIC ACID, SERUM: Methylmalonic Acid, Quant: 113 nmol/L (ref 87–318)

## 2014-12-14 ENCOUNTER — Ambulatory Visit: Payer: 59 | Admitting: Pharmacist

## 2014-12-18 ENCOUNTER — Other Ambulatory Visit: Payer: Self-pay | Admitting: Family Medicine

## 2014-12-18 ENCOUNTER — Ambulatory Visit: Payer: 59 | Admitting: Pharmacist

## 2014-12-18 ENCOUNTER — Ambulatory Visit (INDEPENDENT_AMBULATORY_CARE_PROVIDER_SITE_OTHER): Payer: Self-pay | Admitting: Family Medicine

## 2014-12-18 VITALS — BP 126/78 | Ht 67.5 in | Wt 148.4 lb

## 2014-12-18 DIAGNOSIS — E119 Type 2 diabetes mellitus without complications: Secondary | ICD-10-CM

## 2014-12-18 NOTE — Progress Notes (Signed)
Subjective:  Patient presents today for 3 month diabetes follow-up as part of the employer-sponsored Link to Wellness program.  Current diabetes regimen includes Januvia 100 mg daily, metformin 1000 mg BID and Invokana 100 mg daily.   Patient also continues on daily  statin.  Most recent MD follow-up was with Dr. Cruzita Lederer last week.   Patient has a pending appt for September. Most recent A1C was 7.0%.  No major health changes at this time. Januvia was started last week and she reports no Adverse drug events since starting it. Lisinopril was discontinued at her last visit with endocrinologist.   Patient was not given her A1C result from her visit last week with Dr. Cruzita Lederer. I read her the result for her A1C and also Dr. Arman Filter reccomendation to start Vitamin B12 (patient was not informed of this at her visit).    Assessment/Plan:  Patient is a 44 y.o. female with DM 2. Most recent A1C was   7.0% which is at goal goal of less than 7%. Weight is stable from last visit with me.   Patient expressed an interest in stopping glipizide and asked what I thought about it. I explained to patient that her best shot at d/c glipizide was to increase physical activity and make healthier eating choices. She states she will bring it up at her next visit with Dr. Cruzita Lederer in September.   CBG Review: High/Low- 68 Having occasional low blood sugar. Correcting with orange juice or a eating a few crackers.   Lifestyle improvements:  Physical Activity-  She states she has been working more shifts lately and while she is getting 10,000 steps most days she is not doing regular physical activity. She wants to start exercising again.    Nutrition-  Patient reports no changes since last visit.  Drinking non-caloric beverages mostly. Mostly Diet Dr. Malachi Bonds, diet green tea.   Patient states that she is not getting enough water when she is working. Patient is on Invokana and encouraged patient to stay well hydrated.      Showed patient how to claim badges on the [employer] [facility] [employer] website and how to track physical activity with her fitbit app.     Follow up with me in 3 months.    Goals for Next Visit:  1. Physical Activity- start walking again. Aim for 5 days a week for 30 minutes. If you continue this for 10 weeks you will earn the physical activity badge.  2. Log your walks and workouts on the fitbit app. Then log in to [email or web address] to sync your fitbit app so it will track your workouts.  3. Increase water consumption to 2 full glasses of water each day.     Next appointment to see me is: Friday September 2nd at 10:30 AM.   Truett Mainland. Donneta Romberg, PharmD, BCPS, CDE Norm Parcel to National Coordinator [phone]

## 2014-12-29 NOTE — Progress Notes (Signed)
ATTENDING PHYSICIAN NOTE:Link to wellness Program I have reviewed the chart and agree with the plan as detailed above. Dorcas Mcmurray MD Pager [phone]

## 2015-03-03 ENCOUNTER — Other Ambulatory Visit: Payer: Self-pay | Admitting: Internal Medicine

## 2015-03-12 ENCOUNTER — Other Ambulatory Visit: Payer: Self-pay | Admitting: *Deleted

## 2015-03-12 ENCOUNTER — Other Ambulatory Visit (INDEPENDENT_AMBULATORY_CARE_PROVIDER_SITE_OTHER): Payer: 59 | Admitting: *Deleted

## 2015-03-12 ENCOUNTER — Ambulatory Visit (INDEPENDENT_AMBULATORY_CARE_PROVIDER_SITE_OTHER): Payer: Self-pay | Admitting: Family Medicine

## 2015-03-12 ENCOUNTER — Ambulatory Visit (INDEPENDENT_AMBULATORY_CARE_PROVIDER_SITE_OTHER): Payer: 59 | Admitting: Internal Medicine

## 2015-03-12 ENCOUNTER — Ambulatory Visit: Payer: 59 | Admitting: Pharmacist

## 2015-03-12 ENCOUNTER — Encounter: Payer: Self-pay | Admitting: Internal Medicine

## 2015-03-12 ENCOUNTER — Encounter: Payer: Self-pay | Admitting: Pharmacist

## 2015-03-12 VITALS — BP 102/76 | Ht 67.5 in | Wt 149.2 lb

## 2015-03-12 VITALS — BP 108/70 | HR 100 | Temp 98.3°F | Resp 12 | Wt 151.0 lb

## 2015-03-12 DIAGNOSIS — E538 Deficiency of other specified B group vitamins: Secondary | ICD-10-CM | POA: Diagnosis not present

## 2015-03-12 DIAGNOSIS — E119 Type 2 diabetes mellitus without complications: Secondary | ICD-10-CM

## 2015-03-12 DIAGNOSIS — IMO0001 Reserved for inherently not codable concepts without codable children: Secondary | ICD-10-CM

## 2015-03-12 DIAGNOSIS — E1165 Type 2 diabetes mellitus with hyperglycemia: Secondary | ICD-10-CM

## 2015-03-12 LAB — POCT GLYCOSYLATED HEMOGLOBIN (HGB A1C): Hemoglobin A1C: 6.5

## 2015-03-12 LAB — VITAMIN B12: Vitamin B-12: 923 pg/mL — ABNORMAL HIGH (ref 211–911)

## 2015-03-12 NOTE — Progress Notes (Signed)
Subjective:  Patient presents today for 3 month diabetes follow-up as part of the employer-sponsored Link to Wellness program.  Current diabetes regimen includes Invokana 100 mg daily, metformin 1000 mg BID, Januvia 100 mg daily.  Patient also continues on daily statin.  No major health changes at this time.   Patient saw Dr. Cruzita Lederer this morning. A1C at that visit was 6.5%. Glipizide dose was changed to 1/2 tablet with a large meal or dessert.   Assessment/Plan:  Patient is a 44 y.o. female with DM 2. Most recent A1C was   6.5% which is at goal of less than 7%. Weight is decreased from last visit with me.   CBG Review: Patient is checking at least once daily, sometimes twice daily.   CBG averages are about 30 points lower than her last visit, but this may be because she is mostly checking fasting now and previously was checking more post prandial visits.   High/Low- 155/45  Rare hypoglycemia- the 45 mg/dL was after she took glipizide but didn't eat. I encouraged her not to take glipizide apart from a meal (dose changed today anyways).   Lifestyle improvements:  Physical Activity-  Patient has not been engaging in regular physical activity because her work schedule has been busier and she has been working 4 days a week. 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    Nutrition-  Patient reports no changes. She has started eating smoothies again. She states she is watching her bread and carbohydrate intake more closely.    Overall patient is doing very well. Weight is down as is A1C. Encouraged patient to restart physical activity. Patient expressed a desire to try yoga. I showed her some yoga DVDs that I had tried in the past that may be beneficial for her.   Follow up with me in 3 months.    Goals for Next Visit:  1. Start walking twice a week on your off days. Look into PiYo or other yoga DVDs. The [place] might be a good place to start.  2. Drink water- aim for  at least 32 ounces daily.     Next appointment to see me is: Monday December 5th at 11 AM.    Truett Mainland. Donneta Romberg, PharmD, BCPS, CDE Norm Parcel to [place] Coordinator [phone]

## 2015-03-12 NOTE — Patient Instructions (Signed)
Please stop at the lab.  Continue: - Metformin 1000 mg 2x a day - Invokana 100 mg daily - Januvia 100 mcg daily   Stop Glipizide (only use 5 mg before a large meal or if you have dessert).  Please come back for a follow-up appointment in 3 months.

## 2015-03-12 NOTE — Progress Notes (Signed)
Patient ID: [name] Young, female   DOB: [birth year], 44 y.o.   MRN: [id number]  HPI: [name] Young is a 44 y.o.-year-old female, returning for f/u for DM2, dx in 2008 (GDM 1998), non-insulin-dependent, uncontrolled, without complications. Last visit 3 mo ago.  DM2: Last hemoglobin A1c was: Lab Results  Component Value Date   HGBA1C 7.0* 12/10/2014   HGBA1C 7.6* 08/26/2014   HGBA1C 7.5* 05/15/2014   Since she was dx with DM >> she lost 50 lbs and was able to come off Lantus.  Pt is on a regimen of: - Metformin 1000 mg 2x a day - Glipizide 10 >> 5 mg 2x a day  - Invokana 100 mg - started 09/2014 - Januvia 100 mcg daily - started 12/2014  Pt checks her sugars 2x a day and they are much better: - am: 109, 120s, 160 >> 95-137, 167 >> 96-129, 147 >> 74, 84-115 - 2h after b'fast: n/c >> 120, 225 (poptarts) >> 176-268 >> 89 - before lunch: n/c >> 85-105 >> 65 (late lunch), 107-128 >> 83, 105 - >2h after lunch: n/c >> 79-93, 194 >> 156-243 >> 174 - before dinner: n/c >> 150, 165 (not always fasting) >> 60-140 >> 68-99 - 2h after dinner: n/c >> 105-176, 213 >> 235-298 >> 88-136 - bedtime:  N/c >> 144, 157 >> n/c - nighttime: n/c >> 68, 180 >> n/c No lows. Lowest sugar was 68; she has hypoglycemia awareness at 60.  Highest sugar was 208 >> 225 >> 298 >> 174.  Glucometer: True result (has a new meter), but also AccuChek  Pt's meals are: - Breakfast: eggs, [place] bacon; green smoothies - Lunch: soup, chicken salad - Dinner: zucchini + chicken + rice - Snacks: Smoothies  - no CKD, last BUN/creatinine:  Lab Results  Component Value Date   BUN 14 10/27/2014   CREATININE 0.89 10/27/2014  Was on Lisinopril >> stopped >> ACR normal after stopping: Microalb, Ur 10/27/2014 <0.7  0.0 - 1.9 mg/dL Final  Creatinine,U 10/27/2014 94.2   Final  Microalb Creat Ratio 10/27/2014 0.7  0.0 - 30.0 mg/g Final   - last set of lipids: Lab Results  Component Value Date   CHOL 164 02/09/2014   HDL 76.40 02/09/2014   LDLCALC 65 01/28/2013   LDLDIRECT 80.2 02/09/2014   TRIG 202.0* 02/09/2014   CHOLHDL 2 02/09/2014  On Zocor. - last eye exam was in 01/2014. No DR.  - + numbness and tingling in her foot  Vit B12 def. - At last visit, we checked a vitamin B12 level, and this was low, at 220: Component     Latest Ref Rng 12/10/2014  Vitamin B-12     211 - 911 pg/mL 220  Methylmalonic Acid, Quant     87 - 318 nmol/L 113  - I recommended that she starts B12 supplementation, at 2000 g daily for 2 weeks, then decrease to 1000 mcg daily. She is now on 1000 mcg daily.   ROS: Constitutional: no weight loss/gain, no fatigue, no subjective hyperthermia/hypothermia Eyes: no blurry vision, no xerophthalmia ENT: no sore throat, no nodules palpated in throat, no dysphagia/odynophagia, no hoarseness Cardiovascular: no CP/SOB/palpitations/leg swelling Respiratory: no cough/SOB Gastrointestinal: no N/V/D/+ C Musculoskeletal: no muscle/joint aches Skin: no rashes, + hair loss Neurological: no tremors/numbness/tingling/dizziness  I reviewed pt's medications, allergies, PMH, social hx, family hx, and changes were documented in the history of present illness. Otherwise, unchanged from my initial visit note.  Past Medical History  Diagnosis Date  .  Diabetes mellitus     Type II   No past surgical history on file. History   Social History  . Marital Status: Married    Spouse Name: N/A  . 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 Smoking status: Never Smoker   . Smokeless tobacco: Not on file  . Alcohol Use: Socially; liquor  . Drug Use: No   Social History Narrative   Regular exercise - 2-3x a week   Current Outpatient Prescriptions on File Prior to Visit  Medication Sig Dispense Refill  . ACCU-CHEK FASTCLIX LANCETS MISC USE AS DIRECTED 102 each 5  . ACCU-CHEK SMARTVIEW test strip USE TO TEST AS DIRECTED 100 each 5  . acyclovir  (ZOVIRAX) 400 MG tablet TAKE 2 TABLETS BY MOUTH TWICE DAILY 50 tablet 3  . canagliflozin (INVOKANA) 100 MG TABS tablet Take 1 tablet (100 mg total) by mouth daily. 30 tablet 2  . fluticasone (FLONASE) 50 MCG/ACT nasal spray INSTILL 1 SPRAY IN EACH NOSTRIL AT BEDTIME 16 g 6  . glipiZIDE (GLUCOTROL) 10 MG tablet Take 0.5 tablets (5 mg total) by mouth 2 (two) times daily before a meal. 60 tablet 1  . INVOKANA 100 MG TABS tablet TAKE 1 TABLET BY MOUTH DAILY. 30 tablet 2  . metFORMIN (GLUCOPHAGE) 1000 MG tablet TAKE 1 TABLET BY MOUTH 2 TIMES DAILY WITH A MEAL. 200 tablet 4  . Multiple Vitamins-Minerals (DIASENSE MULTIVITAMIN) TABS Take 1 tablet by mouth daily.      . norethindrone-ethinyl estradiol 1/35 (NORTREL 1/35, 28,) tablet TAKE 1 TABLET BY MOUTH DAILY 84 tablet 4  . simvastatin (ZOCOR) 20 MG tablet TAKE 1 TABLET BY MOUTH AT BEDTIME. 100 tablet 4  . sitaGLIPtin (JANUVIA) 100 MG tablet Take 1 tablet (100 mg total) by mouth daily. 30 tablet 3   No current facility-administered medications on file prior to visit.   Allergies  Allergen Reactions  . Penicillins     REACTION: RASH   Family History  Problem Relation Age of Onset  . Diabetes Other     Familly Hx First degree relative   PE: BP 108/70 mmHg  Pulse 100  Temp(Src) 98.3 F (36.8 C) (Oral)  Resp 12  Wt 151 lb (68.493 kg)  SpO2 96% Body mass index is 23.29 kg/(m^2). Wt Readings from Last 3 Encounters:  03/12/15 151 lb (68.493 kg)  12/18/14 148 lb 6.4 oz (67.314 kg)  12/10/14 153 lb (69.4 kg)   Constitutional: normal weight, in NAD Eyes: PERRLA, EOMI, no exophthalmos ENT: moist mucous membranes, no thyromegaly, no cervical lymphadenopathy Cardiovascular: RRR, No MRG Respiratory: CTA B Gastrointestinal: abdomen soft, NT, ND, BS+ Musculoskeletal: no deformities, strength intact in all 4 Skin: moist, warm, no rashes Neurological: no tremor with outstretched hands, DTR normal in all 4  ASSESSMENT: 1. DM2,  non-insulin-dependent, uncontrolled, without complications  2. B12 deficiency  PLAN:  1. Patient with long-standing, fairly well controlled diabetes, on oral antidiabetic regimen - now with much better sugars after adding Januvia. - I suggested to stop Glipizide (only use it prn for larger meals): Patient Instructions  Please stop at the lab.  Continue: - Metformin 1000 mg 2x a day - Invokana 100 mg daily - Januvia 100 mcg daily   Stop Glipizide (only use 5 mg before a large meal or if you have dessert).  Please come back for a follow-up appointment in 3 months.  - she refused a referral to  nutrition at last visits. She had great success with Weight Watchers in the past. She is close to goal weight. - continue checking sugars at different times of the day - check 1x times a day, rotating checks - advised for yearly eye exams >> she needs one - we stopped Lisinopril 2.5 mg in the past since no h/o HTN or proteinuria >> an ACR in 10/2014 returned normal off the Lisinopril. BPs normal. - will check a HbA1c today >> 6.5% (better!) - Return to clinic in 3 mo with sugar log   2. B12 deficiency - started 2000 mcg at last visit >> now decreased to 1000 mcg daily >> recheck level today. If this does not improve her vitamin B-12 levels, she may need im B12.   Orders Only on 03/12/2015  Component Date Value Ref Range Status  . Hemoglobin A1C 03/12/2015 6.5   Final  Office Visit on 03/12/2015  Component Date Value Ref Range Status  . Vitamin B-12 03/12/2015 923* 211 - 911 pg/mL Final   Great improvement in B12 vitamin. Will continue current po B12 vitamin dose.

## 2015-03-17 NOTE — Progress Notes (Signed)
ATTENDING PHYSICIAN NOTE: I have reviewed the chart and agree with the plan as detailed above. Yaneliz Radebaugh MD Pager [phone]  

## 2015-03-22 ENCOUNTER — Other Ambulatory Visit (INDEPENDENT_AMBULATORY_CARE_PROVIDER_SITE_OTHER): Payer: 59

## 2015-03-22 DIAGNOSIS — R7989 Other specified abnormal findings of blood chemistry: Secondary | ICD-10-CM | POA: Diagnosis not present

## 2015-03-22 DIAGNOSIS — Z0000 Encounter for general adult medical examination without abnormal findings: Secondary | ICD-10-CM

## 2015-03-22 LAB — LIPID PANEL
Cholesterol: 160 mg/dL (ref 0–200)
HDL: 61.9 mg/dL (ref >39.00)
NONHDL: 98.42
TRIGLYCERIDES: 206 mg/dL — AB (ref 0.0–149.0)
Total CHOL/HDL Ratio: 3
VLDL: 41.2 mg/dL — AB (ref 0.0–40.0)

## 2015-03-22 LAB — LDL CHOLESTEROL, DIRECT: Direct LDL: 62 mg/dL

## 2015-03-22 LAB — POCT URINALYSIS DIPSTICK
Bilirubin, UA: NEGATIVE
Blood, UA: NEGATIVE
Ketones, UA: NEGATIVE
Leukocytes, UA: NEGATIVE
Nitrite, UA: NEGATIVE
PH UA: 6.5
PROTEIN UA: NEGATIVE
SPEC GRAV UA: 1.02
UROBILINOGEN UA: 0.2

## 2015-03-22 LAB — HEPATIC FUNCTION PANEL
ALK PHOS: 42 U/L (ref 39–117)
ALT: 19 U/L (ref 0–35)
AST: 19 U/L (ref 0–37)
Albumin: 3.9 g/dL (ref 3.5–5.2)
BILIRUBIN DIRECT: 0.1 mg/dL (ref 0.0–0.3)
BILIRUBIN TOTAL: 0.6 mg/dL (ref 0.2–1.2)
Total Protein: 6.7 g/dL (ref 6.0–8.3)

## 2015-03-22 LAB — MICROALBUMIN / CREATININE URINE RATIO
Creatinine,U: 97.9 mg/dL
Microalb Creat Ratio: 0.7 mg/g (ref 0.0–30.0)

## 2015-03-22 LAB — CBC WITH DIFFERENTIAL/PLATELET
BASOS ABS: 0 10*3/uL (ref 0.0–0.1)
Basophils Relative: 0.4 % (ref 0.0–3.0)
EOS ABS: 0.2 10*3/uL (ref 0.0–0.7)
Eosinophils Relative: 3.4 % (ref 0.0–5.0)
HCT: 40.1 % (ref 36.0–46.0)
Hemoglobin: 13.6 g/dL (ref 12.0–15.0)
LYMPHS ABS: 1.8 10*3/uL (ref 0.7–4.0)
Lymphocytes Relative: 28.5 % (ref 12.0–46.0)
MCHC: 33.9 g/dL (ref 30.0–36.0)
MCV: 90.3 fl (ref 78.0–100.0)
MONO ABS: 0.5 10*3/uL (ref 0.1–1.0)
MONOS PCT: 7.7 % (ref 3.0–12.0)
NEUTROS ABS: 3.9 10*3/uL (ref 1.4–7.7)
NEUTROS PCT: 60 % (ref 43.0–77.0)
PLATELETS: 244 10*3/uL (ref 150.0–400.0)
RBC: 4.44 Mil/uL (ref 3.87–5.11)
RDW: 13.1 % (ref 11.5–15.5)
WBC: 6.4 10*3/uL (ref 4.0–10.5)

## 2015-03-22 LAB — BASIC METABOLIC PANEL
BUN: 12 mg/dL (ref 6–23)
CALCIUM: 9.2 mg/dL (ref 8.4–10.5)
CO2: 28 mEq/L (ref 19–32)
CREATININE: 0.86 mg/dL (ref 0.40–1.20)
Chloride: 101 mEq/L (ref 96–112)
GFR: 75.28 mL/min (ref >60.00)
GLUCOSE: 128 mg/dL — AB (ref 70–99)
Potassium: 4.3 mEq/L (ref 3.5–5.1)
SODIUM: 138 meq/L (ref 135–145)

## 2015-03-22 LAB — HEMOGLOBIN A1C: HEMOGLOBIN A1C: 6.8 % — AB (ref 4.6–6.5)

## 2015-03-22 LAB — TSH: TSH: 2.16 u[IU]/mL (ref 0.35–4.50)

## 2015-03-29 ENCOUNTER — Ambulatory Visit (INDEPENDENT_AMBULATORY_CARE_PROVIDER_SITE_OTHER): Payer: 59 | Admitting: Family Medicine

## 2015-03-29 ENCOUNTER — Encounter: Payer: Self-pay | Admitting: Family Medicine

## 2015-03-29 VITALS — BP 110/70 | Temp 98.6°F | Ht 67.0 in | Wt 146.0 lb

## 2015-03-29 DIAGNOSIS — B009 Herpesviral infection, unspecified: Secondary | ICD-10-CM | POA: Diagnosis not present

## 2015-03-29 DIAGNOSIS — Z23 Encounter for immunization: Secondary | ICD-10-CM

## 2015-03-29 DIAGNOSIS — IMO0001 Reserved for inherently not codable concepts without codable children: Secondary | ICD-10-CM

## 2015-03-29 DIAGNOSIS — E119 Type 2 diabetes mellitus without complications: Secondary | ICD-10-CM

## 2015-03-29 DIAGNOSIS — E1165 Type 2 diabetes mellitus with hyperglycemia: Secondary | ICD-10-CM

## 2015-03-29 DIAGNOSIS — Z0000 Encounter for general adult medical examination without abnormal findings: Secondary | ICD-10-CM

## 2015-03-29 DIAGNOSIS — E785 Hyperlipidemia, unspecified: Secondary | ICD-10-CM

## 2015-03-29 DIAGNOSIS — I341 Nonrheumatic mitral (valve) prolapse: Secondary | ICD-10-CM | POA: Insufficient documentation

## 2015-03-29 DIAGNOSIS — E538 Deficiency of other specified B group vitamins: Secondary | ICD-10-CM

## 2015-03-29 DIAGNOSIS — N938 Other specified abnormal uterine and vaginal bleeding: Secondary | ICD-10-CM

## 2015-03-29 DIAGNOSIS — T7840XD Allergy, unspecified, subsequent encounter: Secondary | ICD-10-CM

## 2015-03-29 MED ORDER — METFORMIN HCL 1000 MG PO TABS: ORAL_TABLET | ORAL | Status: DC

## 2015-03-29 MED ORDER — ACYCLOVIR 400 MG PO TABS: 800.0000 mg | ORAL_TABLET | Freq: Two times a day (BID) | ORAL | Status: DC

## 2015-03-29 MED ORDER — FLUTICASONE PROPIONATE 50 MCG/ACT NA SUSP: NASAL | Status: DC

## 2015-03-29 MED ORDER — NORETHINDRONE-ETH ESTRADIOL 1-35 MG-MCG PO TABS: ORAL_TABLET | ORAL | Status: DC

## 2015-03-29 NOTE — Patient Instructions (Addendum)
Continue current medications  Follow-up in one year sooner if any problems  Your B12 level is 900. It was 220......... nor normal 211........ so I just take the B12 1 tablet weekly

## 2015-03-29 NOTE — Progress Notes (Signed)
   Subjective:    Patient ID: [name] Young, female    DOB: 04/20/1971, 44 y.o.   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 Lorie Apley because of her blood sugar being elevated. Dr. Lorie Apley has got her blood sugar under control with diet exercise Invokana 100 mg daily, Januvia 100 mg daily, metformin 1000 mg twice a day, and Glucotrol 5 mg twice a day when necessary recent A1c 6.5  She was diagnosed to have low B12 by her endocrinologist. She's been on oral B12 for about 3 months. She was told the oral medication didn't work she would need an injectable B12  Uses Flonase for allergic rhinitis  He uses BCPs because it dysfunctional uterine bleeding. Beast last menstrual period about 3 and half weeks ago. She takes Zocor 20 mg daily because of hyperlipidemia  She gets routine eye care, dental care, BSE monthly, annual mammography, never had a colonoscopy not indicated until age 60. Pap smear last year normal no GYN complaints therefore every 2-3 years on her Pap smears.   Review of Systems  Constitutional: Negative.   HENT: Negative.   Eyes: Negative.   Respiratory: Negative.   Cardiovascular: Negative.   Gastrointestinal: Negative.   Endocrine: Negative.   Genitourinary: Negative.   Musculoskeletal: Negative.   Skin: Negative.   Allergic/Immunologic: Negative.   Neurological: Negative.   Hematological: Negative.   Psychiatric/Behavioral: Negative.        Objective:   Physical Exam  Constitutional: She appears well-developed and well-nourished.  HENT:  Head: Normocephalic and atraumatic.  Right Ear: External ear normal.  Left Ear: External ear normal.  Nose: Nose normal.  Mouth/Throat: Oropharynx is clear and moist.    Eyes: EOM are normal. Pupils are equal, round, and reactive to light.  Neck: Normal range of motion. Neck supple. No JVD present. No tracheal deviation present. No thyromegaly present.  Cardiovascular: Normal rate, regular rhythm and intact distal pulses.  Exam reveals no gallop and no friction rub.   Murmur heard. No carotid nor aortic bruits peripheral pulses 2+ and symmetrical  New  heart murmur mitral valve click no murmur  Pulmonary/Chest: Effort normal and breath sounds normal. No stridor. No respiratory distress. She has no wheezes. She has no rales. She exhibits no tenderness.  Abdominal: Soft. Bowel sounds are normal. She exhibits no distension and no mass. There is no tenderness. There is no rebound and no guarding.  Genitourinary:  Bilateral breast exam normal pelvic and rectal deferred  Musculoskeletal: Normal range of motion.  Lymphadenopathy:    She has no cervical adenopathy.  Neurological: She is alert. She has normal reflexes. No cranial nerve deficit. She exhibits normal muscle tone. Coordination normal.  Skin: Skin is warm and dry. No rash noted. No erythema. No pallor.  Psychiatric: She has a normal mood and affect. Her behavior is normal. Judgment and thought content normal.  Nursing note and vitals reviewed.         Assessment & Plan:  Healthy female  Diabetes type 2 at goal.....Marland Kitchen continue current therapy  Hyperlipidemia goal.....Marland Kitchen continue current therapy  Dysfunction uterine bleeding....... continue BCPs  History of allergic rhinitis........ Flonase when necessary  History of HSV-1........ acyclovir when necessary  New heart murmur...Marland KitchenMarland KitchenMarland Kitchen mitral valve click...Marland KitchenMarland KitchenMarland Kitchen reassured

## 2015-03-29 NOTE — Progress Notes (Signed)
Pre visit review using our clinic review tool, if applicable. No additional management support is needed unless otherwise documented below in the visit note. 

## 2015-04-01 LAB — QUANTIFERON TB GOLD ASSAY (BLOOD)
Interferon Gamma Release Assay: NEGATIVE
Mitogen value: 9.44 IU/mL
Quantiferon Nil Value: 0.04 IU/mL
Quantiferon Tb Ag Minus Nil Value: -0.01 IU/mL
TB Ag value: 0.03 IU/mL

## 2015-04-05 ENCOUNTER — Other Ambulatory Visit: Payer: Self-pay | Admitting: Family Medicine

## 2015-04-05 ENCOUNTER — Other Ambulatory Visit: Payer: Self-pay | Admitting: Internal Medicine

## 2015-04-06 ENCOUNTER — Telehealth: Payer: Self-pay | Admitting: Family Medicine

## 2015-04-06 NOTE — Telephone Encounter (Signed)
Pt call to ask for if a copy of the  Quantiferon tb and the flu shot can be sent to her

## 2015-05-13 ENCOUNTER — Other Ambulatory Visit: Payer: Self-pay | Admitting: Family Medicine

## 2015-06-04 ENCOUNTER — Other Ambulatory Visit: Payer: Self-pay | Admitting: Internal Medicine

## 2015-06-07 ENCOUNTER — Telehealth: Payer: Self-pay | Admitting: Internal Medicine

## 2015-06-07 MED ORDER — CANAGLIFLOZIN 100 MG PO TABS: 100.0000 mg | ORAL_TABLET | Freq: Every day | ORAL | Status: DC

## 2015-06-07 NOTE — Telephone Encounter (Signed)
Refill sent to [facility].

## 2015-06-07 NOTE — Telephone Encounter (Signed)
Please call invokana into [facility] please she just took her last pill

## 2015-06-14 ENCOUNTER — Encounter: Payer: Self-pay | Admitting: Pharmacist

## 2015-06-14 ENCOUNTER — Ambulatory Visit (INDEPENDENT_AMBULATORY_CARE_PROVIDER_SITE_OTHER): Payer: 59 | Admitting: Internal Medicine

## 2015-06-14 ENCOUNTER — Other Ambulatory Visit (INDEPENDENT_AMBULATORY_CARE_PROVIDER_SITE_OTHER): Payer: 59 | Admitting: *Deleted

## 2015-06-14 ENCOUNTER — Ambulatory Visit (INDEPENDENT_AMBULATORY_CARE_PROVIDER_SITE_OTHER): Payer: Self-pay | Admitting: Family Medicine

## 2015-06-14 ENCOUNTER — Encounter: Payer: Self-pay | Admitting: Internal Medicine

## 2015-06-14 VITALS — BP 102/60 | HR 93 | Temp 98.5°F | Resp 12 | Wt 153.0 lb

## 2015-06-14 VITALS — BP 96/62 | Ht 67.0 in | Wt 151.6 lb

## 2015-06-14 DIAGNOSIS — E1165 Type 2 diabetes mellitus with hyperglycemia: Secondary | ICD-10-CM | POA: Diagnosis not present

## 2015-06-14 DIAGNOSIS — IMO0001 Reserved for inherently not codable concepts without codable children: Secondary | ICD-10-CM

## 2015-06-14 DIAGNOSIS — E119 Type 2 diabetes mellitus without complications: Secondary | ICD-10-CM

## 2015-06-14 LAB — POCT GLYCOSYLATED HEMOGLOBIN (HGB A1C): Hemoglobin A1C: 7.6

## 2015-06-14 MED ORDER — GLIPIZIDE ER 5 MG PO TB24: 5.0000 mg | ORAL_TABLET | Freq: Every day | ORAL | Status: DC

## 2015-06-14 NOTE — Patient Instructions (Addendum)
Please continue: - Metformin 1000 mg 2x a day - Invokana 100 mg daily - Januvia 100 mcg daily   Please add Glipizide XL 5 mg before b'fast.  Use 5 mg regular Glipizide before a large meal.  Please come back for a follow-up appointment in 3 months.  Plant-based diet materials: - Lectures (you tube):  Alyssa Grove: "Breaking the Food Seduction"  Doug Lisle: "How to Lose Weight, without Losing Your Mind"  Shari Heritage: "What is Insulin Resistance" [email or web address] - Documentaries:  [place] over [employer], Sick and Nearly Dead  The [employer]  Overweight and undernourished - Books:  Alyssa Grove: "Program for Reversing Diabetes"  Heath Gold: "The [place] Study"  Norma Fredrickson: "Supermarket Vegan" (cookbook) - Facebook pages:   [place] versus Knives  Vegucated  [place] Matters - Healthy nutrition info websites:  https://www.martin.info/

## 2015-06-14 NOTE — Progress Notes (Signed)
Patient ID: [name] Young, female   DOB: 01/10/1971, 44 y.o.   MRN: [id number]  HPI: [name] Young is a 44 y.o.-year-old female, returning for f/u for DM2, dx in 2008 (GDM 1998), non-insulin-dependent, uncontrolled, without complications. Last visit 3 mo ago.  DM2: Last hemoglobin A1c was: Lab Results  Component Value Date   HGBA1C 6.8* 03/22/2015   HGBA1C 6.5 03/12/2015   HGBA1C 7.0* 12/10/2014   Since she was dx with DM >> she lost 50 lbs and was able to come off Lantus.  Pt is on a regimen of: - Metformin 1000 mg 2x a day - Invokana 100 mg - started 09/2014 - Januvia 100 mcg daily - started 12/2014 At last visit, we stopped Glipizide 5 mg bid, but she did not start Glipizide 5 mg before a large meal.  Pt checks her sugars 2x a day and they are higher: - am: 109, 120s, 160 >> 95-137, 167 >> 96-129, 147 >> 74, 84-115 >> 116-212 - 2h after b'fast: n/c >> 120, 225 (poptarts) >> 176-268 >> 89 >> 305 - before lunch: n/c >> 85-105 >> 65 (late lunch), 107-128 >> 83, 105 >> 141 - >2h after lunch: n/c >> 79-93, 194 >> 156-243 >> 174  >> 159-209 - before dinner: n/c >> 150, 165 (not always fasting) >> 60-140 >> 68-99 >> 107-130, 247 - 2h after dinner: n/c >> 105-176, 213 >> 235-298 >> 88-136 >> 195 - bedtime:  N/c >> 144, 157 >> n/c  - nighttime: n/c >> 68, 180 >> n/c >> 58 (skipped dinner) No lows. Lowest sugar was 68; she has hypoglycemia awareness at 60.  Highest sugar was 208 >> 225 >> 298 >> 174.  Glucometer: True result (has a new meter), but also AccuChek  Pt's meals are: - Breakfast: eggs, [place] bacon; green smoothies - Lunch: soup, chicken salad - Dinner: zucchini + chicken + rice - Snacks: Smoothies  - no CKD, last BUN/creatinine:  Lab Results  Component Value Date   BUN 12 03/22/2015   CREATININE 0.86 03/22/2015  Was on Lisinopril >> stopped >> ACR normal after stopping: Microalb, Ur 10/27/2014 <0.7  0.0 - 1.9 mg/dL Final  Creatinine,U 10/27/2014 94.2   Final   Microalb Creat Ratio 10/27/2014 0.7  0.0 - 30.0 mg/g Final   - last set of lipids: Lab Results  Component Value Date   CHOL 160 03/22/2015   HDL 61.90 03/22/2015   LDLCALC 65 01/28/2013   LDLDIRECT 62.0 03/22/2015   TRIG 206.0* 03/22/2015   CHOLHDL 3 03/22/2015  On Zocor. - last eye exam was in 01/2014. No DR.  - + numbness and tingling in her foot  Vit B12 def. - normalized at last check, on 1000 mcg daily now   ROS: Constitutional: no weight loss/gain, no fatigue, no subjective hyperthermia/hypothermia Eyes: no blurry vision, no xerophthalmia ENT: no sore throat, no nodules palpated in throat, no dysphagia/odynophagia, no hoarseness Cardiovascular: no CP/SOB/palpitations/leg swelling Respiratory: no cough/SOB Gastrointestinal: no N/V/D/C Musculoskeletal: no muscle/joint aches Skin: no rashes, + hair loss Neurological: no tremors/numbness/tingling/dizziness  I reviewed pt's medications, allergies, PMH, social hx, family hx, and changes were documented in the history of present illness. Otherwise, unchanged from my initial visit note.  Past Medical History  Diagnosis Date  . Diabetes mellitus     Type II   No past surgical history on file. History   Social History  . Marital Status: Married    Spouse Name: N/A  . Number of Children: 1  Occupational History  . RN [employer]   Social History Main Topics  . Smoking status: Never Smoker   . Smokeless tobacco: Not on file  . Alcohol Use: Socially; liquor  . Drug Use: No   Social History Narrative   Regular exercise - 2-3x a week   Current Outpatient Prescriptions on File Prior to Visit  Medication Sig Dispense Refill  . ACCU-CHEK FASTCLIX LANCETS MISC USE AS DIRECTED 102 each 5  . ACCU-CHEK SMARTVIEW test strip USE TO TEST AS DIRECTED 100 each 5  . acyclovir (ZOVIRAX) 400 MG tablet Take 2 tablets (800 mg total) by mouth 2 (two) times daily. 50 tablet 3  . b complex vitamins tablet Take 1 tablet by mouth daily.     . canagliflozin (INVOKANA) 100 MG TABS tablet Take 1 tablet (100 mg total) by mouth daily. 30 tablet 2  . cholecalciferol (VITAMIN D) 1000 UNITS tablet Take 1,000 Units by mouth daily.    . fluticasone (FLONASE) 50 MCG/ACT nasal spray INSTILL 1 SPRAY IN EACH NOSTRIL AT BEDTIME 16 g 6  . JANUVIA 100 MG tablet TAKE 1 TABLET BY MOUTH DAILY. 30 tablet 3  . metFORMIN (GLUCOPHAGE) 1000 MG tablet TAKE 1 TABLET BY MOUTH 2 TIMES DAILY WITH A MEAL. 200 tablet 4  . Multiple Vitamins-Minerals (DIASENSE MULTIVITAMIN) TABS Take 1 tablet by mouth daily.      . norethindrone-ethinyl estradiol 1/35 (NORTREL 1/35, 28,) tablet TAKE 1 TABLET BY MOUTH DAILY 84 tablet 4  . NORTREL 1/35, 28, tablet TAKE 1 TABLET BY MOUTH DAILY 84 tablet 3  . simvastatin (ZOCOR) 20 MG tablet TAKE 1 TABLET BY MOUTH AT BEDTIME. 100 tablet 4  . vitamin B-12 (CYANOCOBALAMIN) 1000 MCG tablet Take 1,000 mcg by mouth 3 (three) times a week.     Marland Kitchen glipiZIDE (GLUCOTROL) 10 MG tablet Take 0.5 tablets (5 mg total) by mouth 2 (two) times daily before a meal. (Patient not taking: Reported on 06/14/2015) 60 tablet 1   No current facility-administered medications on file prior to visit.   Allergies  Allergen Reactions  . Penicillins     REACTION: RASH   Family History  Problem Relation Age of Onset  . Diabetes Other     Familly Hx First degree relative   PE: BP 102/60 mmHg  Pulse 93  Temp(Src) 98.5 F (36.9 C) (Oral)  Resp 12  Wt 153 lb (69.4 kg)  SpO2 95% Body mass index is 23.96 kg/(m^2). Wt Readings from Last 3 Encounters:  06/14/15 153 lb (69.4 kg)  03/29/15 146 lb (66.225 kg)  03/12/15 149 lb 3.2 oz (67.677 kg)   Constitutional: normal weight, in NAD Eyes: PERRLA, EOMI, no exophthalmos ENT: moist mucous membranes, no thyromegaly, no cervical lymphadenopathy Cardiovascular: RRR, No MRG Respiratory: CTA B Gastrointestinal: abdomen soft, NT, ND, BS+ Musculoskeletal: no deformities, strength intact in all 4 Skin: moist, warm,  no rashes Neurological: no tremor with outstretched hands, DTR normal in all 4  ASSESSMENT: 1. DM2, non-insulin-dependent, uncontrolled, without complications  2. B12 deficiency  PLAN:  1. Patient with long-standing, fairly well controlled diabetes, on oral antidiabetic regimen - now with worse sugars after stopping Glipizide. - I suggested to start Glipizide XL in am and use 5 mg reg glipizide before a larger meal  Patient Instructions  Please continue: - Metformin 1000 mg 2x a day - Invokana 100 mg daily - Januvia 100 mcg daily   Please add Glipizide XL 5 mg before b'fast.  Use 5 mg regular Glipizide before  a large meal.  Please come back for a follow-up appointment in 3 months.  - discussed about insulin resistance and gave her references to further read about it - continue checking sugars at different times of the day - check 1x times a day, rotating checks - advised for yearly eye exams >> she needs one - will check a HbA1c today >> 7.6% (higher) - Return to clinic in 3 mo with sugar log   2. B12 deficiency - started 2000 mcg >> decreased to 1000 mcg daily >> rechecked level was normal at last visit >> recheck at next visit

## 2015-06-14 NOTE — Progress Notes (Signed)
Subjective:  Patient presents today for 3 month diabetes follow-up as part of the employer-sponsored Link to Wellness program.  Current diabetes regimen includes Januvia 100 mg daily, metformin 1000 mg BID, Invokana 100 mg daily, glipizide 5 mg with a large meal.  Patient also continues on daily statin.  Most recent MD follow-up was with Dr. Cruzita Lederer today. A1C was 7.6%. Patient has a pending appt for March with Dr. Cruzita Lederer. No major health changes at this time.   1 medication change- glipizide 5 mg XL once daily started today, along with a glipizide 5 mg immediate release tablet with a large meal or with dessert.     Assessment:  Diabetes: Most recent A1C was 7.6  % which is exceeding goal of less than 7%. Weight is increased slightly from last visit with me.   A1C is increased, likely secondary to eating out more frequently and because of a decrease in physical activity. Reviewed nutrition information with patients on common foods that she eats and places that she eats out.    CBG Review: Patient did not bring meter with her to the appointment. She is checking twice daily fasting and then either around lunch or before dinner.   2 low blood sugars in the past 3 months- both were in the middle of the night and they were after she skipped dinner. Counseled patient that now she is on glipizide xl daily not to skip meals to avoid hyopglycemia.   She reports on average fasting CBG has been running in the 170-180s. Pre dinner- in the low 100s (this is usually after working a 12 hour shift).   High/Low- low 300s (after a piece of chocolate cake)/50s    Lifestyle improvements:  Physical Activity-  She was walking twice a week earlier in the fall but lately she hasn't been walking extra. She is getting 8-10K steps daily. On previous visits with me she has been walking for several miles multiple days a week so this has been a change for her.   She is not interested in setting a physical activity  goal at this time. She is working 4 days a week, 12 hour shifts and states she does not have much time for additional activity.   Nutrition-  Overall she is eating out more than usual- fast food and sit down restaurants.   B- Patient has been eating omelettes in the mornings that she works. Dr. Cruzita Lederer asked that she cut back on these because of the fat. On the days she doesn't work she isn't always working.  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 Sometimes she is getting salads, sometimes chicken tenders. Sometimes [employer].  D- Not cooking very much- she is stopping for food on the way home- mostly fast food.   She is also drinking 2 servings of mixed drinks during the week.    Follow up with me in 3 months.    Plan/Goals for Next Visit:  1. Cut back on eating out. Limit to 3 days a week. Look at nutrition information at the foods you are eating at the cafeteria. Aim for 45 grams of carbohydrates with each meal.  2. Start taking the glipizide XL once daily. Times to take the additional glipizide 5 mg tablet- anytime you eat Chinese/Japanese food, also when you are having a large serving of dessert.  3. When you are drinking mixed drinks choose ones that do not have a lot of juices, sugar syrups or liquers in them. Remember that  an average mixed drink has 26 grams of carbohydrates in it.     Next appointment to see me is: Monday March 6th at 8 AM.    Truett Mainland. Donneta Romberg, PharmD, BCPS, CDE [facility] [place] Coordinator [phone]

## 2015-06-24 NOTE — Progress Notes (Signed)
I have reviewed this pharmacist's note and agree  

## 2015-07-13 MED FILL — CONTOUR NEXT METER: W/DEVICE | 30 days supply | Qty: 1 | Fill #1

## 2015-07-29 MED FILL — metFORMIN HCL 1000 MG TABS: 1000 | 90 days supply | Qty: 180 | Fill #1

## 2015-08-05 ENCOUNTER — Other Ambulatory Visit: Payer: Self-pay | Admitting: Internal Medicine

## 2015-08-05 MED FILL — JANUVIA 100 MG TABLET: 100 | 90 days supply | Qty: 90 | Fill #0

## 2015-08-09 MED FILL — SIMVASTATIN 20 MG TABLET: 20 | 90 days supply | Qty: 90 | Fill #1

## 2015-08-30 ENCOUNTER — Other Ambulatory Visit: Payer: Self-pay | Admitting: Internal Medicine

## 2015-08-30 ENCOUNTER — Other Ambulatory Visit: Payer: Self-pay | Admitting: *Deleted

## 2015-08-30 DIAGNOSIS — IMO0001 Reserved for inherently not codable concepts without codable children: Secondary | ICD-10-CM

## 2015-08-30 DIAGNOSIS — E1165 Type 2 diabetes mellitus with hyperglycemia: Principal | ICD-10-CM

## 2015-08-30 DIAGNOSIS — E119 Type 2 diabetes mellitus without complications: Secondary | ICD-10-CM

## 2015-08-30 MED ORDER — GLIPIZIDE 10 MG PO TABS: 5.0000 mg | ORAL_TABLET | Freq: Two times a day (BID) | ORAL | Status: DC

## 2015-08-30 MED FILL — glipiZIDE 10 MG TABS: 10 | 30 days supply | Qty: 30 | Fill #0

## 2015-08-30 MED FILL — ACYCLOVIR 400 MG TABLET: 400 | 13 days supply | Qty: 50 | Fill #1

## 2015-08-30 MED FILL — CONTOUR NEXT STRIPS: 90 days supply | Qty: 200 | Fill #0

## 2015-08-30 MED FILL — INVOKANA 100 MG TABLET: 100 | 60 days supply | Qty: 60 | Fill #0

## 2015-08-30 NOTE — Telephone Encounter (Signed)
Pt unable to tolerate the Glipizide ER, would like to go back on the IR. Ok per Dr Cruzita Lederer.

## 2015-08-30 NOTE — Telephone Encounter (Signed)
Opened encounter in error  

## 2015-09-13 ENCOUNTER — Encounter: Payer: Self-pay | Admitting: Internal Medicine

## 2015-09-13 ENCOUNTER — Ambulatory Visit (INDEPENDENT_AMBULATORY_CARE_PROVIDER_SITE_OTHER): Payer: 59 | Admitting: Internal Medicine

## 2015-09-13 ENCOUNTER — Other Ambulatory Visit: Payer: Self-pay

## 2015-09-13 ENCOUNTER — Ambulatory Visit (INDEPENDENT_AMBULATORY_CARE_PROVIDER_SITE_OTHER): Payer: Self-pay | Admitting: Family Medicine

## 2015-09-13 ENCOUNTER — Other Ambulatory Visit (INDEPENDENT_AMBULATORY_CARE_PROVIDER_SITE_OTHER): Payer: 59 | Admitting: *Deleted

## 2015-09-13 ENCOUNTER — Encounter: Payer: Self-pay | Admitting: Pharmacist

## 2015-09-13 VITALS — BP 112/80 | Wt 156.2 lb

## 2015-09-13 VITALS — BP 112/80 | HR 78 | Temp 98.1°F | Resp 12 | Wt 159.0 lb

## 2015-09-13 DIAGNOSIS — E1165 Type 2 diabetes mellitus with hyperglycemia: Secondary | ICD-10-CM | POA: Diagnosis not present

## 2015-09-13 DIAGNOSIS — E538 Deficiency of other specified B group vitamins: Secondary | ICD-10-CM

## 2015-09-13 DIAGNOSIS — IMO0001 Reserved for inherently not codable concepts without codable children: Secondary | ICD-10-CM

## 2015-09-13 DIAGNOSIS — Z1231 Encounter for screening mammogram for malignant neoplasm of breast: Secondary | ICD-10-CM

## 2015-09-13 DIAGNOSIS — E119 Type 2 diabetes mellitus without complications: Secondary | ICD-10-CM

## 2015-09-13 LAB — VITAMIN B12: VITAMIN B 12: 503 pg/mL (ref 211–911)

## 2015-09-13 LAB — POCT GLYCOSYLATED HEMOGLOBIN (HGB A1C): HEMOGLOBIN A1C: 6.4

## 2015-09-13 NOTE — Progress Notes (Signed)
Subjective:  Patient presents today for 3 month diabetes follow-up as part of the employer-sponsored Link to Wellness program.  Current diabetes regimen includes glipizide 5 mg BID, metformin 1000 mg BID, Invokana 100 mg daily and Januvia 100 mg daily.   Patient also continues on daily statin.  Most recent MD follow-up was with Dr. Cruzita Lederer in December. Patient has a pending appt for today with Dr. Cruzita Lederer.  No major health changes at this time.    Medication change- at her last visit with Dr. Cruzita Lederer she was changed to glipizide ER 5 mg daily. (Patient was previously taking glipizide IR 5 mg BID). She states that she took the ER tablet for about 2 weeks and she states that she felt like her CBGs were elevated. She stopped taking the 5 mg ER and went back to taking glipizide IR 5 mg BID. Patient states that she will let Dr. Cruzita Lederer know at her appointment this morning. I will also send an inbasket message to Dr. Cruzita Lederer to let her know.   Assessment:  Diabetes: Most recent A1C was  7.6 % which is exceeding goal of less than 7%. Weight is increased from last visit with me. She has a visit today with her endocrinologist who will check A1C.   Based on CBGs and weight gain I estimate that A1C is likely still above goal of less than 7%.    CBG Review: She states she is checking at least once daily, some days she is checking twice daily. Majority of CBG checks are fasting and then she is also checking at other times during the day.   CBG averages are about 10-20 points higher than at her last visit with me.   Low/high- 59/262 (she thinks she forgot to take her medication the night before) She states she has had a few low blood sugars, but none in the past few weeks.   Fasting CBGs are averaging in the 120s.   Lifestyle improvements:  Physical Activity-  She states she has been working 4 twelve hour shifts during the week and she states that on her days off she is catching up on laundry and other  household tasks. She has not been exercising at all in the past few months.   I encouraged patient to return to physical activity. Several months ago patient would spend up to an hour several days a week walking with her dogs. She agreed that she needed to restart and thinks that she can walk on her days off.    Nutrition-  Weight has gone up a few pounds since last visit. She reports that during the day when she is at work she is eating well because she states she is making healthy choices from the cafeteria. When she gets home she is not eating as well. Her husband is doing the majority of the grocery shopping.   Part of her problem she states too is that she is eating jelly belly jelly beans after dinner.   She states she has cut back on eating out fast food. She is eating out about three times a week (one of her goals from last visit).   Patient previously was following the weight watchers plan and was successful at losing over 40 pounds. I think that patient knows the amount and types of food she should be eating but just isn't doing it right now. I encouraged her to return to the eating habits she had while she was following weight watchers.   Follow  up with me in 3 months.    Plan/Goals for Next Visit:  1. Make healthier eating choices at home. Don't buy anymore jelly beans. If you can't do the grocery shopping, make a list of the healthy foods you want your husband to pick up for you.  2. On your days off do something active for at least 30 minutes. This could be walking, bike riding, etc.     Next appointment to see me is: Monday June 5th at [place] [facility]. Donneta Romberg, PharmD, BCPS, CDE [facility] to [place] Coordinator [phone]

## 2015-09-13 NOTE — Patient Instructions (Addendum)
Please continue: - Invokana 100 mg daily - Januvia 100 mcg daily  - Regular Glipizide 5 mg 2x a day  Move all Metformin with dinner (2000 mg).  Please stop at the lab.  Please come back for a follow-up appointment in 3 months.

## 2015-09-13 NOTE — Progress Notes (Signed)
Patient ID: [name] Young, female   DOB: 07/23/1970, 45 y.o.   MRN: [id number]  HPI: [name] Young is a 45 y.o.-year-old femalemale, returning for f/u for DM2, dx in 2008 (GDM 1998), non-insulin-dependent, uncontrolled, without complications. Last visit 3 mo ago.  DM2: Last hemoglobin A1c was: Lab Results  Component Value Date   HGBA1C 7.6 06/14/2015   HGBA1C 6.8* 03/22/2015   HGBA1C 6.5 03/12/2015   Since she was dx with DM >> she lost 50 lbs and was able to come off Lantus.  Pt is on a regimen of: - Metformin 1000 mg 2x a day - Invokana 100 mg - started 09/2014 - Januvia 100 mg daily - started 12/2014 - Regular Glipizide 5 mg 2x a day Stopped Glipizide XL 5 mg before b'fast >> stopped  Pt checks her sugars 2x a day and they are: - am: 109, 120s, 160 >> 95-137, 167 >> 96-129, 147 >> 74, 84-115 >> 116-212 >> 91, 102-165 (230 - forgot meds) - 2h after b'fast: n/c >> 120, 225 (poptarts) >> 176-268 >> 89 >> 305 >> n/c - before lunch: n/c >> 85-105 >> 65 (late lunch), 107-128 >> 83, 105 >> 141 >> 60, 92-113 - >2h after lunch: n/c >> 79-93, 194 >> 156-243 >> 174  >> 159-209 >> n/c - before dinner: n/c >> 150, 165 (not always fasting) >> 60-140 >> 68-99 >> 107-130, 247 >> 107-114, 176 - 2h after dinner: n/c >> 105-176, 213 >> 235-298 >> 88-136 >> 195 >> 153, 219 - bedtime:  N/c >> 144, 157 >> n/c  - nighttime: n/c >> 68, 180 >> n/c >> 58 (skipped dinner) >> n/c No lows. Lowest sugar was 68 >> 59 >> skipped a meal; she has hypoglycemia awareness at 60.  Highest sugar was 208 >> 225 >> 298 >> 174 >> 230.  Glucometer: Bayer  Pt's meals are: - Breakfast: eggs, [place] bacon; green smoothies - Lunch: soup, chicken salad - Dinner: zucchini + chicken + rice - Snacks: Smoothies, jelly belly  - no CKD, last BUN/creatinine:  Lab Results  Component Value Date   BUN 12 03/22/2015   CREATININE 0.86 03/22/2015  Was on Lisinopril >> stopped >> ACR normal after stopping: Microalb, Ur  10/27/2014 <0.7  0.0 - 1.9 mg/dL Final  Creatinine,U 10/27/2014 94.2   Final  Microalb Creat Ratio 10/27/2014 0.7  0.0 - 30.0 mg/g Final   - last set of lipids: Lab Results  Component Value Date   CHOL 160 03/22/2015   HDL 61.90 03/22/2015   LDLCALC 65 01/28/2013   LDLDIRECT 62.0 03/22/2015   TRIG 206.0* 03/22/2015   CHOLHDL 3 03/22/2015  On Zocor. - last eye exam was in 01/2014. No DR.  - + numbness and tingling in her foot  Vit B12 def. - normalized at last check, on 1000 mcg daily now + the amount in B-complex Feels better >> more energy, less hair loss  ROS: Constitutional: + weightgain, no fatigue, no subjective hyperthermia/hypothermia Eyes: no blurry vision, no xerophthalmia ENT: no sore throat, no nodules palpated in throat, no dysphagia/odynophagia, no hoarseness Cardiovascular: no CP/SOB/palpitations/leg swelling Respiratory: no cough/SOB Gastrointestinal: no N/V/D/C Musculoskeletal: no muscle/joint aches Skin: no rashes, + hair loss >> improved Neurological: no tremors/numbness/tingling/dizziness  I reviewed pt's medications, allergies, PMH, social hx, family hx, and changes were documented in the history of present illness. Otherwise, unchanged from my initial visit note.  Past Medical History  Diagnosis Date  . Diabetes mellitus  Type II   No past surgical history on file. History   Social History  . Marital Status: Married    Spouse Name: N/A  . 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 Smoking status: Never Smoker   . Smokeless tobacco: Not on file  . Alcohol Use: Socially; liquor  . Drug Use: No   Social History Narrative   Regular exercise - 2-3x a week   Current Outpatient Prescriptions on File Prior to Visit  Medication Sig Dispense Refill  . ACCU-CHEK FASTCLIX LANCETS MISC USE AS DIRECTED 102 each 5  . ACCU-CHEK SMARTVIEW test strip USE TO TEST AS DIRECTED 100 each 5  . acyclovir  (ZOVIRAX) 400 MG tablet Take 2 tablets (800 mg total) by mouth 2 (two) times daily. 50 tablet 3  . cholecalciferol (VITAMIN D) 1000 UNITS tablet Take 1,000 Units by mouth daily.    . fluticasone (FLONASE) 50 MCG/ACT nasal spray INSTILL 1 SPRAY IN EACH NOSTRIL AT BEDTIME 16 g 6  . glipiZIDE (GLUCOTROL) 10 MG tablet Take 0.5 tablets (5 mg total) by mouth 2 (two) times daily before a meal. 60 tablet 1  . INVOKANA 100 MG TABS tablet TAKE 1 TABLET BY MOUTH ONCE DAILY 30 tablet 1  . JANUVIA 100 MG tablet TAKE 1 TABLET BY MOUTH DAILY. 30 tablet 5  . metFORMIN (GLUCOPHAGE) 1000 MG tablet TAKE 1 TABLET BY MOUTH 2 TIMES DAILY WITH A MEAL. 200 tablet 4  . Multiple Vitamins-Minerals (DIASENSE MULTIVITAMIN) TABS Take 1 tablet by mouth daily.      . norethindrone-ethinyl estradiol 1/35 (NORTREL 1/35, 28,) tablet TAKE 1 TABLET BY MOUTH DAILY 84 tablet 4  . simvastatin (ZOCOR) 20 MG tablet TAKE 1 TABLET BY MOUTH AT BEDTIME. 100 tablet 4  . vitamin B-12 (CYANOCOBALAMIN) 1000 MCG tablet Take 1,000 mcg by mouth daily.      No current facility-administered medications on file prior to visit.   Allergies  Allergen Reactions  . Penicillins     REACTION: RASH   Family History  Problem Relation Age of Onset  . Diabetes Other     Familly Hx First degree relative   PE: BP 112/80 mmHg  Pulse 78  Temp(Src) 98.1 F (36.7 C) (Oral)  Resp 12  Wt 159 lb (72.122 kg)  SpO2 98% Body mass index is 24.9 kg/(m^2). Wt Readings from Last 3 Encounters:  09/13/15 159 lb (72.122 kg)  09/13/15 156 lb 3.2 oz (70.852 kg)  06/14/15 151 lb 9.6 oz (68.765 kg)   Constitutional: normal weight, in NAD Eyes: PERRLA, EOMI, no exophthalmos ENT: moist mucous membranes, no thyromegaly, no cervical lymphadenopathy Cardiovascular: RRR, No MRG Respiratory: CTA B Gastrointestinal: abdomen soft, NT, ND, BS+ Musculoskeletal: no deformities, strength intact in all 4 Skin: moist, warm, no rashes Neurological: no tremor with  outstretched hands, DTR normal in all 4  ASSESSMENT: 1. DM2, non-insulin-dependent, uncontrolled, without complications  2. B12 deficiency  PLAN:  1. Patient with long-standing, fairly well controlled diabetes, on oral antidiabetic regimen - now with better sugars after starting Glipizide. Sugars still high in am. - I suggested to move all Metformin at night. If she cannot tolerate this >> advised her to let me know so we can change to Metformin ER:  Patient Instructions  Please continue: - Metformin 1000 mg 2x a day - Invokana 100 mg daily - Januvia 100 mcg daily  - Glipizide XL 5 mg before b'fast. -  Regular Glipizide 5 mg before a large meal.  Please come back for a follow-up appointment in 3 months.  - continue checking sugars at different times of the day - check 1x times a day, rotating checks - advised for yearly eye exams >> she needs one - will check a HbA1c today >> 6.4% (better) - Return to clinic in 3 mo with sugar log   2. B12 deficiency - started 2000 mcg >> decreased to 1000 mcg daily >> rechecked level was normal at last visit >> recheck today  Orders Only on 09/13/2015  Component Date Value Ref Range Status  . Hemoglobin A1C 09/13/2015 6.4   Final  Office Visit on 09/13/2015  Component Date Value Ref Range Status  . Vitamin B-12 09/13/2015 503  211 - 911 pg/mL Final   The vitamin B12 level is still good >> continue the current  B12  Supplement dose.

## 2015-09-24 MED FILL — DASETTA 1-35-28 TABLET: 1-35 | 84 days supply | Qty: 84 | Fill #2

## 2015-09-27 MED FILL — glipiZIDE 10 MG TABS: 10 | 30 days supply | Qty: 30 | Fill #1

## 2015-10-12 ENCOUNTER — Ambulatory Visit
Admission: RE | Admit: 2015-10-12 | Discharge: 2015-10-12 | Disposition: A | Discharge status: Home / Self Care | Payer: 59 | Source: Ambulatory Visit

## 2015-10-12 DIAGNOSIS — Z1231 Encounter for screening mammogram for malignant neoplasm of breast: Secondary | ICD-10-CM | POA: Diagnosis not present

## 2015-10-21 NOTE — Progress Notes (Signed)
I have reviewed this pharmacist's note and agree  

## 2015-10-26 MED FILL — metFORMIN HCL 1000 MG TABS: 1000 | 90 days supply | Qty: 180 | Fill #2

## 2015-11-02 ENCOUNTER — Other Ambulatory Visit: Payer: Self-pay | Admitting: Internal Medicine

## 2015-11-02 MED FILL — glipiZIDE 10 MG TABS: 10 | 30 days supply | Qty: 30 | Fill #2

## 2015-11-02 MED FILL — INVOKANA 100 MG TABLET: 100 | 90 days supply | Qty: 90 | Fill #0

## 2015-11-02 MED FILL — JANUVIA 100 MG TABLET: 100 | 90 days supply | Qty: 90 | Fill #1

## 2015-11-15 MED FILL — SIMVASTATIN 20 MG TABLET: 20 | 90 days supply | Qty: 90 | Fill #2

## 2015-11-26 MED FILL — glipiZIDE 10 MG TABS: 10 | 30 days supply | Qty: 30 | Fill #3

## 2015-11-26 MED FILL — ACYCLOVIR 400 MG TABLET: 400 | 13 days supply | Qty: 50 | Fill #2

## 2015-12-13 ENCOUNTER — Encounter: Payer: Self-pay | Admitting: Internal Medicine

## 2015-12-13 ENCOUNTER — Ambulatory Visit (INDEPENDENT_AMBULATORY_CARE_PROVIDER_SITE_OTHER): Payer: 59 | Admitting: Internal Medicine

## 2015-12-13 ENCOUNTER — Other Ambulatory Visit (INDEPENDENT_AMBULATORY_CARE_PROVIDER_SITE_OTHER): Payer: 59 | Admitting: *Deleted

## 2015-12-13 ENCOUNTER — Encounter: Payer: Self-pay | Admitting: Pharmacist

## 2015-12-13 ENCOUNTER — Other Ambulatory Visit: Payer: Self-pay | Admitting: Pharmacist

## 2015-12-13 VITALS — BP 102/68 | HR 80 | Temp 98.0°F | Resp 12 | Wt 156.0 lb

## 2015-12-13 VITALS — BP 98/72 | Wt 155.2 lb

## 2015-12-13 DIAGNOSIS — IMO0001 Reserved for inherently not codable concepts without codable children: Secondary | ICD-10-CM

## 2015-12-13 DIAGNOSIS — E1165 Type 2 diabetes mellitus with hyperglycemia: Secondary | ICD-10-CM | POA: Diagnosis not present

## 2015-12-13 DIAGNOSIS — E538 Deficiency of other specified B group vitamins: Secondary | ICD-10-CM

## 2015-12-13 DIAGNOSIS — E119 Type 2 diabetes mellitus without complications: Secondary | ICD-10-CM | POA: Diagnosis not present

## 2015-12-13 LAB — POCT GLYCOSYLATED HEMOGLOBIN (HGB A1C): Hemoglobin A1C: 6.5

## 2015-12-13 MED ORDER — METFORMIN HCL 1000 MG PO TABS: ORAL_TABLET | ORAL | Status: DC

## 2015-12-13 MED FILL — DASETTA 1-35-28 TABLET: 1-35 | 84 days supply | Qty: 84 | Fill #3

## 2015-12-13 NOTE — Patient Instructions (Addendum)
Patient Instructions  Please continue: - Metformin 2000 mg with dinner - Invokana 100 mg daily - Januvia 100 mcg daily  - Regular Glipizide 2.5 mg 2x a day  Please come back for a follow-up appointment in 3 months.

## 2015-12-13 NOTE — Patient Outreach (Signed)
Subjective:  Patient presents today for 3 month diabetes follow-up as part of the employer-sponsored Link to Wellness program.  Current diabetes regimen includes glipizide 5 mg BID with meals, metformin 2000 mg at bedtime, Invokana 100 mg daily.  Patient also continues on daily statin.  Most recent MD follow-up was in March with Dr. Cruzita Lederer. Patient has a pending appt for today with Dr. Cruzita Lederer. No med changes or major health changes at this time.   Assessment:  Diabetes: Most recent A1C was 6.4  % which is at goal of less than 7%. Weight is decreased from last visit with me.    CBG Review: Patient is checking once daily in the morning, fasting. Majority of fasting readings are in the 110s.   High/Low- 186/68. Denies hypoglycemia.   Lifestyle improvements:  Physical Activity-  She is kayaking once a week for 30-45 minutes. She is walking once a week for 30 minutes. She is still working 4 days a week (12 hour shifts).    Nutrition-  No major changes. She reports that she is eating salads daily. She does report that nighttime eating is not as good. She has cut back on eating out for dinner after work though which is good.   Follow up with me in 3 months.    Plan/Goals for Next Visit:  1. Call and make an appointment to get your eyes checked.  2. Continue to engage in physical activity twice a week. Once your [facility] DVDs come, start using them.     Next appointment to see me is: Friday August 18th at 8 AM.    [place]. Donneta Romberg, PharmD, BCPS, CDE [facility] [place] Coordinator [phone]

## 2015-12-13 NOTE — Progress Notes (Signed)
Patient ID: [name] Young, female   DOB: Feb 08, 1971, 45 y.o.   MRN: [id number]  HPI: [name] Young is a 45 y.o.-year-old female, returning for f/u for DM2, dx in 2008 (GDM 1998), non-insulin-dependent, uncontrolled, without complications. Last visit 3 mo ago.  DM2: Last hemoglobin A1c was: Lab Results  Component Value Date   HGBA1C 6.4 09/13/2015   HGBA1C 7.6 06/14/2015   HGBA1C 6.8* 03/22/2015  Since she was dx with DM >> she lost 50 lbs and was able to come off Lantus.  Pt is on a regimen of: - Metformin 1000 mg 2x a day - Invokana 100 mg - started 09/2014 - Januvia 100 mg daily - started 12/2014 - Regular Glipizide 2.5 mg 2x a day  Pt checks her sugars 2x a day and they are: - am: 95-137, 167 >> 96-129, 147 >> 74, 84-115 >> 116-212 >> 91, 102-165 (230 - forgot meds) >> 90-135, 145 - 2h after b'fast: n/c >> 120, 225 (poptarts) >> 176-268 >> 89 >> 305 >> n/c >> 185 - before lunch: n/c >> 85-105 >> 65 (late lunch), 107-128 >> 83, 105 >> 141 >> 60, 92-113 >> 120 - >2h after lunch: n/c >> 79-93, 194 >> 156-243 >> 174  >> 159-209 >> n/c >> 158 - before dinner: n/c >> 150, 165 (not always fasting) >> 60-140 >> 68-99 >> 107-130, 247 >> 107-114, 176 >> n/c - 2h after dinner: n/c >> 105-176, 213 >> 235-298 >> 88-136 >> 195 >> 153, 219 >> n/c - bedtime:  N/c >> 144, 157 >> n/c  - nighttime: n/c >> 68, 180 >> n/c >> 58 (skipped dinner) >> n/c No lows. Lowest sugar was 68 >> 59 - skipped a meal >> 90s; she has hypoglycemia awareness at 60.  Highest sugar was 208 >> 225 >> 298 >> 174 >> 230 >> 185  Glucometer: Bayer  Pt's meals are: - Breakfast: eggs, [place] bacon; green smoothies - Lunch: soup, chicken salad - Dinner: zucchini + chicken + rice - Snacks: Smoothies, jelly belly  - no CKD, last BUN/creatinine:  Lab Results  Component Value Date   BUN 12 03/22/2015   CREATININE 0.86 03/22/2015  Was on Lisinopril >> stopped >> ACR normal after stopping: Microalb, Ur 10/27/2014 <0.7   0.0 - 1.9 mg/dL Final  Creatinine,U 10/27/2014 94.2   Final  Microalb Creat Ratio 10/27/2014 0.7  0.0 - 30.0 mg/g Final   - last set of lipids: Lab Results  Component Value Date   CHOL 160 03/22/2015   HDL 61.90 03/22/2015   LDLCALC 65 01/28/2013   LDLDIRECT 62.0 03/22/2015   TRIG 206.0* 03/22/2015   CHOLHDL 3 03/22/2015  On Zocor. - last eye exam was in 01/2014. No DR.  - + numbness and tingling in her foot  Vit B12 def. - normalized at last check, on 1000 mcg daily now + the amount in B-complex Feels better >> more energy, less hair loss  ROS: Constitutional: no weight gain, no fatigue, no subjective hyperthermia/hypothermia Eyes: no blurry vision, no xerophthalmia ENT: no sore throat, no nodules palpated in throat, no dysphagia/odynophagia, no hoarseness Cardiovascular: no CP/SOB/palpitations/leg swelling Respiratory: no cough/SOB Gastrointestinal: no N/V/D/C Musculoskeletal: no muscle/joint aches Skin: no rashes, + hair loss >> improved Neurological: no tremors/numbness/tingling/dizziness  I reviewed pt's medications, allergies, PMH, social hx, family hx, and changes were documented in the history of present illness. Otherwise, unchanged from my initial visit note.  Past Medical History  Diagnosis Date  . Diabetes mellitus  Type II   No past surgical history on file. History   Social History  . Marital Status: Married    Spouse Name: N/A  . 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 Smoking status: Never Smoker   . Smokeless tobacco: Not on file  . Alcohol Use: Socially; liquor  . Drug Use: No   Social History Narrative   Regular exercise - 2-3x a week   Current Outpatient Prescriptions on File Prior to Visit  Medication Sig Dispense Refill  . ACCU-CHEK FASTCLIX LANCETS MISC USE AS DIRECTED 102 each 5  . ACCU-CHEK SMARTVIEW test strip USE TO TEST AS DIRECTED 100 each 5  . acyclovir (ZOVIRAX) 400 MG  tablet Take 2 tablets (800 mg total) by mouth 2 (two) times daily. 50 tablet 3  . B Complex-C (SUPER B COMPLEX PO) Take 1 tablet by mouth daily.    . cholecalciferol (VITAMIN D) 1000 UNITS tablet Take 1,000 Units by mouth daily.    . fluticasone (FLONASE) 50 MCG/ACT nasal spray INSTILL 1 SPRAY IN EACH NOSTRIL AT BEDTIME 16 g 6  . glipiZIDE (GLUCOTROL) 10 MG tablet Take 0.5 tablets (5 mg total) by mouth 2 (two) times daily before a meal. 60 tablet 1  . INVOKANA 100 MG TABS tablet TAKE 1 TABLET BY MOUTH ONCE DAILY 30 tablet 5  . JANUVIA 100 MG tablet TAKE 1 TABLET BY MOUTH DAILY. 30 tablet 5  . metFORMIN (GLUCOPHAGE) 1000 MG tablet TAKE 1 TABLET BY MOUTH 2 TIMES DAILY WITH A MEAL. (Patient taking differently: Take 2,000 mg by mouth every evening. TAKE 1 TABLET BY MOUTH 2 TIMES DAILY WITH A MEAL.) 200 tablet 4  . Multiple Vitamins-Minerals (DIASENSE MULTIVITAMIN) TABS Take 1 tablet by mouth daily.      . norethindrone-ethinyl estradiol 1/35 (NORTREL 1/35, 28,) tablet TAKE 1 TABLET BY MOUTH DAILY 84 tablet 4  . simvastatin (ZOCOR) 20 MG tablet TAKE 1 TABLET BY MOUTH AT BEDTIME. 100 tablet 4  . vitamin B-12 (CYANOCOBALAMIN) 1000 MCG tablet Take 1,000 mcg by mouth daily.     . vitamin C (ASCORBIC ACID) 500 MG tablet Take 500 mg by mouth daily.     No current facility-administered medications on file prior to visit.   Allergies  Allergen Reactions  . Penicillins     REACTION: RASH   Family History  Problem Relation Age of Onset  . Diabetes Other     Familly Hx First degree relative   PE: BP 102/68 mmHg  Pulse 80  Temp(Src) 98 F (36.7 C) (Oral)  Resp 12  Wt 156 lb (70.761 kg)  SpO2 98% Body mass index is 24.43 kg/(m^2). Wt Readings from Last 3 Encounters:  12/13/15 156 lb (70.761 kg)  12/13/15 155 lb 3.2 oz (70.398 kg)  09/13/15 159 lb (72.122 kg)   Constitutional: normal weight, in NAD Eyes: PERRLA, EOMI, no exophthalmos ENT: moist mucous membranes, no thyromegaly, no cervical  lymphadenopathy Cardiovascular: RRR, No MRG Respiratory: CTA B Gastrointestinal: abdomen soft, NT, ND, BS+ Musculoskeletal: no deformities, strength intact in all 4 Skin: moist, warm, no rashes Neurological: no tremor with outstretched hands, DTR normal in all 4  ASSESSMENT: 1. DM2, non-insulin-dependent, uncontrolled, without complications  2. B12 deficiency  PLAN:  1. Patient with long-standing, fairly well controlled diabetes, on oral antidiabetic regimen - now with better sugars after starting Glipizide and moving all Metformin at night. - I suggested to:  Patient Instructions  Please continue: - Metformin 2000 mg at dinner  - Invokana 100 mg daily - Januvia 100 mcg daily  - Regular Glipizide 2.5 mg 2x a day  Please come back for a follow-up appointment in 3 months.  - continue checking sugars at different times of the day - check 1x times a day, rotating checks - advised for yearly eye exams >> she needs one - will check a HbA1c today >> 6.5%  - Return to clinic in 3 mo with sugar log   2. B12 deficiency - started 2000 mcg >> decreased to 1000 mcg daily >> rechecked level was normal at last visit

## 2015-12-24 DIAGNOSIS — H524 Presbyopia: Secondary | ICD-10-CM | POA: Diagnosis not present

## 2016-01-03 ENCOUNTER — Other Ambulatory Visit: Payer: Self-pay

## 2016-01-03 ENCOUNTER — Telehealth: Payer: Self-pay | Admitting: Family Medicine

## 2016-01-03 DIAGNOSIS — E1165 Type 2 diabetes mellitus with hyperglycemia: Principal | ICD-10-CM

## 2016-01-03 DIAGNOSIS — IMO0001 Reserved for inherently not codable concepts without codable children: Secondary | ICD-10-CM

## 2016-01-03 DIAGNOSIS — E119 Type 2 diabetes mellitus without complications: Secondary | ICD-10-CM

## 2016-01-03 MED ORDER — GLIPIZIDE 10 MG PO TABS: 5.0000 mg | ORAL_TABLET | Freq: Two times a day (BID) | ORAL | Status: DC

## 2016-01-03 MED FILL — glipiZIDE 10 MG TABS: 10 | 90 days supply | Qty: 90 | Fill #0

## 2016-01-03 NOTE — Telephone Encounter (Signed)
Medication refill ordered

## 2016-01-03 NOTE — Telephone Encounter (Signed)
Patient would like a refill on her Glitizide medication refilled.

## 2016-01-26 MED FILL — metFORMIN HCL 1000 MG TABS: 1000 | 90 days supply | Qty: 180 | Fill #3

## 2016-01-31 ENCOUNTER — Other Ambulatory Visit: Payer: Self-pay | Admitting: Internal Medicine

## 2016-01-31 MED FILL — INVOKANA 100 MG TABLET: 100 | 90 days supply | Qty: 90 | Fill #1

## 2016-01-31 MED FILL — JANUVIA 100 MG TABLET: 100 | 90 days supply | Qty: 90 | Fill #0

## 2016-02-04 ENCOUNTER — Ambulatory Visit (INDEPENDENT_AMBULATORY_CARE_PROVIDER_SITE_OTHER): Payer: 59 | Admitting: Medical

## 2016-02-04 ENCOUNTER — Telehealth: Payer: Self-pay | Admitting: Medical

## 2016-02-04 ENCOUNTER — Encounter: Payer: Self-pay | Admitting: Medical

## 2016-02-04 VITALS — BP 110/78 | HR 98 | Temp 98.1°F | Wt 157.0 lb

## 2016-02-04 DIAGNOSIS — N63 Unspecified lump in unspecified breast: Secondary | ICD-10-CM

## 2016-02-04 DIAGNOSIS — N6315 Unspecified lump in the right breast, overlapping quadrants: Secondary | ICD-10-CM

## 2016-02-04 DIAGNOSIS — N6310 Unspecified lump in the right breast, unspecified quadrant: Principal | ICD-10-CM

## 2016-02-04 NOTE — Progress Notes (Signed)
Pre visit review using our clinic review tool, if applicable. No additional management support is needed unless otherwise documented below in the visit note. 

## 2016-02-04 NOTE — Progress Notes (Signed)
Subjective:    Patient ID: [name] Young, female    DOB: January 14, 1971, 45 y.o.   MRN: [id number]  HPI  Pt in for some rt breast irritation. Pt states pain on rt breast. Rt of her nipple about 1.5 inch. Pt mom had breast CA and passed away at 81 yo and maternal aunt had mastectomy just recently. [name] Young is 45 yo. Symptoms present for this week. Pt has menses this week.  Pt last mammogram was this year. Was normal report.  NO discharge from nipple. NO dimpling. No axillary bumps.   Pt has felt lump in area of concern.    Review of Systems  Constitutional: Negative for chills, fatigue and fever.  HENT: Negative for congestion.   Respiratory: Negative for cough, choking, shortness of breath and wheezing.   Cardiovascular: Negative for chest pain and palpitations.  Skin: Negative for rash.  Neurological: Negative for dizziness and headaches.  Hematological: Negative for adenopathy. Does not bruise/bleed easily.    Past Medical History:  Diagnosis Date  . Diabetes mellitus    Type II     Social History   Social History  . Marital status: Married    Spouse name: N/A  . Number of children: N/A  . Years of education: N/A   Occupational History  . Not on file.   Social History Main Topics  . Smoking status: Never Smoker  . Smokeless tobacco: Not on file  . Alcohol use No  . Drug use: No  . Sexual activity: Not on file   Other Topics Concern  . Not on file   Social History Narrative   Regular exercise-No    No past surgical history on file.  Family History  Problem Relation Age of Onset  . Diabetes Other     Familly Hx First degree relative    Allergies  Allergen Reactions  . Penicillins     REACTION: RASH    Current Outpatient Prescriptions on File Prior to Visit  Medication Sig Dispense Refill  . ACCU-CHEK FASTCLIX LANCETS MISC USE AS DIRECTED 102 each 5  . ACCU-CHEK SMARTVIEW test strip USE TO TEST AS DIRECTED 100 each 5  . acyclovir (ZOVIRAX) 400 MG  tablet Take 2 tablets (800 mg total) by mouth 2 (two) times daily. 50 tablet 3  . B Complex-C (SUPER B COMPLEX PO) Take 1 tablet by mouth daily.    . cholecalciferol (VITAMIN D) 1000 UNITS tablet Take 1,000 Units by mouth daily.    . fluticasone (FLONASE) 50 MCG/ACT nasal spray INSTILL 1 SPRAY IN EACH NOSTRIL AT BEDTIME 16 g 6  . glipiZIDE (GLUCOTROL) 10 MG tablet Take 0.5 tablets (5 mg total) by mouth 2 (two) times daily before a meal. 60 tablet 1  . INVOKANA 100 MG TABS tablet TAKE 1 TABLET BY MOUTH ONCE DAILY 30 tablet 5  . JANUVIA 100 MG tablet TAKE 1 TABLET BY MOUTH DAILY. 30 tablet 5  . metFORMIN (GLUCOPHAGE) 1000 MG tablet Take 2 tablets by mouth with dinner 200 tablet 4  . Multiple Vitamins-Minerals (DIASENSE MULTIVITAMIN) TABS Take 1 tablet by mouth daily.      . norethindrone-ethinyl estradiol 1/35 (NORTREL 1/35, 28,) tablet TAKE 1 TABLET BY MOUTH DAILY 84 tablet 4  . simvastatin (ZOCOR) 20 MG tablet TAKE 1 TABLET BY MOUTH AT BEDTIME. 100 tablet 4  . vitamin B-12 (CYANOCOBALAMIN) 1000 MCG tablet Take 1,000 mcg by mouth daily.     . vitamin C (ASCORBIC ACID) 500 MG tablet Take 500  mg by mouth daily.     No current facility-administered medications on file prior to visit.     BP 110/78 (BP Location: Left Arm, Patient Position: Sitting, Cuff Size: Normal)   Pulse 98   Temp 98.1 F (36.7 C) (Oral)   Wt 157 lb (71.2 kg)   LMP 01/28/2016   SpO2 98%   BMI 24.59 kg/m       Objective:   Physical Exam  General- No acute distress. Pleasant patient. Neck- Full range of motion, no jvd Lungs- Clear, even and unlabored. Heart- regular rate and rhythm. Neurologic- CNII- XII grossly intact.  Breast- symmetric. No redness, no warmth. No dimpling of nipples. No dc. Axillary areas free from lymphadenopathy. Lt breast no mass. Rt breast about 1.5 cm lateral to nipple faint almost oval shaped lump. Feels smooth.            Assessment & Plan:  For your breast lump and family  history, I will refer you for breast ultrasound. Will try to schedule you for 2 weeks from today.   For next 2 weeks avoid caffeinated beverages.   When you are back from vacation you could call our office and ask to speak with Anderson Malta if no one has called you on appointment date by then.  Follow up in 3 weeks or as needed

## 2016-02-04 NOTE — Patient Instructions (Addendum)
For your breast lump and family history, I will refer you for breast ultrasound. Will try to schedule you for 2 weeks from today.   For next 2 weeks avoid caffeinated beverages.   When you are back from vacation you could call our office and ask to speak with Anderson Malta if no one has called you on appointment date by then.  Follow up in 3 weeks or as needed

## 2016-02-04 NOTE — Telephone Encounter (Signed)
Will you please call over to breast center. Want to order diagnostic ultrasound on pt rt breast. Small lump. Mom and aunt oboth had breast CA. Mom deceased from breast CA at 47 yo. Will you ask them what is protocol on how to order. Then I will sign. Thanks.

## 2016-02-07 ENCOUNTER — Telehealth: Payer: Self-pay | Admitting: Medical

## 2016-02-07 DIAGNOSIS — N63 Unspecified lump in unspecified breast: Secondary | ICD-10-CM

## 2016-02-07 NOTE — Addendum Note (Signed)
Addended by: Tasia Catchings on: 02/07/2016 08:18 AM   Modules accepted: Orders

## 2016-02-07 NOTE — Telephone Encounter (Signed)
Order placed for Ultrasound at [place].

## 2016-02-07 NOTE — Telephone Encounter (Signed)
Below is the not I got. Will you put those orderes in and I will sign the orders. Thanks.   Patients that are experiencing problems are to have a diagnostic mammogram & u/s of the breast(s).  The [facility] performs all diagnostic mammograms with TOMO (3D), and all breast u/s as LTD.  Please update the orders in Epic to reflect the following:   [id number] Rt Breast Diag Tomo Mammogram   [id number] Rt Breast LTD U/S   Once the orders have been updated, the patient will be contacted.  Please call with any questions.   Thank you.

## 2016-02-07 NOTE — Telephone Encounter (Signed)
The ultrasound order has been placed. I spoke with Marita Kansas and she states that the patients insurance will not pay for a repeat mammogram since pt had one earlier this year.

## 2016-02-08 NOTE — Telephone Encounter (Signed)
Orders updated

## 2016-02-08 NOTE — Addendum Note (Signed)
Addended by: Debbrah Alar on: 02/08/2016 08:18 AM   Modules accepted: Orders

## 2016-02-14 ENCOUNTER — Telehealth: Payer: Self-pay | Admitting: Family Medicine

## 2016-02-14 ENCOUNTER — Telehealth: Payer: Self-pay

## 2016-02-14 NOTE — Telephone Encounter (Signed)
Needs referral for both breast    [phone] unit  5 left page Kumari

## 2016-02-14 NOTE — Telephone Encounter (Signed)
This is the patient that you explained to me radiologist protocol regarding getting mammogram first then [place] following the mammogram. Pt sent message requesting [place]. Someone sent me skype message stating some concern that mammgram might not be covered since she had one done recently. Will you touch base with radiology and clarify. They can be very strict on following there protocols. Let me know what they say. And make them aware of when pt last mammogarm was.

## 2016-02-14 NOTE — Telephone Encounter (Signed)
°  Relation to [id number] Call back number: [phone] work #    Reason for call:  Patient requesting orders for ultrasound of left breast due to experiencing pain. Please advise

## 2016-02-15 NOTE — Telephone Encounter (Signed)
Spoke with pt and she states that she will have to call back later this morning. I gave her the office number.

## 2016-02-15 NOTE — Telephone Encounter (Signed)
I spoke with Drue Dun in Referrals and the patient will need to contact her her insurance with the code G0206 for the diagnostic breast exam and [phone] for the [place] to make sure that both the procedures are covered since the pt had a preventative Mammogram on 10/12/15.

## 2016-02-18 ENCOUNTER — Ambulatory Visit
Admission: RE | Admit: 2016-02-18 | Discharge: 2016-02-18 | Disposition: A | Discharge status: Home / Self Care | Payer: 59 | Source: Ambulatory Visit | Attending: Family | Admitting: Family

## 2016-02-18 ENCOUNTER — Other Ambulatory Visit: Payer: 59

## 2016-02-18 DIAGNOSIS — N63 Unspecified lump in unspecified breast: Secondary | ICD-10-CM

## 2016-02-18 MED FILL — FLUTICASONE PROP 50 MCG SPR: 50 | 60 days supply | Qty: 16 | Fill #1

## 2016-02-18 MED FILL — SIMVASTATIN 20 MG TABLET: 20 | 90 days supply | Qty: 90 | Fill #3

## 2016-02-21 NOTE — Telephone Encounter (Signed)
I have attempted to reach the pt 3 times unsuccessfully about the pt's insurance not covering the mammogram.  I have left another message for the pt to call back about the recent results.

## 2016-02-25 ENCOUNTER — Ambulatory Visit: Payer: Self-pay | Admitting: Pharmacist

## 2016-03-03 ENCOUNTER — Ambulatory Visit: Payer: Self-pay | Admitting: Pharmacist

## 2016-03-09 ENCOUNTER — Other Ambulatory Visit: Payer: Self-pay | Admitting: Pharmacist

## 2016-03-09 ENCOUNTER — Encounter: Payer: Self-pay | Admitting: Pharmacist

## 2016-03-09 VITALS — BP 96/74 | Wt 152.0 lb

## 2016-03-09 DIAGNOSIS — E119 Type 2 diabetes mellitus without complications: Secondary | ICD-10-CM

## 2016-03-09 MED FILL — DASETTA 1-35-28 TABLET: 1-35 | 84 days supply | Qty: 84 | Fill #4

## 2016-03-09 NOTE — Patient Outreach (Signed)
[facility]) Care Management  03/09/2016  [name] Young October 31, 1970 [id number]   Subjective:  Patient presents today for 3 month diabetes follow-up as part of the employer-sponsored Link to Wellness program. Current diabetes regimen includes glipizide 5 mg BID with meals, metformin 2000 mg at bedtime, Invokana 100 mg daily, and Januvia 100 mg daily.  Patient also continues on daily statin. Most recent MD follow-up was in June with Dr. Cruzita Lederer. Patient has a pending appt for Sept 2017 with Dr. Cruzita Lederer. No med changes or major health changes at this time.  Assessment:  Diabetes: Most recent A1C was 6.5% (prev 6.4%) which is at goal of less than 7%. Weight is decreased by 2 lb since last visit.   CBG Review: Patient is checking once daily in the morning, fasting. Majority of fasting readings are less than 120; however, patient has had several readings 150-160s recently due to eating late and due to [place] in [place].  Continues using Bayer EZ meter.  Hypoglycemia is occassional and occurs 1-2 times per month, pt is able to recognize symptoms and corrects with 3 graham cracker squares.   Patient describes mild symptoms that may be indicative of neuropathy, some numbness in last toe of both feet, occassional sharp pains in bottom of feet but mostly  after working a 12 hour shift.    Eye exam - up to date - one spot on retina, but resolved on its own after one month Dental exam up to date   Lifestyle improvements:  Physical Activity-  No routine exercise at this time.  Walks on the job, wears fitbit, usually walks 8,000-9,000 steps per day.  Has a 3 mi and 5 mi loop in neighborhood, but has not been walking it.  Also has a PiYo video but only did this 3 times.  She will attempt to walk 1 mile 1 day per week OR do PiYo once weekly.   Nutrition-  Feels diet is under good control.  Lifetime member of weight watchers, does not count points or attend meetings,  but still applies the principles of the program to her diet.   Breakfast - egg white omelet with vegetables, [place] bacon or sausage with cheese. Lunch - salad - made from home - spring mix - cucumber, carrot, olives, cranberries, candied pecans, tomatoes. Avocado cilantro dressing.   Supper - sometimes eats out (50%) and cooks in (50%) - [place] last night.  Wendys - burger and fries, fried shrimp.   Snacks - graham crackers, smart pop popcorn, cashews Eats 8-9 pm supper Sweets - hershey with almonds bar (once per week), jelly bellys (limiting this) Beverages - Diet Dr. Malachi Bonds, water, coffee (1-3 cups per week), mixed drink once per week   Follow up with [name] in 3 months.    Plan/Goals for Next Visit:  1) Resume physical activity once weekly, goal to do either PiYo video OR walk 1+ miles once weekly 2)  Limit fried foods (french fries and fried shrimp) 3)  Limit sweets (try fun size hershey bar vs full size) 4)  Attempt to increase water and reduce diet soda 5)  Follow-up with [name] after her maternity leave.   Next appointment Monday January 8th @ 9:00 am   Great to see you today!  [name], PharmD [facility]  [phone]

## 2016-03-16 ENCOUNTER — Encounter: Payer: Self-pay | Admitting: Internal Medicine

## 2016-03-16 ENCOUNTER — Ambulatory Visit (INDEPENDENT_AMBULATORY_CARE_PROVIDER_SITE_OTHER): Payer: 59 | Admitting: Internal Medicine

## 2016-03-16 VITALS — BP 120/74 | HR 84 | Ht 67.0 in | Wt 157.0 lb

## 2016-03-16 DIAGNOSIS — E538 Deficiency of other specified B group vitamins: Secondary | ICD-10-CM | POA: Diagnosis not present

## 2016-03-16 DIAGNOSIS — IMO0001 Reserved for inherently not codable concepts without codable children: Secondary | ICD-10-CM

## 2016-03-16 DIAGNOSIS — E1165 Type 2 diabetes mellitus with hyperglycemia: Secondary | ICD-10-CM | POA: Diagnosis not present

## 2016-03-16 LAB — LIPID PANEL
CHOL/HDL RATIO: 2
Cholesterol: 150 mg/dL (ref 0–200)
HDL: 61.7 mg/dL (ref >39.00)
LDL Cholesterol: 63 mg/dL (ref 0–99)
NONHDL: 88.6
TRIGLYCERIDES: 130 mg/dL (ref 0.0–149.0)
VLDL: 26 mg/dL (ref 0.0–40.0)

## 2016-03-16 LAB — COMPLETE METABOLIC PANEL WITH GFR
ALT: 25 U/L (ref 6–29)
AST: 21 U/L (ref 10–35)
Albumin: 4.4 g/dL (ref 3.6–5.1)
Alkaline Phosphatase: 52 U/L (ref 33–115)
BUN: 19 mg/dL (ref 7–25)
CO2: 25 mmol/L (ref 20–31)
Calcium: 9.9 mg/dL (ref 8.6–10.2)
Chloride: 101 mmol/L (ref 98–110)
Creat: 0.73 mg/dL (ref 0.50–1.10)
GFR, Est African American: >89 mL/min (ref >=60)
GLUCOSE: 90 mg/dL (ref 65–99)
POTASSIUM: 4.1 mmol/L (ref 3.5–5.3)
SODIUM: 140 mmol/L (ref 135–146)
Total Bilirubin: 0.5 mg/dL (ref 0.2–1.2)
Total Protein: 7.2 g/dL (ref 6.1–8.1)

## 2016-03-16 LAB — POCT GLYCOSYLATED HEMOGLOBIN (HGB A1C): HEMOGLOBIN A1C: 6.5

## 2016-03-16 LAB — MICROALBUMIN / CREATININE URINE RATIO
CREATININE, U: 96.3 mg/dL
MICROALB/CREAT RATIO: 0.7 mg/g (ref 0.0–30.0)

## 2016-03-16 LAB — HEMOGLOBIN A1C: HEMOGLOBIN A1C: 6.6 % — AB (ref 4.6–6.5)

## 2016-03-16 LAB — VITAMIN B12: Vitamin B-12: 969 pg/mL — ABNORMAL HIGH (ref 211–911)

## 2016-03-16 NOTE — Progress Notes (Signed)
Patient ID: [name] Young, female   DOB: September 27, 1970, 45 y.o.   MRN: [id number]  HPI: [name] Young is a 45 y.o.-year-old female-year-old female, returning for f/u for DM2, dx in 2008 (GDM 1998), non-insulin-dependent, controlled, without complications. Last visit 3 mo ago.  Since last visit, her best friend is in a coma (hit and run accident) and aunt is dying. She is very upset about this.  DM2: Last hemoglobin A1c was: Lab Results  Component Value Date   HGBA1C 6.5 12/13/2015   HGBA1C 6.4 09/13/2015   HGBA1C 7.6 06/14/2015  Since she was dx with DM >> she lost 50 lbs and was able to come off Lantus.  Pt is on a regimen of: - Metformin 1000 mg 2x a day - Invokana 100 mg - started 09/2014 - Januvia 100 mg daily - started 12/2014 - Regular Glipizide 2.5 mg 2x a day  Pt checks her sugars 1-2x a day and they are: - am: 96-129, 147 >> 74, 84-115 >> 116-212 >> 91, 102-165 (230 - forgot meds) >> 90-135, 145 >> 81-137, 160, 211 - 2h after b'fast: n/c >> 120, 225 (poptarts) >> 176-268 >> 89 >> 305 >> n/c >> 185 >> 190 - before lunch: n/c >> 85-105 >> 65 (late lunch), 107-128 >> 83, 105 >> 141 >> 60, 92-113 >> 120 >> n/c - >2h after lunch: n/c >> 79-93, 194 >> 156-243 >> 174  >> 159-209 >> n/c >> 158 >> n/c - before dinner: 150, 165 (not always fasting) >> 60-140 >> 68-99 >> 107-130, 247 >> 107-114, 176 >> n/c >> 106-112 - 2h after dinner: n/c >> 105-176, 213 >> 235-298 >> 88-136 >> 195 >> 153, 219 >> n/c - bedtime:  N/c >> 144, 157 >> n/c  - nighttime: n/c >> 68, 180 >> n/c >> 58 (skipped dinner) >> n/c No lows. Lowest sugar was 68 >> 59 - skipped a meal >> 90s >> 81; she has hypoglycemia awareness at 60.  Highest sugar was 208 >> 225 >> 298 >> 174 >> 230 >> 185 >> 211.  Glucometer: Bayer  Pt's meals are: - Breakfast: eggs, [place] bacon; green smoothies - Lunch: soup, chicken salad - Dinner: zucchini + chicken + rice - Snacks: Smoothies, jelly belly  - no CKD, last BUN/creatinine:  Lab Results   Component Value Date   BUN 12 03/22/2015   CREATININE 0.86 03/22/2015  Was on Lisinopril >> stopped >> ACR normal after stopping: Microalb, Ur 10/27/2014 <0.7  0.0 - 1.9 mg/dL Final  Creatinine,U 10/27/2014 94.2   Final  Microalb Creat Ratio 10/27/2014 0.7  0.0 - 30.0 mg/g Final   - last set of lipids: Lab Results  Component Value Date   CHOL 160 03/22/2015   HDL 61.90 03/22/2015   LDLCALC 65 01/28/2013   LDLDIRECT 62.0 03/22/2015   TRIG 206.0 (H) 03/22/2015   CHOLHDL 3 03/22/2015  On Zocor. - last eye exam was in 01/2016. No DR.  - + numbness and tingling in her foot  Vit B12 def. - normalized at last check, on 1000 mcg daily now + the amount in B-complex Feels better >> more energy, less hair loss.  ROS: Constitutional: no weight gain, no fatigue, no subjective hyperthermia/hypothermia Eyes: no blurry vision, no xerophthalmia ENT: no sore throat, no nodules palpated in throat, no dysphagia/odynophagia, no hoarseness Cardiovascular: no CP/SOB/palpitations/leg swelling Respiratory: no cough/SOB Gastrointestinal: no N/V/D/C Musculoskeletal: no muscle/joint aches Skin: no rashes, + hair loss >> improved Neurological: no tremors/numbness/tingling/dizziness  I  reviewed pt's medications, allergies, PMH, social hx, family hx, and changes were documented in the history of present illness. Otherwise, unchanged from my initial visit note.  Past Medical History:  Diagnosis Date  . Diabetes mellitus    Type II   No past surgical history on file. History   Social History  . Marital Status: Married    Spouse Name: N/A  . 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 Smoking status: Never Smoker   . Smokeless tobacco: Not on file  . Alcohol Use: Socially; liquor  . Drug Use: No   Social History Narrative   Regular exercise - 2-3x a week   Current Outpatient Prescriptions on File Prior to Visit  Medication Sig Dispense  Refill  . ACCU-CHEK FASTCLIX LANCETS MISC USE AS DIRECTED 102 each 5  . ACCU-CHEK SMARTVIEW test strip USE TO TEST AS DIRECTED 100 each 5  . acyclovir (ZOVIRAX) 400 MG tablet Take 2 tablets (800 mg total) by mouth 2 (two) times daily. 50 tablet 3  . B Complex-C (SUPER B COMPLEX PO) Take 1 tablet by mouth daily.    . cholecalciferol (VITAMIN D) 1000 UNITS tablet Take 1,000 Units by mouth daily.    . fluticasone (FLONASE) 50 MCG/ACT nasal spray INSTILL 1 SPRAY IN EACH NOSTRIL AT BEDTIME 16 g 6  . glipiZIDE (GLUCOTROL) 10 MG tablet Take 0.5 tablets (5 mg total) by mouth 2 (two) times daily before a meal. 60 tablet 1  . INVOKANA 100 MG TABS tablet TAKE 1 TABLET BY MOUTH ONCE DAILY 30 tablet 5  . JANUVIA 100 MG tablet TAKE 1 TABLET BY MOUTH DAILY. 30 tablet 5  . metFORMIN (GLUCOPHAGE) 1000 MG tablet Take 2 tablets by mouth with dinner 200 tablet 4  . Multiple Vitamins-Minerals (DIASENSE MULTIVITAMIN) TABS Take 1 tablet by mouth daily.      . norethindrone-ethinyl estradiol 1/35 (NORTREL 1/35, 28,) tablet TAKE 1 TABLET BY MOUTH DAILY 84 tablet 4  . simvastatin (ZOCOR) 20 MG tablet TAKE 1 TABLET BY MOUTH AT BEDTIME. 100 tablet 4  . vitamin B-12 (CYANOCOBALAMIN) 1000 MCG tablet Take 1,000 mcg by mouth daily.     . vitamin C (ASCORBIC ACID) 500 MG tablet Take 500 mg by mouth daily.     No current facility-administered medications on file prior to visit.    Allergies  Allergen Reactions  . Penicillins     REACTION: RASH   Family History  Problem Relation Age of Onset  . Diabetes Other     Familly Hx First degree relative   PE: BP 120/74 (BP Location: Left Arm, Patient Position: Sitting)   Pulse 84   Ht 5\' 7"  (1.702 m)   Wt 157 lb (71.2 kg)   LMP 01/31/2016   SpO2 95%   BMI 24.59 kg/m  Body mass index is 24.59 kg/m. Wt Readings from Last 3 Encounters:  03/16/16 157 lb (71.2 kg)  03/09/16 152 lb (68.9 kg)  02/04/16 157 lb (71.2 kg)   Constitutional: normal weight, in NAD Eyes:  PERRLA, EOMI, no exophthalmos ENT: moist mucous membranes, no thyromegaly, no cervical lymphadenopathy Cardiovascular: RRR, No MRG Respiratory: CTA B Gastrointestinal: abdomen soft, NT, ND, BS+ Musculoskeletal: no deformities, strength intact in all 4 Skin: moist, warm, no rashes Neurological: no tremor with outstretched hands, DTR normal in all 4  ASSESSMENT: 1. DM2, non-insulin-dependent, controlled, without lon term complications, but with occasional hyperglycemia  2. B12 deficiency  PLAN:  1. Patient with long-standing, fairly well controlled diabetes, on oral antidiabetic regimen - better sugars after starting Glipizide and moving all Metformin at night. She has few CBG spikes, but infrequently (after fast food). - she had 1 low CBG: 53 at bedtime >> advised her to check more sugars at this time and stop the Glipizide before dinner if they are <100. - I suggested to:  Patient Instructions  Please continue: - Metformin 2000 mg at dinner  - Invokana 100 mg daily - Januvia 100 mcg daily  - Regular Glipizide 2.5 mg 2x a day  Please come back for a follow-up appointment in 4 months.  Please stop at the lab.  - continue checking sugars at different times of the day - check 1x times a day, rotating checks - advised for yearly eye exams >> she is UTD - will check CMP, ACR, Lipids today - will check a HbA1c today >> 6.6% (stable, great!) - Return to clinic in 4 mo with sugar log   2. B12 deficiency - started 2000 mcg >> decreased to 1000 mcg daily >> rechecked level was normal 09/2015 - will recheck today  Office Visit on 03/16/2016  Component Date Value Ref Range Status  . Hemoglobin A1C 03/16/2016 6.5   Final  . Hgb A1c MFr Bld 03/16/2016 6.6* 4.6 - 6.5 % Final  . Microalb, Ur 03/16/2016 <0.7  0.0 - 1.9 mg/dL Final  . Creatinine,U 03/16/2016 96.3  mg/dL Final  . Microalb Creat Ratio 03/16/2016 0.7  0.0 - 30.0 mg/g Final  . Sodium 03/16/2016 140  135 - 146 mmol/L Final  .  Potassium 03/16/2016 4.1  3.5 - 5.3 mmol/L Final  . Chloride 03/16/2016 101  98 - 110 mmol/L Final  . CO2 03/16/2016 25  20 - 31 mmol/L Final  . Glucose, Bld 03/16/2016 90  65 - 99 mg/dL Final  . BUN 03/16/2016 19  7 - 25 mg/dL Final  . Creat 03/16/2016 0.73  0.50 - 1.10 mg/dL Final  . Total Bilirubin 03/16/2016 0.5  0.2 - 1.2 mg/dL Final  . Alkaline Phosphatase 03/16/2016 52  33 - 115 U/L Final  . AST 03/16/2016 21  10 - 35 U/L Final  . ALT 03/16/2016 25  6 - 29 U/L Final  . Total Protein 03/16/2016 7.2  6.1 - 8.1 g/dL Final  . Albumin 03/16/2016 4.4  3.6 - 5.1 g/dL Final  . Calcium 03/16/2016 9.9  8.6 - 10.2 mg/dL Final  . GFR, Est African American 03/16/2016 >89  >=60 mL/min Final  . GFR, Est Non African American 03/16/2016 >89  >=60 mL/min Final  . Cholesterol 03/16/2016 150  0 - 200 mg/dL Final  . Triglycerides 03/16/2016 130.0  0.0 - 149.0 mg/dL Final  . HDL 03/16/2016 61.70  >39.00 mg/dL Final  . VLDL 03/16/2016 26.0  0.0 - 40.0 mg/dL Final  . LDL Cholesterol 03/16/2016 63  0 - 99 mg/dL Final  . Total CHOL/HDL Ratio 03/16/2016 2   Final  . NonHDL 03/16/2016 88.60   Final  . Vitamin B-12 03/16/2016 969* 211 - 911 pg/mL Final   Excellent results!  Philemon Kingdom, MD PhD [facility] Endocrinology

## 2016-03-16 NOTE — Patient Instructions (Signed)
Please continue: - Metformin 2000 mg at dinner  - Invokana 100 mg daily - Januvia 100 mcg daily  - Regular Glipizide 2.5 mg 2x a day  Please come back for a follow-up appointment in 3 months.  Please stop at the lab.

## 2016-04-03 ENCOUNTER — Other Ambulatory Visit (HOSPITAL_COMMUNITY)
Admission: RE | Admit: 2016-04-03 | Discharge: 2016-04-03 | Disposition: A | Discharge status: Home / Self Care | Payer: 59 | Source: Ambulatory Visit | Attending: Family Medicine | Admitting: Family Medicine

## 2016-04-03 ENCOUNTER — Ambulatory Visit (INDEPENDENT_AMBULATORY_CARE_PROVIDER_SITE_OTHER): Payer: 59 | Admitting: Family Medicine

## 2016-04-03 ENCOUNTER — Encounter: Payer: Self-pay | Admitting: Family Medicine

## 2016-04-03 VITALS — BP 112/82 | HR 78 | Temp 98.1°F | Ht 66.0 in | Wt 159.0 lb

## 2016-04-03 DIAGNOSIS — IMO0001 Reserved for inherently not codable concepts without codable children: Secondary | ICD-10-CM

## 2016-04-03 DIAGNOSIS — Z0000 Encounter for general adult medical examination without abnormal findings: Secondary | ICD-10-CM | POA: Diagnosis not present

## 2016-04-03 DIAGNOSIS — Z01419 Encounter for gynecological examination (general) (routine) without abnormal findings: Secondary | ICD-10-CM | POA: Insufficient documentation

## 2016-04-03 DIAGNOSIS — Z124 Encounter for screening for malignant neoplasm of cervix: Secondary | ICD-10-CM

## 2016-04-03 DIAGNOSIS — E1165 Type 2 diabetes mellitus with hyperglycemia: Secondary | ICD-10-CM

## 2016-04-03 DIAGNOSIS — Z1151 Encounter for screening for human papillomavirus (HPV): Secondary | ICD-10-CM | POA: Diagnosis not present

## 2016-04-03 DIAGNOSIS — K219 Gastro-esophageal reflux disease without esophagitis: Secondary | ICD-10-CM

## 2016-04-03 DIAGNOSIS — Z23 Encounter for immunization: Secondary | ICD-10-CM | POA: Diagnosis not present

## 2016-04-03 DIAGNOSIS — Z803 Family history of malignant neoplasm of breast: Secondary | ICD-10-CM

## 2016-04-03 DIAGNOSIS — E119 Type 2 diabetes mellitus without complications: Secondary | ICD-10-CM

## 2016-04-03 MED ORDER — GLIPIZIDE 10 MG PO TABS: 5.0000 mg | ORAL_TABLET | Freq: Two times a day (BID) | ORAL | 3 refills | Status: DC

## 2016-04-03 MED FILL — glipiZIDE 10 MG TABS: 10 | 90 days supply | Qty: 90 | Fill #0

## 2016-04-03 NOTE — Progress Notes (Signed)
[place] [facility] [address] [place], [place] [address] [phone] [id number] [phone]  Date:  04/03/2016   Name:  [name] Young   DOB:  April 30, 1971   MRN:  [id number]  PCP:  Joycelyn Man, MD    Chief Complaint: Annual Exam (Pt here for annual exam. due for tetanus and would like flu. )   History of Present Illness:  [name] Young is a 45 y.o. very pleasant female patient who presents with the following:  Here today as a new patient to myself but not to [employer]. Her PCP Dr. Sherren Mocha is retiring soon History of DM- she sees Dr. Cruzita Lederer for this and is under good control.  She is trying to exercise more and is watching her diet.   She had labs earlier this month- cholesterol is at goal, negative urine protein She is due for eye exam, and for a tetanus shot.  Flu shot today  She is menstruating this   She would like a flu shot and tetanus shot today- she needs tdap mammo is UTD- she just had an [place] for a palpable mass that was negative Her mother was dx with breast cancer at age 56- she died at 11 of complications. Her maternal aunt was also recently dx with breast cancer She would like to consider having genetic testing for this, mostly for her daughter's sake.    Noted pap from 2015 showed negative cytology but positive HRHPV.   Does not specify type 16 or 18.  She has never had an abnl pap in the past She is menstruating today- she is still on OCP  She has also noted some GERD sx off an on, over the last year or so.  She did take 1 dose of protonix today but otherwise has just used PRN tums No nausea or vomiting, she is able to eat well No fever or chills No rash- she does have a history of dysplastic moles but has not noted any concerning skin lesions recently. No history of skin cancer  Lab Results  Component Value Date   HGBA1C 6.6 (H) 03/16/2016     Patient Active Problem List   Diagnosis Date Noted  . Type 2 diabetes  mellitus with hyperglycemia ([place]) 09/13/2015  . Nonrheumatic mitral valve prolapse 03/29/2015  . Vitamin B12 deficiency 03/12/2015  . Dysplastic nevus of trunk 03/05/2012  . HSV (herpes simplex virus) infection 02/06/2012  . DUB (dysfunctional uterine bleeding) 02/06/2012  . Dysplastic nevus of lower extremity, right 12/12/2011  . SHOULDER PAIN, RIGHT 05/17/2009  . Hyperlipidemia 10/23/2008  . ALLERGIC REACTION 07/10/2007  . SORE THROAT 07/05/2007    Past Medical History:  Diagnosis Date  . Diabetes mellitus    Type II    No past surgical history on file.  Social History  Substance Use Topics  . Smoking status: Never Smoker  . Smokeless tobacco: Not on file  . Alcohol use No    Family History  Problem Relation Age of Onset  . Diabetes Other     Familly Hx First degree relative    Allergies  Allergen Reactions  . Penicillins     REACTION: RASH    Medication list has been reviewed and updated.  Current Outpatient Prescriptions on File Prior to Visit  Medication Sig Dispense Refill  . ACCU-CHEK FASTCLIX LANCETS MISC USE AS DIRECTED 102 each 5  . ACCU-CHEK SMARTVIEW test strip USE TO TEST AS DIRECTED 100 each 5  .  acyclovir (ZOVIRAX) 400 MG tablet Take 2 tablets (800 mg total) by mouth 2 (two) times daily. 50 tablet 3  . B Complex-C (SUPER B COMPLEX PO) Take 1 tablet by mouth daily.    . cholecalciferol (VITAMIN D) 1000 UNITS tablet Take 1,000 Units by mouth daily.    . fluticasone (FLONASE) 50 MCG/ACT nasal spray INSTILL 1 SPRAY IN EACH NOSTRIL AT BEDTIME 16 g 6  . glipiZIDE (GLUCOTROL) 10 MG tablet Take 0.5 tablets (5 mg total) by mouth 2 (two) times daily before a meal. 60 tablet 1  . INVOKANA 100 MG TABS tablet TAKE 1 TABLET BY MOUTH ONCE DAILY 30 tablet 5  . JANUVIA 100 MG tablet TAKE 1 TABLET BY MOUTH DAILY. 30 tablet 5  . metFORMIN (GLUCOPHAGE) 1000 MG tablet Take 2 tablets by mouth with dinner 200 tablet 4  . Multiple Vitamins-Minerals (DIASENSE MULTIVITAMIN)  TABS Take 1 tablet by mouth daily.      . norethindrone-ethinyl estradiol 1/35 (NORTREL 1/35, 28,) tablet TAKE 1 TABLET BY MOUTH DAILY 84 tablet 4  . simvastatin (ZOCOR) 20 MG tablet TAKE 1 TABLET BY MOUTH AT BEDTIME. 100 tablet 4  . vitamin B-12 (CYANOCOBALAMIN) 1000 MCG tablet Take 1,000 mcg by mouth daily.     . vitamin C (ASCORBIC ACID) 500 MG tablet Take 500 mg by mouth daily.     No current facility-administered medications on file prior to visit.     Review of Systems:  As per HPI- otherwise negative.   Physical Examination: Vitals:   04/03/16 0843  BP: 112/82  Pulse: 78  Temp: 98.1 F (36.7 C)   Vitals:   04/03/16 0843  Weight: 159 lb (72.1 kg)  Height: 5\' 6"  (1.676 m)   Body mass index is 25.66 kg/m. Ideal Body Weight: Weight in (lb) to have BMI = 25: 154.6  GEN: WDWN, NAD, Non-toxic, A & O x 3, lookjs well HEENT: Atraumatic, Normocephalic. Neck supple. No masses, No LAD.  Bilateral TM wnl, oropharynx normal.  PEERL,EOMI.   Ears and Nose: No external deformity. CV: RRR, No M/G/R. No JVD. No thrill. No extra heart sounds. PULM: CTA B, no wheezes, crackles, rhonchi. No retractions. No resp. distress. No accessory muscle use. ABD: S, NT, ND. No rebound. No HSM. EXTR: No c/c/e NEURO Normal gait.  PSYCH: Normally interactive. Conversant. Not depressed or anxious appearing.  Calm demeanor.  Pelvic: normal, no vaginal lesions or discharge. Uterus normal, no CMT, no adnexal tendereness or masses  Assessment and Plan: Physical exam  Screening for cervical cancer - Plan: Cytology - PAP  Gastroesophageal reflux disease, esophagitis presence not specified - Plan: H. pylori breath test  Immunization due - Plan: Tdap vaccine greater than or equal to 7yo IM  Type 2 diabetes mellitus without complication, unspecified long term insulin use status ([place]) - Plan: glipiZIDE (GLUCOTROL) 10 MG tablet  Uncontrolled diabetes mellitus type 2 without complications, unspecified long  term insulin use status ([facility]) - Plan: glipiZIDE (GLUCOTROL) 10 MG tablet  Family history of breast cancer - Plan: Ambulatory referral to Genetics  Here today for a CPE Her DM is well controlled and treated by her endocrinologist Updated vacs today She would like to have genetic testing for breast cancer- did referral for her today History of HPV pap- repeated pap and HPV today H pylori breath test today  Signed Lamar Blinks, MD

## 2016-04-03 NOTE — Progress Notes (Signed)
Pre visit review using our clinic review tool, if applicable. No additional management support is needed unless otherwise documented below in the visit note. 

## 2016-04-03 NOTE — Patient Instructions (Addendum)
It was very nice to see you today!  You got your tetanus shot (tdap, also covers pertussis) and flu shot today.  We repeated your pap, and I will consult an OBG colleague about any follow-up that may be needed.  However I think that as long as your pap and HPV are negative we are all set  I will be in touch with your H pylori test results asap  I will also look into genetic testing for breast cancer for you and will send you a message  Your blood pressure and recent cholesterol look great.  Continue to stay active and try to follow a health diet with plenty of fruits and vegetables.   If you have not had an eye exam in the last year or so please try to do this soon

## 2016-04-04 ENCOUNTER — Encounter: Payer: Self-pay | Admitting: Family Medicine

## 2016-04-04 LAB — H. PYLORI BREATH TEST: H. pylori Breath Test: NOT DETECTED

## 2016-04-04 LAB — CYTOLOGY - PAP

## 2016-04-12 ENCOUNTER — Telehealth: Payer: Self-pay | Admitting: Genetic Counselor

## 2016-04-12 NOTE — Telephone Encounter (Signed)
Pt confirmed appt, completed intake, requested no letter be mailed, in basket referring provider appt date/time.

## 2016-04-18 ENCOUNTER — Other Ambulatory Visit: Payer: Self-pay | Admitting: Family Medicine

## 2016-04-21 ENCOUNTER — Other Ambulatory Visit: Payer: Self-pay | Admitting: Family Medicine

## 2016-04-21 DIAGNOSIS — E119 Type 2 diabetes mellitus without complications: Secondary | ICD-10-CM

## 2016-04-21 MED FILL — ACYCLOVIR 400 MG TABLET: 400 | 13 days supply | Qty: 50 | Fill #0

## 2016-04-21 MED FILL — metFORMIN HCL 1000 MG TABS: 1000 | 90 days supply | Qty: 180 | Fill #0

## 2016-04-28 ENCOUNTER — Telehealth: Payer: Self-pay | Admitting: Emergency Medicine

## 2016-04-28 NOTE — Telephone Encounter (Signed)
Faxed flu vaccine documentation per pt request to fax # [phone].

## 2016-05-01 ENCOUNTER — Other Ambulatory Visit: Payer: Self-pay | Admitting: Internal Medicine

## 2016-05-01 MED FILL — INVOKANA 100 MG TABLET: 100 | 30 days supply | Qty: 30 | Fill #0

## 2016-05-01 MED FILL — JANUVIA 100 MG TABLET: 100 | 90 days supply | Qty: 90 | Fill #1

## 2016-05-17 MED FILL — CONTOUR NEXT STRIPS: 90 days supply | Qty: 200 | Fill #1

## 2016-05-26 ENCOUNTER — Encounter: Payer: Self-pay | Admitting: Genetic Counselor

## 2016-05-29 ENCOUNTER — Ambulatory Visit (HOSPITAL_BASED_OUTPATIENT_CLINIC_OR_DEPARTMENT_OTHER): Payer: 59 | Admitting: Genetic Counselor

## 2016-05-29 ENCOUNTER — Encounter: Payer: Self-pay | Admitting: Genetic Counselor

## 2016-05-29 ENCOUNTER — Other Ambulatory Visit: Payer: 59

## 2016-05-29 DIAGNOSIS — Z315 Encounter for genetic counseling: Secondary | ICD-10-CM

## 2016-05-29 DIAGNOSIS — Z803 Family history of malignant neoplasm of breast: Secondary | ICD-10-CM | POA: Diagnosis not present

## 2016-05-29 NOTE — Progress Notes (Signed)
REFERRING PROVIDER: Darreld Mclean, MD [address] [place], [place] [address]  PRIMARY PROVIDER:  Lamar Blinks, MD  PRIMARY REASON FOR VISIT:  1. Family history of breast cancer      HISTORY OF PRESENT ILLNESS:   [name], a 45 y.o. female, was seen for a [facility] cancer genetics consultation, with her husband Arva Chafe, at the request of Dr. Lorelei Pont due to a family history of cancer.  [name] presents to clinic today to discuss the possibility of a hereditary predisposition to cancer, genetic testing, and to further clarify her future cancer risks, as well as potential cancer risks for family members. [name] is a 45 y.o. female with no personal history of cancer.  She is worried about her daughter's risk for breast cancer and is pursuing testing to learn if there is a hereditary cause for the cancer in the family.  CANCER HISTORY:   No history exists.     HORMONAL RISK FACTORS:  Menarche was at age 44.  First live birth at age 88.  OCP use for approximately 29 years.  Ovaries intact: yes.  Hysterectomy: no.  Menopausal status: premenopausal.  HRT use: 0 years. Colonoscopy: no; not examined. Mammogram within the last year: yes. Number of breast biopsies: 0. Up to date with pelvic exams:  yes. Any excessive radiation exposure in the past:  no  Past Medical History:  Diagnosis Date  . Diabetes mellitus    Type II  . Family history of breast cancer     No past surgical history on file.  Social History   Social History  . Marital status: Married    Spouse name: N/A  . Number of children: N/A  . Years of education: N/A   Social History Main Topics  . Smoking status: Never Smoker  . Smokeless tobacco: Not on file  . Alcohol use No  . Drug use: No  . Sexual activity: Not on file   Other Topics Concern  . Not on file   Social History Narrative   Regular exercise-No     FAMILY HISTORY:  We obtained a detailed, 4-generation family  history.  Significant diagnoses are listed below: Family History  Problem Relation Age of Onset  . Diabetes Other     Familly Hx First degree relative  . Breast cancer Mother 95  . Breast cancer Maternal Aunt 64  . Brain cancer Maternal Uncle 10    The patient has one daughter who is cancer free.  She is an only child.  Her mother was diagnosed with breast cancer at 51 and died at 54.  Her mother had three brothers and two sisters.  One sister developed breast cancer at 39 and a brother had brain cancer at 36.  There is no other reported family history of cancer on the maternal or paternal side of the family.  Patient's maternal ancestors are of [place] descent, and paternal ancestors are of [place] and [place] descent. There is no reported Ashkenazi Jewish ancestry. There is no known consanguinity.  GENETIC COUNSELING ASSESSMENT: [name] Young is a 45 y.o. female with a family history of breast cancer which is somewhat suggestive of a hereditary cancer syndrome and predisposition to cancer. We, therefore, discussed and recommended the following at today's visit.   DISCUSSION: We discussed that about 5-10% of breast cancer is hereditary with most cases due to BRCA mutations.  We discussed that there are other causes of hereditary breast cancer, most commonly due  to mutations in ATM, CHEK2 or PALB2.  We reviewed the characteristics, features and inheritance patterns of hereditary cancer syndromes. We also discussed genetic testing, including the appropriate family members to test, the process of testing, insurance coverage and turn-around-time for results. We discussed the implications of a negative, positive and/or variant of uncertain significant result. We recommended [name] pursue genetic testing for the Breast/Ovarian cancer gene panel. The Breast/Ovarian gene panel offered by [facility] includes sequencing and rearrangement analysis for the following 20 genes:  ATM, BARD1, BRCA1, BRCA2,  BRIP1, CDH1, CHEK2, EPCAM, FANCC, MLH1, MSH2, MSH6, NBN, PALB2, PMS2, PTEN, RAD51C, RAD51D, TP53, and XRCC2.     Based on [name]'s family history of cancer, she meets medical criteria for genetic testing. Despite that she meets criteria, she may still have an out of pocket cost. We discussed that if her out of pocket cost for testing is over $100, the laboratory will call and confirm whether she wants to proceed with testing.  If the out of pocket cost of testing is less than $100 she will be billed by the genetic testing laboratory.   In order to estimate her chance of having a BRCA mutation, we used statistical models ([employer] and [employer]) and laboratory data that take into account her personal medical history, family history and ancestry.  Because each model is different, there can be a lot of variability in the risks they give.  Therefore, these numbers must be considered a rough range and not a precise risk of having a BRCA mutation.  These models estimate that she has approximately a 1.18-6% chance of having a mutation. Based on this assessment of her family and personal history, genetic testing is recommended.  Based on the patient's personal and family history, statistical models (Tyrer Cusik)  and literature data were used to estimate her risk of developing breast cancer. This estimates her lifetime risk of developing breast cancer to be approximately 19%. This estimation does not take into account any genetic testing results.  The patient's lifetime breast cancer risk is a preliminary estimate based on available information using one of several models endorsed by the [place] (ACS). The ACS recommends consideration of breast MRI screening as an adjunct to mammography for patients at high risk (defined as 20% or greater lifetime risk). A more detailed breast cancer risk assessment can be considered, if clinically indicated.   PLAN: After considering the risks, benefits, and  limitations, [name]  provided informed consent to pursue genetic testing and the blood sample was sent to [facility] for analysis of the Breast/Ovarian cancer panel. Results should be available within approximately 2-3 weeks' time, at which point they will be disclosed by telephone to Ms. Postema, as will any additional recommendations warranted by these results. Ms. Mccurley will receive a summary of her genetic counseling visit and a copy of her results once available. This information will also be available in Epic. We encouraged Ms. Ebers to remain in contact with cancer genetics annually so that we can continuously update the family history and inform her of any changes in cancer genetics and testing that may be of benefit for her family. Ms. Stokke questions were answered to her satisfaction today. Our contact information was provided should additional questions or concerns arise.  Lastly, we encouraged Ms. Marrs to remain in contact with cancer genetics annually so that we can continuously update the family history and inform her of any changes in cancer genetics and testing that  may be of benefit for this family.   Ms.  Mccoy questions were answered to her satisfaction today. Our contact information was provided should additional questions or concerns arise. Thank you for the referral and allowing [place] to share in the care of your patient.   Alecia Doi P. Florene Glen, [place], [place] Certified Genetic Counselor [email or web address] phone: [phone]  The patient was seen for a total of 60 minutes in face-to-face genetic counseling.  This patient was discussed with Drs. Magrinat, Lindi Adie and/or Burr Medico who agrees with the above.    _______________________________________________________________________ For Office Staff:  Number of people involved in session: 2 Was an Intern/ student involved with case: no

## 2016-05-30 ENCOUNTER — Other Ambulatory Visit: Payer: Self-pay | Admitting: Emergency Medicine

## 2016-05-30 MED ORDER — SIMVASTATIN 20 MG PO TABS: 20.0000 mg | ORAL_TABLET | Freq: Every day | ORAL | 1 refills | Status: DC

## 2016-05-30 MED ORDER — NORETHINDRONE-ETH ESTRADIOL 1-35 MG-MCG PO TABS: 1.0000 | ORAL_TABLET | Freq: Every day | ORAL | 11 refills | Status: DC

## 2016-05-30 MED FILL — INVOKANA 100 MG TABLET: 100 | 30 days supply | Qty: 30 | Fill #1

## 2016-05-30 MED FILL — SIMVASTATIN 20 MG TABLET: 20 | 90 days supply | Qty: 90 | Fill #0

## 2016-05-30 MED FILL — DASETTA 1-35-28 TABLET: 1-35 | 84 days supply | Qty: 84 | Fill #0

## 2016-05-30 NOTE — Telephone Encounter (Signed)
Received medication refill for Dasetta 1-35-28 tablet  but  norethindrone ethinyl estradiol 1/35 (Nortrel 1/35,8) tablet is currently on pt's med list. Called pt to verify which medication she needs filled. Per pt [place] has the most current rx information. Sent refill for Dasetta 1-35-28 tablet per pt request.

## 2016-06-07 DIAGNOSIS — Z803 Family history of malignant neoplasm of breast: Secondary | ICD-10-CM | POA: Diagnosis not present

## 2016-06-15 ENCOUNTER — Other Ambulatory Visit: Payer: Self-pay | Admitting: Emergency Medicine

## 2016-06-15 DIAGNOSIS — T7840XD Allergy, unspecified, subsequent encounter: Secondary | ICD-10-CM

## 2016-06-15 MED ORDER — FLUTICASONE PROPIONATE 50 MCG/ACT NA SUSP: NASAL | 6 refills | Status: DC

## 2016-06-16 MED FILL — FLUTICASONE PROP 50 MCG SPR: 50 | 60 days supply | Qty: 16 | Fill #0

## 2016-06-20 ENCOUNTER — Telehealth: Payer: Self-pay | Admitting: Genetic Counselor

## 2016-06-20 NOTE — Telephone Encounter (Signed)
LM on VM with good news.  Asked that she call back. 

## 2016-06-21 NOTE — Telephone Encounter (Signed)
Revealed negative genetic testing.  We will provide information to her provider.

## 2016-06-26 ENCOUNTER — Encounter: Payer: Self-pay | Admitting: Genetic Counselor

## 2016-06-26 ENCOUNTER — Ambulatory Visit: Payer: Self-pay | Admitting: Genetic Counselor

## 2016-06-26 DIAGNOSIS — Z1379 Encounter for other screening for genetic and chromosomal anomalies: Secondary | ICD-10-CM | POA: Insufficient documentation

## 2016-06-26 DIAGNOSIS — Z803 Family history of malignant neoplasm of breast: Secondary | ICD-10-CM

## 2016-06-26 MED FILL — INVOKANA 100 MG TABLET: 100 | 30 days supply | Qty: 30 | Fill #2

## 2016-06-26 NOTE — Progress Notes (Signed)
HPI: [name] Young was previously seen in the [facility] clinic due to a family history of cancer and concerns regarding a hereditary predisposition to cancer. Please refer to our prior cancer genetics clinic note for more information regarding [name] Young's medical, social and family histories, and our assessment and recommendations, at the time. [name] Young recent genetic test results were disclosed to her, as were recommendations warranted by these results. These results and recommendations are discussed in more detail below.  CANCER HISTORY:   No history exists.    FAMILY HISTORY:  We obtained a detailed, 4-generation family history.  Significant diagnoses are listed below: Family History  Problem Relation Age of Onset  . Diabetes Other     Familly Hx First degree relative  . Breast cancer Mother 35  . Breast cancer Maternal Aunt 64  . Brain cancer Maternal Uncle 14    The patient has one daughter who is cancer free.  She is an only child.  Her mother was diagnosed with breast cancer at 59 and died at 75.  Her mother had three brothers and two sisters.  One sister developed breast cancer at 103 and a brother had brain cancer at 37.  There is no other reported family history of cancer on the maternal or paternal side of the family.  Patient's maternal ancestors are of [place] descent, and paternal ancestors are of [place] and [place] descent. There is no reported Ashkenazi Jewish ancestry. There is no known consanguinity.  GENETIC TEST RESULTS: Genetic testing reported out on June 19, 2016 through the Breast/Ovarian cancer panel found no deleterious mutations.  The Breast/Ovarian gene panel offered by [facility] includes sequencing and rearrangement analysis for the following 20 genes:  ATM, BARD1, BRCA1, BRCA2, BRIP1, CDH1, CHEK2, EPCAM, FANCC, MLH1, MSH2, MSH6, NBN, PALB2, PMS2, PTEN, RAD51C, RAD51D, TP53, and XRCC2.   The test report has been scanned into EPIC and is  located under the Molecular Pathology section of the Results Review tab.   We discussed with [name] Young that since the current genetic testing is not perfect, it is possible there may be a gene mutation in one of these genes that current testing cannot detect, but that chance is small. We also discussed, that it is possible that another gene that has not yet been discovered, or that we have not yet tested, is responsible for the cancer diagnoses in the family, and it is, therefore, important to remain in touch with cancer genetics in the future so that we can continue to offer [name] Young the most up to date genetic testing.   CANCER SCREENING RECOMMENDATIONS:This normal result is reassuring and indicates that [name] Young does not likely have an increased risk of cancer due to a mutation in one of these genes.  We, therefore, recommended  [name] Young continue to follow the cancer screening guidelines provided by her primary healthcare providers.   RECOMMENDATIONS FOR FAMILY MEMBERS: Women in this family might be at some increased risk of developing cancer, over the general population risk, simply due to the family history of cancer. We recommended women in this family have a yearly mammogram beginning at age 86, or 22 years younger than the earliest onset of cancer, an annual clinical breast exam, and perform monthly breast self-exams. Women in this family should also have a gynecological exam as recommended by their primary provider. All family members should have a colonoscopy by age 17.  FOLLOW-UP: Lastly, we discussed with [name] Young that cancer genetics is  a rapidly advancing field and it is possible that new genetic tests will be appropriate for her and/or her family members in the future. We encouraged her to remain in contact with cancer genetics on an annual basis so we can update her personal and family histories and let her know of advances in cancer genetics that may benefit this family.    Our contact number was provided. [name] Young questions were answered to her satisfaction, and she knows she is welcome to call us at anytime with additional questions or concerns.   Roma Kayser, MS, [facility] Certified Genetic Counselor [name]

## 2016-07-06 MED FILL — glipiZIDE 10 MG TABS: 10 | 90 days supply | Qty: 90 | Fill #1

## 2016-07-17 ENCOUNTER — Ambulatory Visit: Payer: 59 | Admitting: Pharmacist

## 2016-07-17 ENCOUNTER — Ambulatory Visit: Payer: 59 | Admitting: Internal Medicine

## 2016-07-28 ENCOUNTER — Other Ambulatory Visit: Payer: Self-pay | Admitting: Internal Medicine

## 2016-07-28 MED FILL — JANUVIA 100 MG TABLET: 100 | 90 days supply | Qty: 90 | Fill #0

## 2016-07-28 MED FILL — metFORMIN HCL 1000 MG TABS: 1000 | 90 days supply | Qty: 180 | Fill #1

## 2016-07-28 MED FILL — INVOKANA 100 MG TABLET: 100 | 30 days supply | Qty: 30 | Fill #3

## 2016-08-23 MED FILL — FLUTICASONE PROP 50 MCG SPR: 50 | 60 days supply | Qty: 16 | Fill #1

## 2016-08-23 MED FILL — ACYCLOVIR 400 MG TABLET: 400 | 13 days supply | Qty: 50 | Fill #1

## 2016-08-25 MED FILL — DASETTA 1-35-28 TABLET: 1-35 | 84 days supply | Qty: 84 | Fill #1

## 2016-08-25 MED FILL — SIMVASTATIN 20 MG TABLET: 20 | 90 days supply | Qty: 90 | Fill #1

## 2016-08-25 MED FILL — INVOKANA 100 MG TABLET: 100 | 30 days supply | Qty: 30 | Fill #4

## 2016-09-01 ENCOUNTER — Ambulatory Visit: Payer: 59 | Admitting: Internal Medicine

## 2016-09-07 ENCOUNTER — Ambulatory Visit (INDEPENDENT_AMBULATORY_CARE_PROVIDER_SITE_OTHER): Payer: 59 | Admitting: Internal Medicine

## 2016-09-07 ENCOUNTER — Encounter: Payer: Self-pay | Admitting: Internal Medicine

## 2016-09-07 VITALS — BP 124/82 | HR 86 | Ht 66.0 in | Wt 158.0 lb

## 2016-09-07 DIAGNOSIS — E1165 Type 2 diabetes mellitus with hyperglycemia: Secondary | ICD-10-CM | POA: Diagnosis not present

## 2016-09-07 LAB — POCT GLYCOSYLATED HEMOGLOBIN (HGB A1C): HEMOGLOBIN A1C: 6.4

## 2016-09-07 MED ORDER — FREESTYLE LIBRE SENSOR SYSTEM MISC: 1.0000 | 11 refills | Status: DC

## 2016-09-07 MED ORDER — FREESTYLE LIBRE READER DEVI: 1.0000 | Freq: Three times a day (TID) | 1 refills | Status: DC

## 2016-09-07 NOTE — Addendum Note (Signed)
Addended by: Caprice Beaver T on: 09/07/2016 10:56 AM   Modules accepted: Orders

## 2016-09-07 NOTE — Progress Notes (Signed)
Patient ID: [name] Young, female   DOB: 1971/03/30, 46 y.o.   MRN: [id number]  HPI: [name] Young is a 46 y.o.-year-old female, returning for f/u for DM2, dx in 2008 (GDM 1998), non-insulin-dependent, controlled, without complications. Last visit 6 mo ago.  She had the flu in January >> missed last appt.   She started [employer] at work.  DM2: Last hemoglobin A1c was: Lab Results  Component Value Date   HGBA1C 6.6 (H) 03/16/2016   HGBA1C 6.5 03/16/2016   HGBA1C 6.5 12/13/2015  Since she was dx with DM >> she lost 50 lbs and was able to come off Lantus.  Pt is on a regimen of: - Metformin 1000 mg 2x a day - Invokana 100 mg - started 09/2014 - Januvia 100 mg daily - started 12/2014 - Regular Glipizide 2.5 mg 2x a day  Pt checks her sugars 1-2x a day and they are: - am: 91, 102-165 (230 - forgot meds) >> 90-135, 145 >> 81-137, 160, 211 >> 70-132, 146, 152 - 2h after b'fast: 176-268 >> 89 >> 305 >> n/c >> 185 >> 190 >> n/c - before lunch:  83, 105 >> 141 >> 60, 92-113 >> 120 >> n/c - >2h after lunch: 156-243 >> 174  >> 159-209 >> n/c >> 158 >> n/c  - before dinner: 68-99 >> 107-130, 247 >> 107-114, 176 >> n/c >> 106-112 >> 90, 142 - 2h after dinner:  235-298 >> 88-136 >> 195 >> 153, 219 >> n/c - bedtime:  N/c >> 144, 157 >> n/c  - nighttime: n/c >> 68, 180 >> n/c >> 58 (skipped dinner) >> n/c No lows. Lowest sugar was 81 >> ; she has hypoglycemia awareness at 60.  Highest sugar was 211 >> .  Glucometer: Bayer  Pt's meals are: - Breakfast: eggs, [place] bacon; green smoothies - Lunch: soup, chicken salad - Dinner: zucchini + chicken + rice - Snacks: Smoothies, jelly belly  - no CKD, last BUN/creatinine:  Lab Results  Component Value Date   BUN 19 03/16/2016   CREATININE 0.73 03/16/2016   Prev. on Lisinopril >> stopped >> ACR normal after stopping: Lab Results  Component Value Date   MICRALBCREAT 0.7 03/16/2016   MICRALBCREAT 0.7 03/22/2015   MICRALBCREAT 0.7  10/27/2014   MICRALBCREAT 0.1 02/09/2014   MICRALBCREAT 0.1 01/28/2013   MICRALBCREAT 0.3 06/07/2011   MICRALBCREAT 0.2 11/29/2010   MICRALBCREAT 4.6 10/21/2009   MICRALBCREAT 11.2 09/18/2006   - last set of lipids: Lab Results  Component Value Date   CHOL 150 03/16/2016   HDL 61.70 03/16/2016   LDLCALC 63 03/16/2016   LDLDIRECT 62.0 03/22/2015   TRIG 130.0 03/16/2016   CHOLHDL 2 03/16/2016  On Zocor. - last eye exam was in 01/2016. No DR.  - + numbness and tingling in her foot  Vit B12 def. - normalized at last check, on 1000 mcg daily now + the amount in B-complex Feels better >> more energy, less hair loss.  Latest level is from last visit >> great: Lab Results  Component Value Date   VITAMINB12 969 (H) 03/16/2016   VITAMINB12 503 09/13/2015   VITAMINB12 923 (H) 03/12/2015   VITAMINB12 220 12/10/2014   ROS: Constitutional: no weight gain, no fatigue, no subjective hyperthermia/hypothermia Eyes: no blurry vision, no xerophthalmia ENT: no sore throat, no nodules palpated in throat, no dysphagia/odynophagia, no hoarseness Cardiovascular: no CP/SOB/palpitations/leg swelling Respiratory: no cough/SOB Gastrointestinal: no N/V/D/C Musculoskeletal: no muscle/joint aches Skin: no rashes Neurological: no  tremors/numbness/tingling/dizziness  I reviewed pt's medications, allergies, PMH, social hx, family hx, and changes were documented in the history of present illness. Otherwise, unchanged from my initial visit note.  Past Medical History:  Diagnosis Date  . Diabetes mellitus    Type II  . Family history of breast cancer   . Family history of breast cancer    No past surgical history on file. History   Social History  . Marital Status: Married    Spouse Name: N/A  . 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 Smoking status: Never Smoker   . Smokeless tobacco: Not on file  . Alcohol Use: Socially; liquor  . Drug  Use: No   Social History Narrative   Regular exercise - 2-3x a week   Current Outpatient Prescriptions on File Prior to Visit  Medication Sig Dispense Refill  . ACCU-CHEK FASTCLIX LANCETS MISC USE AS DIRECTED 102 each 5  . ACCU-CHEK SMARTVIEW test strip USE TO TEST AS DIRECTED 100 each 5  . acyclovir (ZOVIRAX) 400 MG tablet TAKE 2 TABLETS BY MOUTH 2 TIMES DAILY. 50 tablet 3  . B Complex-C (SUPER B COMPLEX PO) Take 1 tablet by mouth daily.    . cholecalciferol (VITAMIN D) 1000 UNITS tablet Take 1,000 Units by mouth daily.    . fluticasone (FLONASE) 50 MCG/ACT nasal spray INSTILL 1 SPRAY IN EACH NOSTRIL AT BEDTIME 16 g 6  . glipiZIDE (GLUCOTROL) 10 MG tablet Take 0.5 tablets (5 mg total) by mouth 2 (two) times daily before a meal. 60 tablet 3  . INVOKANA 100 MG TABS tablet TAKE 1 TABLET BY MOUTH ONCE DAILY 30 tablet PRN  . JANUVIA 100 MG tablet TAKE 1 TABLET BY MOUTH ONCE DAILY 30 tablet 5  . metFORMIN (GLUCOPHAGE) 1000 MG tablet TAKE 1 TABLET BY MOUTH 2 TIMES DAILY WITH A MEAL. 200 tablet 4  . Multiple Vitamins-Minerals (DIASENSE MULTIVITAMIN) TABS Take 1 tablet by mouth daily.      . norethindrone-ethinyl estradiol 1/35 (DASETTA 1/35) tablet Take 1 tablet by mouth daily. 1 Package 11  . simvastatin (ZOCOR) 20 MG tablet Take 1 tablet (20 mg total) by mouth at bedtime. 90 tablet 1  . vitamin B-12 (CYANOCOBALAMIN) 1000 MCG tablet Take 1,000 mcg by mouth daily.     . vitamin C (ASCORBIC ACID) 500 MG tablet Take 500 mg by mouth daily.     No current facility-administered medications on file prior to visit.    Allergies  Allergen Reactions  . Penicillins     REACTION: RASH   Family History  Problem Relation Age of Onset  . Diabetes Other     Familly Hx First degree relative  . Breast cancer Mother 37  . Breast cancer Maternal Aunt 64  . Brain cancer Maternal Uncle 58   PE: BP 124/82 (BP Location: Left Arm, Patient Position: Sitting)   Pulse 86   Ht 5\' 6"  (1.676 m)   Wt 158 lb  (71.7 kg)   LMP 08/14/2016   SpO2 98%   BMI 25.50 kg/m  Body mass index is 25.5 kg/m. Wt Readings from Last 3 Encounters:  09/07/16 158 lb (71.7 kg)  04/03/16 159 lb (72.1 kg)  03/16/16 157 lb (71.2 kg)   Constitutional: normal weight, in NAD Eyes: PERRLA, EOMI, no exophthalmos ENT: moist mucous membranes, no thyromegaly, no cervical lymphadenopathy Cardiovascular: RRR, No MRG Respiratory: CTA B Gastrointestinal: abdomen soft, NT, ND, BS+  Musculoskeletal: no deformities, strength intact in all 4 Skin: moist, warm, no rashes Neurological: no tremor with outstretched hands, DTR normal in all 4  ASSESSMENT: 1. DM2, non-insulin-dependent, controlled, without lon term complications, but with occasional hyperglycemia  2. B12 deficiency  PLAN:  1. Patient with long-standing, fairly well controlled diabetes, on oral antidiabetic regimen. She has had few CBG spikes after dietary  indulgences but no more lows. Overall, sugars are better than at last visit. - I suggested a FreeStyle Libre CGM >> demonstrated use >> she agrees >> sent to pharmacy - I suggested to:  Patient Instructions  Please continue: - Metformin 2000 mg at dinner  - Invokana 100 mg daily - Januvia 100 mcg daily  - Regular Glipizide 2.5 mg 2x a day  Please come back for a follow-up appointment in 4 months.  Please stop at the lab.  - continue checking sugars at different times of the day - check 1x times a day, rotating checks - advised for yearly eye exams >> she is UTD - will check a HbA1c today >> 6.4% (improved!) - Return to clinic in 4 mo with sugar log   2. B12 deficiency - started 2000 mcg >> decreased to 1000 mcg daily >> rechecked level was normal at last visit - will recheck at next visit  Philemon Kingdom, MD PhD [facility] Endocrinology

## 2016-09-07 NOTE — Patient Instructions (Addendum)
Please continue: - Metformin 2000 mg at dinner  - Invokana 100 mg daily - Januvia 100 mcg daily  - Regular Glipizide 2.5 mg 2x a day  Please come back for a follow-up appointment in 4 months.

## 2016-09-11 ENCOUNTER — Telehealth: Payer: Self-pay

## 2016-09-11 MED ORDER — ACCU-CHEK GUIDE W/DEVICE KIT: 1.0000 | PACK | Freq: Every day | 0 refills | Status: DC

## 2016-09-11 MED ORDER — ACCU-CHEK FASTCLIX LANCETS MISC: 5 refills | Status: DC

## 2016-09-11 NOTE — Telephone Encounter (Signed)
rx submitted from pharmacy fax.

## 2016-09-13 ENCOUNTER — Other Ambulatory Visit: Payer: Self-pay

## 2016-09-13 MED ORDER — GLUCOSE BLOOD VI STRP: ORAL_STRIP | 5 refills | Status: DC

## 2016-09-13 MED ORDER — ACCU-CHEK FASTCLIX LANCETS MISC: 5 refills | Status: DC

## 2016-09-13 MED FILL — ACCU-CHEK FASTCLIX LANCETS: 90 days supply | Qty: 100 | Fill #0

## 2016-09-13 MED FILL — ACCU-CHEK GUIDE TEST STRIP: 90 days supply | Qty: 100 | Fill #0

## 2016-09-18 MED FILL — INVOKANA 100 MG TABLET: 100 | 90 days supply | Qty: 90 | Fill #5

## 2016-10-11 MED FILL — glipiZIDE 10 MG TABS: 10 | 60 days supply | Qty: 60 | Fill #2

## 2016-10-18 MED FILL — FLUTICASONE PROP 50 MCG SPR: 50 | 60 days supply | Qty: 16 | Fill #2

## 2016-10-30 MED FILL — JANUVIA 100 MG TABLET: 100 | 90 days supply | Qty: 90 | Fill #1

## 2016-10-30 MED FILL — metFORMIN HCL 1000 MG TABS: 1000 | 90 days supply | Qty: 180 | Fill #2

## 2016-11-01 ENCOUNTER — Telehealth: Payer: Self-pay | Admitting: Family Medicine

## 2016-11-01 ENCOUNTER — Encounter: Payer: Self-pay | Admitting: Family Medicine

## 2016-11-01 DIAGNOSIS — L237 Allergic contact dermatitis due to plants, except food: Secondary | ICD-10-CM

## 2016-11-01 NOTE — Telephone Encounter (Signed)
Relation to [id number] Call back [place], [place].  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

## 2016-11-01 NOTE — Telephone Encounter (Signed)
Called her back- she got into some PI gardening over the weekend.  It is just on one arm and is not intolerable.  She is not sure if she should use prednisone. Given her DM decided to hold off for now but will consider starting prednisone if needed for sx   Lab Results  Component Value Date   HGBA1C 6.4 09/07/2016

## 2016-11-05 DIAGNOSIS — L255 Unspecified contact dermatitis due to plants, except food: Secondary | ICD-10-CM | POA: Diagnosis not present

## 2016-11-05 MED ORDER — PREDNISONE 20 MG PO TABS: ORAL_TABLET | ORAL | 0 refills | Status: DC

## 2016-11-05 NOTE — Addendum Note (Signed)
Addended by: Lamar Blinks C on: 11/05/2016 08:35 PM   Modules accepted: Orders

## 2016-11-20 MED FILL — DASETTA 1-35-28 TABLET: 1-35 | 84 days supply | Qty: 84 | Fill #2

## 2016-12-05 ENCOUNTER — Other Ambulatory Visit: Payer: Self-pay | Admitting: Family Medicine

## 2016-12-06 ENCOUNTER — Other Ambulatory Visit: Payer: Self-pay | Admitting: Emergency Medicine

## 2016-12-06 MED ORDER — SIMVASTATIN 20 MG PO TABS: 20.0000 mg | ORAL_TABLET | Freq: Every day | ORAL | 0 refills | Status: DC

## 2016-12-06 MED FILL — SIMVASTATIN 20 MG TABLET: 20 | 30 days supply | Qty: 30 | Fill #0

## 2016-12-11 ENCOUNTER — Other Ambulatory Visit: Payer: Self-pay | Admitting: Family Medicine

## 2016-12-11 DIAGNOSIS — E119 Type 2 diabetes mellitus without complications: Secondary | ICD-10-CM

## 2016-12-11 DIAGNOSIS — E1165 Type 2 diabetes mellitus with hyperglycemia: Principal | ICD-10-CM

## 2016-12-11 DIAGNOSIS — IMO0001 Reserved for inherently not codable concepts without codable children: Secondary | ICD-10-CM

## 2016-12-11 MED FILL — FLUTICASONE PROP 50 MCG SPR: 50 | 60 days supply | Qty: 16 | Fill #3

## 2016-12-13 MED FILL — glipiZIDE 10 MG TABS: 10 | 30 days supply | Qty: 30 | Fill #1

## 2016-12-18 ENCOUNTER — Encounter: Payer: 59 | Admitting: Family Medicine

## 2016-12-27 MED FILL — INVOKANA 100 MG TABLET: 100 | 90 days supply | Qty: 90 | Fill #6

## 2017-01-02 DIAGNOSIS — H5213 Myopia, bilateral: Secondary | ICD-10-CM | POA: Diagnosis not present

## 2017-01-05 ENCOUNTER — Other Ambulatory Visit: Payer: Self-pay | Admitting: Emergency Medicine

## 2017-01-05 MED ORDER — SIMVASTATIN 20 MG PO TABS: 20.0000 mg | ORAL_TABLET | Freq: Every day | ORAL | 2 refills | Status: DC

## 2017-01-05 MED FILL — SIMVASTATIN 20 MG TABLET: 20 | 30 days supply | Qty: 30 | Fill #0

## 2017-01-09 MED FILL — glipiZIDE 10 MG TABS: 10 | 90 days supply | Qty: 90 | Fill #0

## 2017-01-09 MED FILL — ACCU-CHEK GUIDE TEST STRIP: 90 days supply | Qty: 100 | Fill #1

## 2017-01-15 ENCOUNTER — Other Ambulatory Visit: Payer: Self-pay | Admitting: Family Medicine

## 2017-01-15 DIAGNOSIS — Z1231 Encounter for screening mammogram for malignant neoplasm of breast: Secondary | ICD-10-CM

## 2017-01-19 ENCOUNTER — Ambulatory Visit
Admission: RE | Admit: 2017-01-19 | Discharge: 2017-01-19 | Disposition: A | Discharge status: Home / Self Care | Payer: 59 | Source: Ambulatory Visit | Attending: Family Medicine | Admitting: Family Medicine

## 2017-01-19 ENCOUNTER — Encounter: Payer: Self-pay | Admitting: Internal Medicine

## 2017-01-19 ENCOUNTER — Ambulatory Visit (INDEPENDENT_AMBULATORY_CARE_PROVIDER_SITE_OTHER): Payer: 59 | Admitting: Internal Medicine

## 2017-01-19 VITALS — BP 122/80 | HR 89 | Ht 66.0 in | Wt 165.0 lb

## 2017-01-19 DIAGNOSIS — E1165 Type 2 diabetes mellitus with hyperglycemia: Secondary | ICD-10-CM

## 2017-01-19 DIAGNOSIS — Z1231 Encounter for screening mammogram for malignant neoplasm of breast: Secondary | ICD-10-CM

## 2017-01-19 DIAGNOSIS — E538 Deficiency of other specified B group vitamins: Secondary | ICD-10-CM | POA: Diagnosis not present

## 2017-01-19 LAB — POCT GLYCOSYLATED HEMOGLOBIN (HGB A1C): HEMOGLOBIN A1C: 6.7

## 2017-01-19 NOTE — Patient Instructions (Addendum)
Please continue: - Metformin 2000 mg with dinner  - Invokana 100 mg daily - Januvia 100 mcg daily  - Regular Glipizide 2.5 mg 2x a day  Please come back for a follow-up appointment in 3-4 months.  Please stop at the lab.

## 2017-01-19 NOTE — Progress Notes (Signed)
Patient ID: [name] Young, female   DOB: Sep 26, 1970, 46 y.o.   MRN: [id number]  HPI: [name] Young is a 46 y.o.-year-old female, returning for f/u for DM2, dx in 2008 (GDM 1998), non-insulin-dependent, controlled, without long-term complications. Last visit 4 mo ago.  Since last visit, she increased the amount of fast food that she is eating at night and she started to gain weight and have more uncontrolled sugars. She also had some lows while at the beach after drinking alcohol and not eating dinner.  She is on the well [employer] at work.  DM2: Last hemoglobin A1c was: Lab Results  Component Value Date   HGBA1C 6.4 09/07/2016   HGBA1C 6.6 (H) 03/16/2016   HGBA1C 6.5 03/16/2016  Since she was dx with DM >> she lost 50 lbs and was able to come off Lantus.  Pt is on a regimen of: - Metformin 1000 mg 2x a day - Invokana 100 mg - started 09/2014 - Januvia 100 mg daily - started 12/2014 - Regular Glipizide 2.5 mg 2x a day  Pt checks her sugars 1-2x a day and they are: - am:  90-135, 145 >> 81-137, 160, 211 >> 70-132, 146, 152 >> 49, 58, 89-146, 190 - 2h after b'fast: 176-268 >> 89 >> 305 >> n/c >> 185 >> 190 >> n/c - before lunch:  83, 105 >> 141 >> 60, 92-113 >> 120 >> n/c - >2h after lunch: 156-243 >> 174  >> 159-209 >> n/c >> 158 >> n/c  - before dinner: 107-130, 247 >> 107-114, 176 >> n/c >> 106-112 >> 90, 142 >> 167, 233 - 2h after dinner:  235-298 >> 88-136 >> 195 >> 153, 219 >> n/c >> 124 - bedtime:  N/c >> 144, 157 >> n/c  - nighttime: n/c >> 68, 180 >> n/c >> 58 (skipped dinner) >> n/c >> 118 No lows. Lowest sugar was 81 >> 49 (alcohol); she has hypoglycemia awareness at 60.  Highest sugar was 211 >> 190.  Glucometer: Bayer  Pt's meals are: - Breakfast: Eggs and cheese + grits or omelette - Lunch: Salad or veggies or tuna salad with saltines or chicken tenders - Dinner: Fast food - Snacks: Graham crackers  - No CKD, last BUN/creatinine:  Lab Results  Component  Value Date   BUN 19 03/16/2016   CREATININE 0.73 03/16/2016   Prev. on Lisinopril >> stopped >> ACR remained normal after stopping: Lab Results  Component Value Date   MICRALBCREAT 0.7 03/16/2016   MICRALBCREAT 0.7 03/22/2015   MICRALBCREAT 0.7 10/27/2014   MICRALBCREAT 0.1 02/09/2014   MICRALBCREAT 0.1 01/28/2013   MICRALBCREAT 0.3 06/07/2011   MICRALBCREAT 0.2 11/29/2010   MICRALBCREAT 4.6 10/21/2009   MICRALBCREAT 11.2 09/18/2006   - last set of lipids: Lab Results  Component Value Date   CHOL 150 03/16/2016   HDL 61.70 03/16/2016   LDLCALC 63 03/16/2016   LDLDIRECT 62.0 03/22/2015   TRIG 130.0 03/16/2016   CHOLHDL 2 03/16/2016  On Zocor. - last eye exam was in 01/2016: No DR.  - She does have numbness and tingling in her foot  Vit B12 def. - Currently on t1000 mcg B12 daily + B complex   Reviewed previous levels: Lab Results  Component Value Date   VITAMINB12 969 (H) 03/16/2016   VITAMINB12 503 09/13/2015   VITAMINB12 923 (H) 03/12/2015   VITAMINB12 220 12/10/2014   ROS: Constitutional: + weight gain, no fatigue, no subjective hyperthermia, no subjective hypothermia Eyes: no  blurry vision, no xerophthalmia ENT: no sore throat, no nodules palpated in throat, no dysphagia, no odynophagia, no hoarseness Cardiovascular: no CP/no SOB/no palpitations/no leg swelling Respiratory: no cough/no SOB/no wheezing Gastrointestinal: no N/no V/no D/no C/no acid reflux Musculoskeletal: no muscle aches/no joint aches Skin: no rashes, no hair loss Neurological: no tremors/no numbness/no tingling/no dizziness  I reviewed pt's medications, allergies, PMH, social hx, family hx, and changes were documented in the history of present illness. Otherwise, unchanged from my initial visit note.  Past Medical History:  Diagnosis Date  . Diabetes mellitus    Type II  . Family history of breast cancer   . Family history of breast cancer    No past surgical history on file. History    Social History  . Marital Status: Married    Spouse Name: N/A  . 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 Smoking status: Never Smoker   . Smokeless tobacco: Not on file  . Alcohol Use: Socially; liquor  . Drug Use: No   Social History Narrative   Regular exercise - 2-3x a week   Current Outpatient Prescriptions on File Prior to Visit  Medication Sig Dispense Refill  . ACCU-CHEK FASTCLIX LANCETS MISC Use to check sugars daily 200 each 5  . acyclovir (ZOVIRAX) 400 MG tablet TAKE 2 TABLETS BY MOUTH 2 TIMES DAILY. 50 tablet 3  . B Complex-C (SUPER B COMPLEX PO) Take 1 tablet by mouth daily.    . Blood Glucose Monitoring Suppl (ACCU-CHEK GUIDE) w/Device KIT 1 each by Does not apply route daily. 1 kit 0  . cholecalciferol (VITAMIN D) 1000 UNITS tablet Take 1,000 Units by mouth daily.    . Continuous Blood Gluc Receiver (FREESTYLE LIBRE READER) DEVI 1 Device by Does not apply route 3 (three) times daily. 1 Device 1  . Continuous Blood Gluc Sensor (FREESTYLE LIBRE SENSOR SYSTEM) MISC 1 Device by Does not apply route every 30 (thirty) days. 3 each 11  . fluticasone (FLONASE) 50 MCG/ACT nasal spray INSTILL 1 SPRAY IN EACH NOSTRIL AT BEDTIME 16 g 6  . glipiZIDE (GLUCOTROL) 10 MG tablet TAKE 1/2 TABLET BY MOUTH 2 TIMES DAILY BEFORE A MEAL. 60 tablet 3  . glucose blood (ACCU-CHEK GUIDE) test strip Use as instructed to check sugar daily 100 each 5  . INVOKANA 100 MG TABS tablet TAKE 1 TABLET BY MOUTH ONCE DAILY 30 tablet PRN  . JANUVIA 100 MG tablet TAKE 1 TABLET BY MOUTH ONCE DAILY 30 tablet 5  . metFORMIN (GLUCOPHAGE) 1000 MG tablet TAKE 1 TABLET BY MOUTH 2 TIMES DAILY WITH A MEAL. 200 tablet 4  . Multiple Vitamins-Minerals (DIASENSE MULTIVITAMIN) TABS Take 1 tablet by mouth daily.      . norethindrone-ethinyl estradiol 1/35 (DASETTA 1/35) tablet Take 1 tablet by mouth daily. 1 Package 11  . predniSONE (DELTASONE) 20 MG tablet Take 2 pills a  day for 5 days, then 1 pill a day for 5 days 15 tablet 0  . simvastatin (ZOCOR) 20 MG tablet Take 1 tablet (20 mg total) by mouth at bedtime. 30 tablet 2  . vitamin B-12 (CYANOCOBALAMIN) 1000 MCG tablet Take 1,000 mcg by mouth daily.     . vitamin C (ASCORBIC ACID) 500 MG tablet Take 500 mg by mouth daily.     No current facility-administered medications on file prior to visit.    Allergies  Allergen Reactions  . Penicillins  REACTION: RASH   Family History  Problem Relation Age of Onset  . Diabetes Other        Familly Hx First degree relative  . Breast cancer Mother 42  . Breast cancer Maternal Aunt 64  . Brain cancer Maternal Uncle 58   PE: BP 122/80   Pulse 89   Ht '5\' 6"'  (1.676 m)   Wt 165 lb (74.8 kg)   SpO2 97%   BMI 26.63 kg/m  Body mass index is 26.63 kg/m. Wt Readings from Last 3 Encounters:  01/19/17 165 lb (74.8 kg)  09/07/16 158 lb (71.7 kg)  04/03/16 159 lb (72.1 kg)   Constitutional: Slightly overweight, in NAD Eyes: PERRLA, EOMI, no exophthalmos ENT: moist mucous membranes, no thyromegaly, no cervical lymphadenopathy Cardiovascular: RRR, No MRG Respiratory: CTA B Gastrointestinal: abdomen soft, NT, ND, BS+ Musculoskeletal: no deformities, strength intact in all 4 Skin: moist, warm, no rashes Neurological: no tremor with outstretched hands, DTR normal in all 4  ASSESSMENT: 1. DM2, non-insulin-dependent, controlled, without lon term complications, But with hyperglycemia hyperglycemia  2. B12 deficiency  PLAN:  1. Patient with long-standing, fairly well controlled diabetes, on oral antidiabetic regimen. Her sugars deteriorated after she started to eat more fast food, especially at night. She reaches home late and goes to bed after she eats. - We discussed at length about the need to cut out the fast food and I made some suggestions about meal planning - I also suggested that she watches the documentary "Supersize me" - Otherwise, no need to change  the regimen for now  - We did review her low blood sugars while at the beach, after she had alcohol, and I suggested not to skip meals whenever she drinks alcohol - I suggested to:  Patient Instructions  Please continue: - Metformin 2000 mg with dinner  - Invokana 100 mg daily - Januvia 100 mcg daily  - Regular Glipizide 2.5 mg 2x a day  Please come back for a follow-up appointment in 3-4 months.  Please stop at the lab.  - today, HbA1c is 6.7% (higher) - continue checking sugars at different times of the day - check 1x a day, rotating checks - advised for yearly eye exams >> she is due - She has an upcoming appointment with PCP, will have annual labs then - Return to clinic in 3-4 mo with sugar log   2. B12 deficiency - Currently on 1000 g daily - Latest level reviewed, and this was slightly high in 03/2016 - Recheck at the next visit with her PCP, along with the annual labs  Philemon Kingdom, MD PhD [facility] Endocrinology

## 2017-01-22 NOTE — Progress Notes (Addendum)
[place] [facility] [place], [place], [place] [address] [phone]  Date:  01/24/2017   Name:  [name] Young   DOB:  08/26/1970   MRN:  [id number]  PCP:  Darreld Mclean, MD    Chief Complaint: Annual Exam (Pt here for CPE. :Last PAP: 03/2016. )   History of Present Illness:  [name] Young is a 46 y.o. very pleasant female patient who presents with the following:  Here today for a CPE- last visit here in September of 2017  Here today as a new patient to myself but not to [employer]. Her PCP Dr. Sherren Mocha is retiring soon History of DM- she sees Dr. Cruzita Lederer for this and is under good control.  She is trying to exercise more and is watching her diet.   She had labs earlier this month- cholesterol is at goal, negative urine protein She is due for eye exam, and for a tetanus shot.  Flu shot today She would like a flu shot and tetanus shot today- she needs tdap mammo is UTD- she just had an [place] for a palpable mass that was negative Her mother was dx with breast cancer at age 3- she died at 51 of complications. Her maternal aunt was also recently dx with breast cancer She would like to consider having genetic testing for this, mostly for her daughter's sake.    Noted pap from 2015 showed negative cytology but positive HRHPV.   Does not specify type 16 or 18.  She has never had an abnl pap in the past She is menstruating today- she is still on OCP  She has also noted some GERD sx off an on, over the last year or so.  She did take 1 dose of protonix today but otherwise has just used PRN tums No nausea or vomiting, she is able to eat well No fever or chills No rash- she does have a history of dysplastic moles but has not noted any concerning skin lesions recently. No history of skin cancer  Lab Results  Component Value Date   HGBA1C 6.7 01/19/2017   She sees Dr. Cruzita Lederer for her DM- just seen last week. Her A1c was a bit  higher and they discussed ways to combat this.  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  Sometimes when she is working she will develop constipation and uses a stool softener as needed   Patient Active Problem List   Diagnosis Date Noted  . Genetic testing 06/26/2016  . Family history of breast cancer   . Type 2 diabetes mellitus with hyperglycemia ([place]) 09/13/2015  . Nonrheumatic mitral valve prolapse 03/29/2015  . Vitamin B12 deficiency 03/12/2015  . Dysplastic nevus of trunk 03/05/2012  . HSV (herpes simplex virus) infection 02/06/2012  . DUB (dysfunctional uterine bleeding) 02/06/2012  . Dysplastic nevus of lower extremity, right 12/12/2011  . SHOULDER PAIN, RIGHT 05/17/2009  . Hyperlipidemia 10/23/2008  .  ALLERGIC REACTION 07/10/2007    Past Medical History:  Diagnosis Date  . Diabetes mellitus    Type II  . Family history of breast cancer   . Family history of breast cancer     No past surgical history on file.  Social History  Substance Use Topics  . Smoking status: Never Smoker  . Smokeless tobacco: Never Used  . Alcohol use No    Family History  Problem Relation Age of Onset  . Diabetes Other        Familly Hx First degree relative  . Breast cancer Mother 36  . Breast cancer Maternal Aunt 64  . Brain cancer Maternal Uncle 58    Allergies  Allergen Reactions  . Penicillins     REACTION: RASH    Medication list has been reviewed and updated.  Current Outpatient Prescriptions on File Prior to Visit  Medication Sig Dispense Refill  .  ACCU-CHEK FASTCLIX LANCETS MISC Use to check sugars daily 200 each 5  . acyclovir (ZOVIRAX) 400 MG tablet TAKE 2 TABLETS BY MOUTH 2 TIMES DAILY. 50 tablet 3  . B Complex-C (SUPER B COMPLEX PO) Take 1 tablet by mouth daily.    . Blood Glucose Monitoring Suppl (ACCU-CHEK GUIDE) w/Device KIT 1 each by Does not apply route daily. 1 kit 0  . cholecalciferol (VITAMIN D) 1000 UNITS tablet Take 1,000 Units by mouth daily.    . Continuous Blood Gluc Receiver (FREESTYLE LIBRE READER) DEVI 1 Device by Does not apply route 3 (three) times daily. 1 Device 1  . Continuous Blood Gluc Sensor (FREESTYLE LIBRE SENSOR SYSTEM) MISC 1 Device by Does not apply route every 30 (thirty) days. 3 each 11  . fluticasone (FLONASE) 50 MCG/ACT nasal spray INSTILL 1 SPRAY IN EACH NOSTRIL AT BEDTIME 16 g 6  . glipiZIDE (GLUCOTROL) 10 MG tablet TAKE 1/2 TABLET BY MOUTH 2 TIMES DAILY BEFORE A MEAL. 60 tablet 3  . glucose blood (ACCU-CHEK GUIDE) test strip Use as instructed to check sugar daily 100 each 5  . INVOKANA 100 MG TABS tablet TAKE 1 TABLET BY MOUTH ONCE DAILY 30 tablet PRN  . JANUVIA 100 MG tablet TAKE 1 TABLET BY MOUTH ONCE DAILY 30 tablet 5  . metFORMIN (GLUCOPHAGE) 1000 MG tablet TAKE 1 TABLET BY MOUTH 2 TIMES DAILY WITH A MEAL. 200 tablet 4  . Multiple Vitamins-Minerals (DIASENSE MULTIVITAMIN) TABS Take 1 tablet by mouth daily.      . norethindrone-ethinyl estradiol 1/35 (DASETTA 1/35) tablet Take 1 tablet by mouth daily. 1 Package 11  . Omeprazole (PRILOSEC PO) Take by mouth.    . predniSONE (DELTASONE) 20 MG tablet Take 2 pills a day for 5 days, then 1 pill a day for 5 days 15 tablet 0  . simvastatin (ZOCOR) 20 MG tablet Take 1 tablet (20 mg total) by mouth at bedtime. 30 tablet 2  . vitamin B-12 (CYANOCOBALAMIN) 1000 MCG tablet Take 1,000 mcg by mouth daily.     . vitamin C (ASCORBIC ACID) 500 MG tablet Take 500 mg by mouth daily.     No current facility-administered medications on file prior to visit.      Review of Systems:  As per HPI- otherwise negative.   Physical Examination: Vitals:   01/24/17 0933  BP: 126/82  Pulse: 92  Temp: 98.3 F (36.8 C)   Vitals:   01/24/17 0933  Weight: 165 lb (74.8 kg)  Height: '5\' 6"'  (1.676 m)   Body mass index is 26.63  kg/m. Ideal Body Weight: Weight in (lb) to have BMI = 25: 154.6  GEN: WDWN, NAD, Non-toxic, A & O x 3, looks well, overweight HEENT: Atraumatic, Normocephalic. Neck supple. No masses, No LAD.  Bilateral TM wnl, oropharynx normal.  PEERL,EOMI.   Ears and Nose: No external deformity. CV: RRR, No M/G/R. No JVD. No thrill. No extra heart sounds. PULM: CTA B, no wheezes, crackles, rhonchi. No retractions. No resp. distress. No accessory muscle use. ABD: S, NT, ND. No rebound. No HSM. EXTR: No c/c/e NEURO Normal gait.  PSYCH: Normally interactive. Conversant. Not depressed or anxious appearing.  Calm demeanor.  Foot exam normal today   Assessment and Plan: Physical exam  Type 2 diabetes mellitus without complication, without long-term current use of insulin (HCC)  Mixed hyperlipidemia - Plan: Lipid panel, simvastatin (ZOCOR) 20 MG tablet  Gastroesophageal reflux disease, esophagitis presence not specified  Family history of breast cancer  Medication monitoring encounter - Plan: CBC, Comprehensive metabolic panel  O27 deficiency - Plan: B12  Here today for a CPE Negative pap and HPV last year- will plan to repeat her pap in 1-2 years  Labs pending asabove She is an endocrinology pt and her most recent A1c was acceptable  Signed Lamar Blinks, MD  Received her recent labs, message to pt  Results for orders placed or performed in visit on 01/24/17  Lipid panel  Result Value Ref Range   Cholesterol 163 0 - 200 mg/dL   Triglycerides 167.0 (H) 0.0 - 149.0 mg/dL   HDL 66.50 >39.00 mg/dL   VLDL 33.4 0.0 - 40.0 mg/dL   LDL Cholesterol 63 0 - 99 mg/dL   Total CHOL/HDL Ratio 2    NonHDL 96.03   B12  Result  Value Ref Range   Vitamin B-12 782 211 - 911 pg/mL  CBC  Result Value Ref Range   WBC 7.3 4.0 - 10.5 K/uL   RBC 4.41 3.87 - 5.11 Mil/uL   Platelets 247.0 150.0 - 400.0 K/uL   Hemoglobin 13.6 12.0 - 15.0 g/dL   HCT 40.8 36.0 - 46.0 %   MCV 92.7 78.0 - 100.0 fl   MCHC 33.3 30.0 - 36.0 g/dL   RDW 13.3 11.5 - 15.5 %  Comprehensive metabolic panel  Result Value Ref Range   Sodium 137 135 - 145 mEq/L   Potassium 3.6 3.5 - 5.1 mEq/L   Chloride 102 96 - 112 mEq/L   CO2 24 19 - 32 mEq/L   Glucose, Bld 133 (H) 70 - 99 mg/dL   BUN 11 6 - 23 mg/dL   Creatinine, Ser 0.77 0.40 - 1.20 mg/dL   Total Bilirubin 0.4 0.2 - 1.2 mg/dL   Alkaline Phosphatase 50 39 - 117 U/L   AST 20 0 - 37 U/L   ALT 23 0 - 35 U/L   Total Protein 7.0 6.0 - 8.3 g/dL   Albumin 4.2 3.5 - 5.2 g/dL   Calcium 9.4 8.4 - 10.5 mg/dL   GFR 84.85 >60.00 mL/min    Lab Results  Component Value Date   HGBA1C 6.7 01/19/2017

## 2017-01-24 ENCOUNTER — Ambulatory Visit (INDEPENDENT_AMBULATORY_CARE_PROVIDER_SITE_OTHER): Payer: 59 | Admitting: Family Medicine

## 2017-01-24 ENCOUNTER — Encounter: Payer: Self-pay | Admitting: Family Medicine

## 2017-01-24 VITALS — BP 126/82 | HR 92 | Temp 98.3°F | Ht 66.0 in | Wt 165.0 lb

## 2017-01-24 DIAGNOSIS — E119 Type 2 diabetes mellitus without complications: Secondary | ICD-10-CM | POA: Diagnosis not present

## 2017-01-24 DIAGNOSIS — E538 Deficiency of other specified B group vitamins: Secondary | ICD-10-CM | POA: Diagnosis not present

## 2017-01-24 DIAGNOSIS — Z803 Family history of malignant neoplasm of breast: Secondary | ICD-10-CM

## 2017-01-24 DIAGNOSIS — Z0000 Encounter for general adult medical examination without abnormal findings: Secondary | ICD-10-CM

## 2017-01-24 DIAGNOSIS — Z5181 Encounter for therapeutic drug level monitoring: Secondary | ICD-10-CM

## 2017-01-24 DIAGNOSIS — K219 Gastro-esophageal reflux disease without esophagitis: Secondary | ICD-10-CM

## 2017-01-24 DIAGNOSIS — E782 Mixed hyperlipidemia: Secondary | ICD-10-CM | POA: Diagnosis not present

## 2017-01-24 LAB — LIPID PANEL
CHOL/HDL RATIO: 2
Cholesterol: 163 mg/dL (ref 0–200)
HDL: 66.5 mg/dL (ref >39.00)
LDL CALC: 63 mg/dL (ref 0–99)
NONHDL: 96.03
Triglycerides: 167 mg/dL — ABNORMAL HIGH (ref 0.0–149.0)
VLDL: 33.4 mg/dL (ref 0.0–40.0)

## 2017-01-24 LAB — COMPREHENSIVE METABOLIC PANEL
ALT: 23 U/L (ref 0–35)
AST: 20 U/L (ref 0–37)
Albumin: 4.2 g/dL (ref 3.5–5.2)
Alkaline Phosphatase: 50 U/L (ref 39–117)
BILIRUBIN TOTAL: 0.4 mg/dL (ref 0.2–1.2)
BUN: 11 mg/dL (ref 6–23)
CALCIUM: 9.4 mg/dL (ref 8.4–10.5)
CHLORIDE: 102 meq/L (ref 96–112)
CO2: 24 meq/L (ref 19–32)
CREATININE: 0.77 mg/dL (ref 0.40–1.20)
GFR: 84.85 mL/min (ref >60.00)
GLUCOSE: 133 mg/dL — AB (ref 70–99)
Potassium: 3.6 mEq/L (ref 3.5–5.1)
SODIUM: 137 meq/L (ref 135–145)
Total Protein: 7 g/dL (ref 6.0–8.3)

## 2017-01-24 LAB — CBC
HCT: 40.8 % (ref 36.0–46.0)
Hemoglobin: 13.6 g/dL (ref 12.0–15.0)
MCHC: 33.3 g/dL (ref 30.0–36.0)
MCV: 92.7 fl (ref 78.0–100.0)
PLATELETS: 247 10*3/uL (ref 150.0–400.0)
RBC: 4.41 Mil/uL (ref 3.87–5.11)
RDW: 13.3 % (ref 11.5–15.5)
WBC: 7.3 10*3/uL (ref 4.0–10.5)

## 2017-01-24 LAB — VITAMIN B12: VITAMIN B 12: 782 pg/mL (ref 211–911)

## 2017-01-24 MED ORDER — SIMVASTATIN 20 MG PO TABS: 20.0000 mg | ORAL_TABLET | Freq: Every day | ORAL | 3 refills | Status: DC

## 2017-01-24 NOTE — Patient Instructions (Signed)
It was great to see you today- we can plan to visit for a physical in about one year.   Health Maintenance, Female Adopting a healthy lifestyle and getting preventive care can go a long way to promote health and wellness. Talk with your health care provider about what schedule of regular examinations is right for you. This is a good chance for you to check in with your provider about disease prevention and staying healthy. In between checkups, there are plenty of things you can do on your own. Experts have done a lot of research about which lifestyle changes and preventive measures are most likely to keep you healthy. Ask your health care provider for more information. Weight and diet Eat a healthy diet  Be sure to include plenty of vegetables, fruits, low-fat dairy products, and lean protein.  Do not eat a lot of foods high in solid fats, added sugars, or salt.  Get regular exercise. This is one of the most important things you can do for your health. ? Most adults should exercise for at least 150 minutes each week. The exercise should increase your heart rate and make you sweat (moderate-intensity exercise). ? Most adults should also do strengthening exercises at least twice a week. This is in addition to the moderate-intensity exercise.  Maintain a healthy weight  Body mass index (BMI) is a measurement that can be used to identify possible weight problems. It estimates body fat based on height and weight. Your health care provider can help determine your BMI and help you achieve or maintain a healthy weight.  For females 60 years of age and older: ? A BMI below 18.5 is considered underweight. ? A BMI of 18.5 to 24.9 is normal. ? A BMI of 25 to 29.9 is considered overweight. ? A BMI of 30 and above is considered obese.  Watch levels of cholesterol and blood lipids  You should start having your blood tested for lipids and cholesterol at 48 years of age, then have this test every 5  years.  You may need to have your cholesterol levels checked more often if: ? Your lipid or cholesterol levels are high. ? You are older than 48 years of age. ? You are at high risk for heart disease.  Cancer screening Lung Cancer  Lung cancer screening is recommended for adults 76-8 years old who are at high risk for lung cancer because of a history of smoking.  A yearly low-dose CT scan of the lungs is recommended for people who: ? Currently smoke. ? Have quit within the past 15 years. ? Have at least a 30-pack-year history of smoking. A pack year is smoking an average of one pack of cigarettes a day for 1 year.  Yearly screening should continue until it has been 15 years since you quit.  Yearly screening should stop if you develop a health problem that would prevent you from having lung cancer treatment.  Breast Cancer  Practice breast self-awareness. This means understanding how your breasts normally appear and feel.  It also means doing regular breast self-exams. Let your health care provider know about any changes, no matter how small.  If you are in your 20s or 30s, you should have a clinical breast exam (CBE) by a health care provider every 1-3 years as part of a regular health exam.  If you are 78 or older, have a CBE every year. Also consider having a breast X-ray (mammogram) every year.  If you have a  family history of breast cancer, talk to your health care provider about genetic screening.  If you are at high risk for breast cancer, talk to your health care provider about having an MRI and a mammogram every year.  Breast cancer gene (BRCA) assessment is recommended for women who have family members with BRCA-related cancers. BRCA-related cancers include: ? Breast. ? Ovarian. ? Tubal. ? Peritoneal cancers.  Results of the assessment will determine the need for genetic counseling and BRCA1 and BRCA2 testing.  Cervical Cancer Your health care provider may  recommend that you be screened regularly for cancer of the pelvic organs (ovaries, uterus, and vagina). This screening involves a pelvic examination, including checking for microscopic changes to the surface of your cervix (Pap test). You may be encouraged to have this screening done every 3 years, beginning at age 47.  For women ages 52-65, health care providers may recommend pelvic exams and Pap testing every 3 years, or they may recommend the Pap and pelvic exam, combined with testing for human papilloma virus (HPV), every 5 years. Some types of HPV increase your risk of cervical cancer. Testing for HPV may also be done on women of any age with unclear Pap test results.  Other health care providers may not recommend any screening for nonpregnant women who are considered low risk for pelvic cancer and who do not have symptoms. Ask your health care provider if a screening pelvic exam is right for you.  If you have had past treatment for cervical cancer or a condition that could lead to cancer, you need Pap tests and screening for cancer for at least 20 years after your treatment. If Pap tests have been discontinued, your risk factors (such as having a new sexual partner) need to be reassessed to determine if screening should resume. Some women have medical problems that increase the chance of getting cervical cancer. In these cases, your health care provider may recommend more frequent screening and Pap tests.  Colorectal Cancer  This type of cancer can be detected and often prevented.  Routine colorectal cancer screening usually begins at 48 years of age and continues through 47 years of age.  Your health care provider may recommend screening at an earlier age if you have risk factors for colon cancer.  Your health care provider may also recommend using home test kits to check for hidden blood in the stool.  A small camera at the end of a tube can be used to examine your colon directly  (sigmoidoscopy or colonoscopy). This is done to check for the earliest forms of colorectal cancer.  Routine screening usually begins at age 66.  Direct examination of the colon should be repeated every 5-10 years through 48 years of age. However, you may need to be screened more often if early forms of precancerous polyps or small growths are found.  Skin Cancer  Check your skin from head to toe regularly.  Tell your health care provider about any new moles or changes in moles, especially if there is a change in a mole's shape or color.  Also tell your health care provider if you have a mole that is larger than the size of a pencil eraser.  Always use sunscreen. Apply sunscreen liberally and repeatedly throughout the day.  Protect yourself by wearing long sleeves, pants, a wide-brimmed hat, and sunglasses whenever you are outside.  Heart disease, diabetes, and high blood pressure  High blood pressure causes heart disease and increases the risk of  stroke. High blood pressure is more likely to develop in: ? People who have blood pressure in the high end of the normal range (130-139/85-89 mm Hg). ? People who are overweight or obese. ? People who are African American.  If you are 32-32 years of age, have your blood pressure checked every 3-5 years. If you are 79 years of age or older, have your blood pressure checked every year. You should have your blood pressure measured twice-once when you are at a hospital or clinic, and once when you are not at a hospital or clinic. Record the average of the two measurements. To check your blood pressure when you are not at a hospital or clinic, you can use: ? An automated blood pressure machine at a pharmacy. ? A home blood pressure monitor.  If you are between 33 years and 15 years old, ask your health care provider if you should take aspirin to prevent strokes.  Have regular diabetes screenings. This involves taking a blood sample to check your  fasting blood sugar level. ? If you are at a normal weight and have a low risk for diabetes, have this test once every three years after 48 years of age. ? If you are overweight and have a high risk for diabetes, consider being tested at a younger age or more often. Preventing infection Hepatitis B  If you have a higher risk for hepatitis B, you should be screened for this virus. You are considered at high risk for hepatitis B if: ? You were born in a country where hepatitis B is common. Ask your health care provider which countries are considered high risk. ? Your parents were born in a high-risk country, and you have not been immunized against hepatitis B (hepatitis B vaccine). ? You have HIV or AIDS. ? You use needles to inject street drugs. ? You live with someone who has hepatitis B. ? You have had sex with someone who has hepatitis B. ? You get hemodialysis treatment. ? You take certain medicines for conditions, including cancer, organ transplantation, and autoimmune conditions.  Hepatitis C  Blood testing is recommended for: ? Everyone born from [birth year] through 1967. ? Anyone with known risk factors for hepatitis C.  Sexually transmitted infections (STIs)  You should be screened for sexually transmitted infections (STIs) including gonorrhea and chlamydia if: ? You are sexually active and are younger than 48 years of age. ? You are older than 48 years of age and your health care provider tells you that you are at risk for this type of infection. ? Your sexual activity has changed since you were last screened and you are at an increased risk for chlamydia or gonorrhea. Ask your health care provider if you are at risk.  If you do not have HIV, but are at risk, it may be recommended that you take a prescription medicine daily to prevent HIV infection. This is called pre-exposure prophylaxis (PrEP). You are considered at risk if: ? You are sexually active and do not regularly use condoms  or know the HIV status of your partner(s). ? You take drugs by injection. ? You are sexually active with a partner who has HIV.  Talk with your health care provider about whether you are at high risk of being infected with HIV. If you choose to begin PrEP, you should first be tested for HIV. You should then be tested every 3 months for as long as you are taking PrEP. Pregnancy  If  you are premenopausal and you may become pregnant, ask your health care provider about preconception counseling.  If you may become pregnant, take 400 to 800 micrograms (mcg) of folic acid every day.  If you want to prevent pregnancy, talk to your health care provider about birth control (contraception). Osteoporosis and menopause  Osteoporosis is a disease in which the bones lose minerals and strength with aging. This can result in serious bone fractures. Your risk for osteoporosis can be identified using a bone density scan.  If you are 46 years of age or older, or if you are at risk for osteoporosis and fractures, ask your health care provider if you should be screened.  Ask your health care provider whether you should take a calcium or vitamin D supplement to lower your risk for osteoporosis.  Menopause may have certain physical symptoms and risks.  Hormone replacement therapy may reduce some of these symptoms and risks. Talk to your health care provider about whether hormone replacement therapy is right for you. Follow these instructions at home:  Schedule regular health, dental, and eye exams.  Stay current with your immunizations.  Do not use any tobacco products including cigarettes, chewing tobacco, or electronic cigarettes.  If you are pregnant, do not drink alcohol.  If you are breastfeeding, limit how much and how often you drink alcohol.  Limit alcohol intake to no more than 1 drink per day for nonpregnant women. One drink equals 12 ounces of beer, 5 ounces of wine, or 1 ounces of hard  liquor.  Do not use street drugs.  Do not share needles.  Ask your health care provider for help if you need support or information about quitting drugs.  Tell your health care provider if you often feel depressed.  Tell your health care provider if you have ever been abused or do not feel safe at home. This information is not intended to replace advice given to you by your health care provider. Make sure you discuss any questions you have with your health care provider. Document Released: 01/09/2011 Document Revised: 12/02/2015 Document Reviewed: 03/30/2015 Elsevier Interactive Patient Education  [employer].

## 2017-01-29 ENCOUNTER — Other Ambulatory Visit: Payer: Self-pay | Admitting: Internal Medicine

## 2017-01-29 MED FILL — JANUVIA 100 MG TABLET: 100 | 30 days supply | Qty: 30 | Fill #0

## 2017-01-29 MED FILL — SIMVASTATIN 20 MG TABLET: 20 | 30 days supply | Qty: 30 | Fill #1

## 2017-01-29 MED FILL — metFORMIN HCL 1000 MG TABS: 1000 | 90 days supply | Qty: 180 | Fill #3

## 2017-02-07 ENCOUNTER — Other Ambulatory Visit: Payer: Self-pay | Admitting: Family Medicine

## 2017-02-07 MED FILL — DASETTA 1-35-28 TABLET: 1-35 | 84 days supply | Qty: 84 | Fill #3

## 2017-02-27 MED FILL — SIMVASTATIN 20 MG TABLET: 20 | 30 days supply | Qty: 30 | Fill #2

## 2017-02-27 MED FILL — JANUVIA 100 MG TABLET: 100 | 30 days supply | Qty: 30 | Fill #1

## 2017-03-20 ENCOUNTER — Ambulatory Visit (INDEPENDENT_AMBULATORY_CARE_PROVIDER_SITE_OTHER): Payer: 59 | Admitting: Internal Medicine

## 2017-03-20 ENCOUNTER — Telehealth: Payer: Self-pay | Admitting: Family Medicine

## 2017-03-20 ENCOUNTER — Encounter: Payer: Self-pay | Admitting: Internal Medicine

## 2017-03-20 VITALS — BP 148/84 | HR 108 | Temp 99.1°F | Resp 16 | Wt 164.0 lb

## 2017-03-20 DIAGNOSIS — E1165 Type 2 diabetes mellitus with hyperglycemia: Secondary | ICD-10-CM

## 2017-03-20 DIAGNOSIS — J0110 Acute frontal sinusitis, unspecified: Secondary | ICD-10-CM | POA: Diagnosis not present

## 2017-03-20 DIAGNOSIS — R030 Elevated blood-pressure reading, without diagnosis of hypertension: Secondary | ICD-10-CM | POA: Diagnosis not present

## 2017-03-20 MED ORDER — PREDNISONE 10 MG PO TABS: ORAL_TABLET | ORAL | 0 refills | Status: DC

## 2017-03-20 MED FILL — predniSONE 10 MG TABS: 10 | 7 days supply | Qty: 19 | Fill #0

## 2017-03-20 NOTE — Patient Instructions (Signed)
Take the steroid taper as prescribed.  Continue the over the counters that help.   Call if no improvement    Sinusitis, Adult Sinusitis is soreness and inflammation of your sinuses. Sinuses are hollow spaces in the bones around your face. Your sinuses are located:  Around your eyes.  In the middle of your forehead.  Behind your nose.  In your cheekbones.  Your sinuses and nasal passages are lined with a stringy fluid (mucus). Mucus normally drains out of your sinuses. When your nasal tissues become inflamed or swollen, the mucus can become trapped or blocked so air cannot flow through your sinuses. This allows bacteria, viruses, and funguses to grow, which leads to infection. Sinusitis can develop quickly and last for 7?10 days (acute) or for more than 12 weeks (chronic). Sinusitis often develops after a cold. What are the causes? This condition is caused by anything that creates swelling in the sinuses or stops mucus from draining, including:  Allergies.  Asthma.  Bacterial or viral infection.  Abnormally shaped bones between the nasal passages.  Nasal growths that contain mucus (nasal polyps).  Narrow sinus openings.  Pollutants, such as chemicals or irritants in the air.  A foreign object stuck in the nose.  A fungal infection. This is rare.  What increases the risk? The following factors may make you more likely to develop this condition:  Having allergies or asthma.  Having had a recent cold or respiratory tract infection.  Having structural deformities or blockages in your nose or sinuses.  Having a weak immune system.  Doing a lot of swimming or diving.  Overusing nasal sprays.  Smoking.  What are the signs or symptoms? The main symptoms of this condition are pain and a feeling of pressure around the affected sinuses. Other symptoms include:  Upper toothache.  Earache.  Headache.  Bad breath.  Decreased sense of smell and taste.  A cough  that may get worse at night.  Fatigue.  Fever.  Thick drainage from your nose. The drainage is often green and it may contain pus (purulent).  Stuffy nose or congestion.  Postnasal drip. This is when extra mucus collects in the throat or back of the nose.  Swelling and warmth over the affected sinuses.  Sore throat.  Sensitivity to light.  How is this diagnosed? This condition is diagnosed based on symptoms, a medical history, and a physical exam. To find out if your condition is acute or chronic, your health care provider may:  Look in your nose for signs of nasal polyps.  Tap over the affected sinus to check for signs of infection.  View the inside of your sinuses using an imaging device that has a light attached (endoscope).  If your health care provider suspects that you have chronic sinusitis, you may also:  Be tested for allergies.  Have a sample of mucus taken from your nose (nasal culture) and checked for bacteria.  Have a mucus sample examined to see if your sinusitis is related to an allergy.  If your sinusitis does not respond to treatment and it lasts longer than 8 weeks, you may have an MRI or CT scan to check your sinuses. These scans also help to determine how severe your infection is. In rare cases, a bone biopsy may be done to rule out more serious types of fungal sinus disease. How is this treated? Treatment for sinusitis depends on the cause and whether your condition is chronic or acute. If a virus is  causing your sinusitis, your symptoms will go away on their own within 10 days. You may be given medicines to relieve your symptoms, including:  Topical nasal decongestants. They shrink swollen nasal passages and let mucus drain from your sinuses.  Antihistamines. These drugs block inflammation that is triggered by allergies. This can help to ease swelling in your nose and sinuses.  Topical nasal corticosteroids. These are nasal sprays that ease  inflammation and swelling in your nose and sinuses.  Nasal saline washes. These rinses can help to get rid of thick mucus in your nose.  If your condition is caused by bacteria, you will be given an antibiotic medicine. If your condition is caused by a fungus, you will be given an antifungal medicine. Surgery may be needed to correct underlying conditions, such as narrow nasal passages. Surgery may also be needed to remove polyps. Follow these instructions at home: Medicines  Take, use, or apply over-the-counter and prescription medicines only as told by your health care provider. These may include nasal sprays.  If you were prescribed an antibiotic medicine, take it as told by your health care provider. Do not stop taking the antibiotic even if you start to feel better. Hydrate and Humidify  Drink enough water to keep your urine clear or pale yellow. Staying hydrated will help to thin your mucus.  Use a cool mist humidifier to keep the humidity level in your home above 50%.  Inhale steam for 10-15 minutes, 3-4 times a day or as told by your health care provider. You can do this in the bathroom while a hot shower is running.  Limit your exposure to cool or dry air. Rest  Rest as much as possible.  Sleep with your head raised (elevated).  Make sure to get enough sleep each night. General instructions  Apply a warm, moist washcloth to your face 3-4 times a day or as told by your health care provider. This will help with discomfort.  Wash your hands often with soap and water to reduce your exposure to viruses and other germs. If soap and water are not available, use hand sanitizer.  Do not smoke. Avoid being around people who are smoking (secondhand smoke).  Keep all follow-up visits as told by your health care provider. This is important. Contact a health care provider if:  You have a fever.  Your symptoms get worse.  Your symptoms do not improve within 10 days. Get help  right away if:  You have a severe headache.  You have persistent vomiting.  You have pain or swelling around your face or eyes.  You have vision problems.  You develop confusion.  Your neck is stiff.  You have trouble breathing. This information is not intended to replace advice given to you by your health care provider. Make sure you discuss any questions you have with your health care provider. Document Released: 06/26/2005 Document Revised: 02/20/2016 Document Reviewed: 04/21/2015 Elsevier Interactive Patient Education  2017 [employer].

## 2017-03-20 NOTE — Telephone Encounter (Signed)
Needs to confirm dosage and directions of prednisone.

## 2017-03-20 NOTE — Assessment & Plan Note (Signed)
Likely viral in nature - no need for antibiotics Having significant associated sinus pain and headache Will prescribed prednisone taper Ok to continue otc cold meds Call if no improvement

## 2017-03-20 NOTE — Assessment & Plan Note (Signed)
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

## 2017-03-20 NOTE — Telephone Encounter (Signed)
Quantity changed to sig count.

## 2017-03-20 NOTE — Assessment & Plan Note (Signed)
Lab Results  Component Value Date   HGBA1C 6.7 01/19/2017   Well controlled Discussed prednisone may increase sugars temporarily and she will monitor her sugars at home Continue current medications Call with concerns

## 2017-03-20 NOTE — Progress Notes (Signed)
Subjective:    Patient ID: [name] Young, female    DOB: [birth year], 46 y.o.   MRN: [id number]  HPI She is here for an acute visit for cold symptoms.   Her symptoms started last week with dizziness.  She then developed frontal sinus pain and headaches.  She states leaning over causes significant sinus pain.  She continues to have dizziness.  She has a mild cough, mild post nasal drip.  She denies fever, chills, congestion, ear pain, sore throat, SOB and wheeze. She does have a history of sinus infections, but has not had one in a while.   She has tried taking dayquil, nyquil, tylenol, advil, sudafed (otc and from pharmacy).  The pressure and headaches have not improved.     She has diabetes and her sugars are well controlled.  She does take her medication daily.   Elevated blood pressure:  Her BP is generally well controlled.  She is not on any medication for her BP.  She has been taking sudafed and other cold medications.  She has not had any chest pain or palpitations.   Medications and allergies reviewed with patient and updated if appropriate.  Patient Active Problem List   Diagnosis Date Noted  . Genetic testing 06/26/2016  . Family history of breast cancer   . Type 2 diabetes mellitus with hyperglycemia ([place]) 09/13/2015  . Nonrheumatic mitral valve prolapse 03/29/2015  . Vitamin B12 deficiency 03/12/2015  . Dysplastic nevus of trunk 03/05/2012  . HSV (herpes simplex virus) infection 02/06/2012  . DUB (dysfunctional uterine bleeding) 02/06/2012  . Dysplastic nevus of lower extremity, right 12/12/2011  . SHOULDER PAIN, RIGHT 05/17/2009  . Hyperlipidemia 10/23/2008  . ALLERGIC REACTION 07/10/2007    Current Outpatient Prescriptions on File Prior to Visit  Medication Sig Dispense Refill  . ACCU-CHEK FASTCLIX LANCETS MISC Use to check sugars daily 200 each 5  . acyclovir (ZOVIRAX) 400 MG tablet TAKE 2 TABLETS BY MOUTH 2 TIMES DAILY. 50 tablet 3  . B Complex-C (SUPER B  COMPLEX PO) Take 1 tablet by mouth daily.    . Blood Glucose Monitoring Suppl (ACCU-CHEK GUIDE) w/Device KIT 1 each by Does not apply route daily. 1 kit 0  . cholecalciferol (VITAMIN D) 1000 UNITS tablet Take 1,000 Units by mouth daily.    . Continuous Blood Gluc Receiver (FREESTYLE LIBRE READER) DEVI 1 Device by Does not apply route 3 (three) times daily. 1 Device 1  . Continuous Blood Gluc Sensor (FREESTYLE LIBRE SENSOR SYSTEM) MISC 1 Device by Does not apply route every 30 (thirty) days. 3 each 11  . fluticasone (FLONASE) 50 MCG/ACT nasal spray INSTILL 1 SPRAY IN EACH NOSTRIL AT BEDTIME 16 g 6  . glipiZIDE (GLUCOTROL) 10 MG tablet TAKE 1/2 TABLET BY MOUTH 2 TIMES DAILY BEFORE A MEAL. 60 tablet 3  . glucose blood (ACCU-CHEK GUIDE) test strip Use as instructed to check sugar daily 100 each 5  . INVOKANA 100 MG TABS tablet TAKE 1 TABLET BY MOUTH ONCE DAILY 30 tablet PRN  . JANUVIA 100 MG tablet TAKE 1 TABLET BY MOUTH ONCE DAILY 30 tablet 5  . metFORMIN (GLUCOPHAGE) 1000 MG tablet TAKE 1 TABLET BY MOUTH 2 TIMES DAILY WITH A MEAL. 200 tablet 4  . Multiple Vitamins-Minerals (DIASENSE MULTIVITAMIN) TABS Take 1 tablet by mouth daily.      . norethindrone-ethinyl estradiol 1/35 (DASETTA 1/35) tablet Take 1 tablet by mouth daily. 1 Package 11  . Omeprazole (PRILOSEC PO) Take by  mouth.    . simvastatin (ZOCOR) 20 MG tablet Take 1 tablet (20 mg total) by mouth at bedtime. 90 tablet 3  . vitamin B-12 (CYANOCOBALAMIN) 1000 MCG tablet Take 1,000 mcg by mouth daily.     . vitamin C (ASCORBIC ACID) 500 MG tablet Take 500 mg by mouth daily.     No current facility-administered medications on file prior to visit.     Past Medical History:  Diagnosis Date  . Diabetes mellitus    Type II  . Family history of breast cancer   . Family history of breast cancer     No past surgical history on file.  Social History   Social History  . Marital status: Married    Spouse name: Arva Chafe  . Number of children: 1   . Years of education: N/A   Social History Main Topics  . Smoking status: Never Smoker  . Smokeless tobacco: Never Used  . Alcohol use No  . Drug use: No  . Sexual activity: Not on file   Other Topics Concern  . Not on file   Social History Narrative   Regular exercise-No    Family History  Problem Relation Age of Onset  . Diabetes Other        Familly Hx First degree relative  . Breast cancer Mother 37  . Breast cancer Maternal Aunt 64  . Brain cancer Maternal Uncle 58    Review of Systems  Constitutional: Negative for chills and fever.  HENT: Positive for postnasal drip and sinus pain. Negative for congestion, ear pain and sore throat.   Respiratory: Positive for cough (minimal from PND). Negative for shortness of breath and wheezing.   Cardiovascular: Negative for chest pain and palpitations.  Gastrointestinal: Negative for diarrhea and nausea.  Musculoskeletal: Negative for myalgias.  Neurological: Positive for dizziness and headaches.       Objective:   Vitals:   03/20/17 1313  BP: (!) 148/84  Pulse: (!) 108  Resp: 16  Temp: 99.1 F (37.3 C)  SpO2: 97%   Filed Weights   03/20/17 1313  Weight: 164 lb (74.4 kg)   Body mass index is 26.47 kg/m.  Wt Readings from Last 3 Encounters:  03/20/17 164 lb (74.4 kg)  01/24/17 165 lb (74.8 kg)  01/19/17 165 lb (74.8 kg)     Physical Exam GENERAL APPEARANCE: Appears stated age, well appearing, NAD EYES: conjunctiva clear, no icterus HEENT: bilateral tympanic membranes and ear canals normal, oropharynx with no erythema, no thyromegaly, trachea midline, no cervical or supraclavicular lymphadenopathy LUNGS: Clear to auscultation without wheeze or crackles, unlabored breathing, good air entry bilaterally HEART: Normal S1,S2 without murmurs EXTREMITIES: Without clubbing, cyanosis, or edema       Assessment & Plan:   See Problem List for Assessment and Plan of chronic medical problems.

## 2017-03-21 MED FILL — INVOKANA 100 MG TABLET: 100 | 90 days supply | Qty: 90 | Fill #7

## 2017-03-23 MED FILL — JANUVIA 100 MG TABLET: 100 | 30 days supply | Qty: 30 | Fill #2

## 2017-04-06 MED FILL — glipiZIDE 10 MG TABS: 10 | 90 days supply | Qty: 90 | Fill #1

## 2017-04-06 MED FILL — SIMVASTATIN 20 MG TABLET: 20 | 90 days supply | Qty: 90 | Fill #0

## 2017-04-12 MED FILL — FLUTICASONE PROP 50 MCG SPR: 50 | 60 days supply | Qty: 16 | Fill #4

## 2017-04-20 ENCOUNTER — Ambulatory Visit (INDEPENDENT_AMBULATORY_CARE_PROVIDER_SITE_OTHER): Payer: 59 | Admitting: Internal Medicine

## 2017-04-20 ENCOUNTER — Encounter: Payer: Self-pay | Admitting: Internal Medicine

## 2017-04-20 VITALS — BP 110/90 | HR 82 | Temp 98.1°F | Wt 161.2 lb

## 2017-04-20 DIAGNOSIS — E1165 Type 2 diabetes mellitus with hyperglycemia: Secondary | ICD-10-CM | POA: Diagnosis not present

## 2017-04-20 DIAGNOSIS — E538 Deficiency of other specified B group vitamins: Secondary | ICD-10-CM | POA: Diagnosis not present

## 2017-04-20 LAB — POCT GLYCOSYLATED HEMOGLOBIN (HGB A1C): HEMOGLOBIN A1C: 6.5

## 2017-04-20 LAB — MICROALBUMIN / CREATININE URINE RATIO
CREATININE, U: 109.7 mg/dL
MICROALB/CREAT RATIO: 0.6 mg/g (ref 0.0–30.0)

## 2017-04-20 NOTE — Patient Instructions (Addendum)
Please continue: - Metformin 2000 mg with dinner  - Invokana 100 mg daily - Januvia 100 mcg daily  - Regular Glipizide 2.5 mg 2x a day  Please stop at the lab.  Please come back for a follow-up appointment in 3-4 months.

## 2017-04-20 NOTE — Progress Notes (Signed)
Patient ID: [name] Young, female   DOB: May 30, 1971, 46 y.o.   MRN: [id number]  HPI: [name] Young is a 46 y.o.-year-old female, returning for f/u for DM2, dx in 2008 (GDM 1998), non-insulin-dependent, controlled, without long-term complications. Last visit 3 mo ago.  She was on Weight Watchers after our last visit >> did well, lost weight, but relaxed diet in last 3 weeks >> gained some weight back and sugars higher. She was also on Prednisone last week for sinusitis.   DM2: Last hemoglobin A1c was: Lab Results  Component Value Date   HGBA1C 6.7 01/19/2017   HGBA1C 6.4 09/07/2016   HGBA1C 6.6 (H) 03/16/2016  Since she was dx with DM >> she lost 50 lbs and was able to come off Lantus.  Pt is on a regimen of: - Metformin 2000 mg with dinner  - Invokana 100 mg daily - Januvia 100 mcg daily  - Regular Glipizide 2.5 mg 2x a day  Pt checks her sugars 1-2x a day: - am:  70-132, 146, 152 >> 49, 58, 89-146, 190 >> 54 (took Glipizide and did not eat), 73, 93-152, 187, 212, 234  - 2h after b'fast: 305 >> n/c >> 185 >> 190 >> n/c - before lunch:   60, 92-113 >> 120 >> n/c >> 100 - >2h after lunch: 159-209 >> n/c >> 158 >> n/c  - before dinner:  106-112 >> 90, 142 >> 167, 233 >> n/c - 2h after dinner: 195 >> 153, 219 >> n/c >> 124 >> n/c - bedtime:  N/c >> 144, 157 >> n/c  - nighttime: 58 (skipped dinner) >> n/c >> 118 >> n/c Lowest sugar was 81 >> 49 (alcohol) >> 54; she has hypoglycemia awareness at 60.  Highest sugar was 211 >> 190 >> 234.  Glucometer: Bayer  Pt's meals are: - Breakfast: Eggs and cheese + grits or omelette - Lunch: Salad or veggies or tuna salad with saltines or chicken tenders - Dinner: Fast food - Snacks: Graham crackers  - No CKD, last BUN/creatinine:  Lab Results  Component Value Date   BUN 11 01/24/2017   CREATININE 0.77 01/24/2017   Prev. on Lisinopril >> stopped >> ACR remained normal: Lab Results  Component Value Date   MICRALBCREAT 0.7 03/16/2016    MICRALBCREAT 0.7 03/22/2015   MICRALBCREAT 0.7 10/27/2014   MICRALBCREAT 0.1 02/09/2014   MICRALBCREAT 0.1 01/28/2013   MICRALBCREAT 0.3 06/07/2011   MICRALBCREAT 0.2 11/29/2010   MICRALBCREAT 4.6 10/21/2009   MICRALBCREAT 11.2 09/18/2006   - last set of lipids: Lab Results  Component Value Date   CHOL 163 01/24/2017   HDL 66.50 01/24/2017   LDLCALC 63 01/24/2017   LDLDIRECT 62.0 03/22/2015   TRIG 167.0 (H) 01/24/2017   CHOLHDL 2 01/24/2017  On Zocor - last eye exam was in 12/2016 >> No DR - she has numbness and tingling in her foot  Vit B12 def. - Currently on 1000 mcg B12 daily (also B complex)  Reviewed previous levels - normal: Lab Results  Component Value Date   VITAMINB12 782 01/24/2017   VITAMINB12 969 (H) 03/16/2016   VITAMINB12 503 09/13/2015   VITAMINB12 923 (H) 03/12/2015   VITAMINB12 220 12/10/2014   ROS: Constitutional: + weight loss and gain, no fatigue, no subjective hyperthermia, no subjective hypothermia Eyes: no blurry vision, no xerophthalmia ENT: no sore throat, no nodules palpated in throat, no dysphagia, no odynophagia, no hoarseness Cardiovascular: no CP/no SOB/no palpitations/no leg swelling Respiratory: no cough/no SOB/no  wheezing Gastrointestinal: no N/no V/no D/no C/no acid reflux Musculoskeletal: no muscle aches/no joint aches Skin: no rashes, no hair loss Neurological: no tremors/+ numbness/+ tingling/no dizziness  I reviewed pt's medications, allergies, PMH, social hx, family hx, and changes were documented in the history of present illness. Otherwise, unchanged from my initial visit note.  Past Medical History:  Diagnosis Date  . Diabetes mellitus    Type II  . Family history of breast cancer   . Family history of breast cancer    No past surgical history on file. History   Social History  . Marital Status: Married    Spouse Name: N/A  . Number of Children: 1   Occupational History  . RN [employer]   Social History Main  Topics  . Smoking status: Never Smoker   . Smokeless tobacco: Not on file  . Alcohol Use: Socially; liquor  . Drug Use: No   Social History Narrative   Regular exercise - 2-3x a week   Current Outpatient Prescriptions on File Prior to Visit  Medication Sig Dispense Refill  . ACCU-CHEK FASTCLIX LANCETS MISC Use to check sugars daily 200 each 5  . acyclovir (ZOVIRAX) 400 MG tablet TAKE 2 TABLETS BY MOUTH 2 TIMES DAILY. 50 tablet 3  . B Complex-C (SUPER B COMPLEX PO) Take 1 tablet by mouth daily.    . Blood Glucose Monitoring Suppl (ACCU-CHEK GUIDE) w/Device KIT 1 each by Does not apply route daily. 1 kit 0  . cholecalciferol (VITAMIN D) 1000 UNITS tablet Take 1,000 Units by mouth daily.    . Continuous Blood Gluc Receiver (FREESTYLE LIBRE READER) DEVI 1 Device by Does not apply route 3 (three) times daily. 1 Device 1  . Continuous Blood Gluc Sensor (FREESTYLE LIBRE SENSOR SYSTEM) MISC 1 Device by Does not apply route every 30 (thirty) days. 3 each 11  . fluticasone (FLONASE) 50 MCG/ACT nasal spray INSTILL 1 SPRAY IN EACH NOSTRIL AT BEDTIME 16 g 6  . glipiZIDE (GLUCOTROL) 10 MG tablet TAKE 1/2 TABLET BY MOUTH 2 TIMES DAILY BEFORE A MEAL. 60 tablet 3  . glucose blood (ACCU-CHEK GUIDE) test strip Use as instructed to check sugar daily 100 each 5  . INVOKANA 100 MG TABS tablet TAKE 1 TABLET BY MOUTH ONCE DAILY 30 tablet PRN  . JANUVIA 100 MG tablet TAKE 1 TABLET BY MOUTH ONCE DAILY 30 tablet 5  . metFORMIN (GLUCOPHAGE) 1000 MG tablet TAKE 1 TABLET BY MOUTH 2 TIMES DAILY WITH A MEAL. 200 tablet 4  . Multiple Vitamins-Minerals (DIASENSE MULTIVITAMIN) TABS Take 1 tablet by mouth daily.      . norethindrone-ethinyl estradiol 1/35 (DASETTA 1/35) tablet Take 1 tablet by mouth daily. 1 Package 11  . Omeprazole (PRILOSEC PO) Take by mouth.    . predniSONE (DELTASONE) 10 MG tablet Take 4 tabs po qd x 2 days, then 3 tabs po qd x 2 days, then 2 tabs po qd x 2 days, then 1 tab po qd x 1 days 21 tablet 0  .  simvastatin (ZOCOR) 20 MG tablet Take 1 tablet (20 mg total) by mouth at bedtime. 90 tablet 3  . vitamin B-12 (CYANOCOBALAMIN) 1000 MCG tablet Take 1,000 mcg by mouth daily.     . vitamin C (ASCORBIC ACID) 500 MG tablet Take 500 mg by mouth daily.     No current facility-administered medications on file prior to visit.    Allergies  Allergen Reactions  . Penicillins     REACTION: RASH  Family History  Problem Relation Age of Onset  . Diabetes Other        Familly Hx First degree relative  . Breast cancer Mother 59  . Breast cancer Maternal Aunt 64  . Brain cancer Maternal Uncle 58   PE: BP 110/90 (BP Location: Left Arm, Patient Position: Sitting, Cuff Size: Normal)   Pulse 82   Temp 98.1 F (36.7 C) (Oral)   Wt 161 lb 4 oz (73.1 kg)   SpO2 98%   BMI 26.03 kg/m  Body mass index is 26.03 kg/m. Wt Readings from Last 3 Encounters:  04/20/17 161 lb 4 oz (73.1 kg)  03/20/17 164 lb (74.4 kg)  01/24/17 165 lb (74.8 kg)   Constitutional: slightly overweight, in NAD Eyes: PERRLA, EOMI, no exophthalmos ENT: moist mucous membranes, no thyromegaly, no cervical lymphadenopathy Cardiovascular: RRR, No MRG Respiratory: CTA B Gastrointestinal: abdomen soft, NT, ND, BS+ Musculoskeletal: no deformities, strength intact in all 4 Skin: moist, warm, no rashes Neurological: no tremor with outstretched hands, DTR normal in all 4  ASSESSMENT: 1. DM2, non-insulin-dependent, controlled, without lon term complications, but with hyperglycemia hyperglycemia  2. B12 deficiency  PLAN:  1. Patient with long-standing, fairly well controlled diabetes, on oral antidiabetic regimen. At last visit, her sugars were worse after starting to eat more fast food, especially at night. At last visit, we discussed about the need to cut out fast food and I also made some suggestions about meal planning. - since then, she did well on Weight Watchers diet >> now off the wagon, but plans to restart.  - sugars  worse in last 2-3 weeks, but she was also on Prednisone - will not change regimen for now but I would like to stop Glipizide after the Holidays, if her sugars improve. She agrees. - I suggested to:  Patient Instructions  Please continue: - Metformin 2000 mg with dinner  - Invokana 100 mg daily - Januvia 100 mcg daily  - Regular Glipizide 2.5 mg 2x a day  Please stop at the lab.  Please come back for a follow-up appointment in 3-4 months.  - today, HbA1c is 6.5% (stable) - continue checking sugars at different times of the day - check 1x a day, rotating checks - advised for yearly eye exams >> she is UTD - UTD with flu shot - will need a new ACR today - Return to clinic in 3 mo with sugar log   2. B12 deficiency - on 1000 mcg daily - Latest level was great 3 mo ago: Lab Results  Component Value Date   VITAMINB12 782 01/24/2017  - continue current dose  Office Visit on 04/20/2017  Component Date Value Ref Range Status  . Microalb, Ur 04/20/2017 <0.7  0.0 - 1.9 mg/dL Final  . Creatinine,U 04/20/2017 109.7  mg/dL Final  . Microalb Creat Ratio 04/20/2017 0.6  0.0 - 30.0 mg/g Final  . Hemoglobin A1C 04/20/2017 6.5   Final   Normal ACR.  Philemon Kingdom, MD PhD [facility]

## 2017-04-24 ENCOUNTER — Other Ambulatory Visit: Payer: Self-pay | Admitting: Family Medicine

## 2017-04-25 MED FILL — JANUVIA 100 MG TABLET: 100 | 30 days supply | Qty: 30 | Fill #3

## 2017-04-25 MED FILL — DASETTA 1-35-28 TABLET: 1-35 | 84 days supply | Qty: 84 | Fill #0

## 2017-05-01 ENCOUNTER — Other Ambulatory Visit: Payer: Self-pay | Admitting: Family Medicine

## 2017-05-01 DIAGNOSIS — E119 Type 2 diabetes mellitus without complications: Secondary | ICD-10-CM

## 2017-05-02 MED FILL — metFORMIN HCL 1000 MG TABS: 1000 | 90 days supply | Qty: 180 | Fill #0

## 2017-05-17 MED FILL — ACCU-CHEK GUIDE TEST STRIP: 90 days supply | Qty: 100 | Fill #2

## 2017-05-22 ENCOUNTER — Other Ambulatory Visit: Payer: Self-pay | Admitting: Family Medicine

## 2017-05-22 MED FILL — ACYCLOVIR 400 MG TABLET: 400 | 13 days supply | Qty: 50 | Fill #0

## 2017-05-28 MED FILL — JANUVIA 100 MG TABLET: 100 | 30 days supply | Qty: 30 | Fill #4

## 2017-06-15 MED FILL — ACYCLOVIR 400 MG TABLET: 400 | 13 days supply | Qty: 50 | Fill #1

## 2017-06-26 ENCOUNTER — Other Ambulatory Visit: Payer: Self-pay | Admitting: Internal Medicine

## 2017-06-26 MED FILL — JANUVIA 100 MG TABLET: 100 | 30 days supply | Qty: 30 | Fill #5

## 2017-06-26 MED FILL — INVOKANA 100 MG TABLET: 100 | 90 days supply | Qty: 90 | Fill #0

## 2017-07-05 ENCOUNTER — Other Ambulatory Visit: Payer: Self-pay | Admitting: Family Medicine

## 2017-07-05 DIAGNOSIS — T7840XD Allergy, unspecified, subsequent encounter: Secondary | ICD-10-CM

## 2017-07-05 MED FILL — FLUTICASONE PROP 50 MCG SPR: 50 | 60 days supply | Qty: 16 | Fill #0

## 2017-07-09 MED FILL — glipiZIDE 10 MG TABS: 10 | 60 days supply | Qty: 60 | Fill #2

## 2017-07-09 MED FILL — SIMVASTATIN 20 MG TABLET: 20 | 90 days supply | Qty: 90 | Fill #1

## 2017-07-19 MED FILL — DASETTA 1-35-28 TABLET: 1-35 | 84 days supply | Qty: 84 | Fill #1

## 2017-07-23 ENCOUNTER — Other Ambulatory Visit: Payer: Self-pay | Admitting: Internal Medicine

## 2017-07-23 MED FILL — JANUVIA 100 MG TABLET: 100 | 30 days supply | Qty: 30 | Fill #0

## 2017-07-24 MED FILL — metFORMIN HCL 1000 MG TABS: 1000 | 90 days supply | Qty: 180 | Fill #1

## 2017-08-14 MED FILL — ACYCLOVIR 400 MG TABLET: 400 | 13 days supply | Qty: 50 | Fill #2

## 2017-08-21 ENCOUNTER — Ambulatory Visit: Payer: 59 | Admitting: Internal Medicine

## 2017-08-21 ENCOUNTER — Encounter: Payer: Self-pay | Admitting: Internal Medicine

## 2017-08-21 VITALS — BP 142/78 | HR 82 | Ht 66.0 in | Wt 169.2 lb

## 2017-08-21 DIAGNOSIS — E1165 Type 2 diabetes mellitus with hyperglycemia: Secondary | ICD-10-CM | POA: Diagnosis not present

## 2017-08-21 DIAGNOSIS — E538 Deficiency of other specified B group vitamins: Secondary | ICD-10-CM

## 2017-08-21 DIAGNOSIS — E782 Mixed hyperlipidemia: Secondary | ICD-10-CM | POA: Diagnosis not present

## 2017-08-21 LAB — POCT GLYCOSYLATED HEMOGLOBIN (HGB A1C): HEMOGLOBIN A1C: 6.7

## 2017-08-21 NOTE — Patient Instructions (Addendum)
Please continue: - Metformin 2000 mg with dinner - Invokana 100 mg daily - Januvia 100 mcg daily  - Glipizide 2.5 mg 2x a day - increase to 5 mg before a larger dinner  Please return in 3 months with your sugar log.

## 2017-08-21 NOTE — Progress Notes (Signed)
Patient ID: [name] Young, female   DOB: [birth year], 47 y.o.   MRN: [id number]  HPI: [name] Young is a 47 y.o.-year-old female, returning for f/u for DM2, dx in 2008 (GDM 1998), non-insulin-dependent, controlled, without long-term complications. Last visit 4 months ago.   DM2: Last hemoglobin A1c was: Lab Results  Component Value Date   HGBA1C 6.5 04/20/2017   HGBA1C 6.7 01/19/2017   HGBA1C 6.4 09/07/2016  Since she was dx with DM >> she lost 50 lbs and was able to come off Lantus.  Pt is on a regimen of: - Metformin 2000 mg with dinner - Invokana 100 mg daily - Januvia 100 mcg daily  - Glipizide 2.5 mg 2x a day  Pt checks her sugars 1-2 times a day: - am:  54, 73, 93-152, 187, 212, 234 >> 95-166, 190 - 2h after b'fast:185 >> 190 >> n/c >> 163 - before lunch:    120 >> n/c >> 100 >> n/c - >2h after lunch: n/c >> 158 >> n/c  - before dinner: 90, 142 >> 167, 233 >> n/c >> 106-163 - 2h after dinner: 153, 219 >> n/c >> 124 >> n/c - bedtime:  N/c >> 144, 157 >> n/c  - nighttime: 58 >> n/c >> 118 >> n/c Lowest sugar was 81 >> 49 (alcohol) >> 54 >> 95; she has hypoglycemia awareness in the 60s. Highest sugar was 211 >> 190 >> 234 >> 252.  Glucometer: Bayer  Pt's meals are: - Breakfast: Eggs and cheese + grits or omelette - Lunch: Salad or veggies or tuna salad with saltines or chicken tenders - Dinner: Fast food - Snacks: Graham crackers  -No CKD, last BUN/creatinine:  Lab Results  Component Value Date   BUN 11 01/24/2017   CREATININE 0.77 01/24/2017   She was previously on lisinopril, then stopped.  ACR normal including at last visit: Lab Results  Component Value Date   MICRALBCREAT 0.6 04/20/2017   MICRALBCREAT 0.7 03/16/2016   MICRALBCREAT 0.7 03/22/2015   MICRALBCREAT 0.7 10/27/2014   MICRALBCREAT 0.1 02/09/2014   MICRALBCREAT 0.1 01/28/2013   MICRALBCREAT 0.3 06/07/2011   MICRALBCREAT 0.2 11/29/2010   MICRALBCREAT 4.6 10/21/2009   MICRALBCREAT 11.2  09/18/2006   -+ HL; last set of lipids: Lab Results  Component Value Date   CHOL 163 01/24/2017   HDL 66.50 01/24/2017   LDLCALC 63 01/24/2017   LDLDIRECT 62.0 03/22/2015   TRIG 167.0 (H) 01/24/2017   CHOLHDL 2 01/24/2017  On Zocor. - last eye exam was in 12/2016: No DR - + Numbness and tingling in her feet  Vit B12 def. -Currently on 1000 mcg B12 vitamin and B complex daily  Reviewed previous levels, normal in the last 3 years: Lab Results  Component Value Date   VITAMINB12 782 01/24/2017   VITAMINB12 969 (H) 03/16/2016   VITAMINB12 503 09/13/2015   VITAMINB12 923 (H) 03/12/2015   VITAMINB12 220 12/10/2014   ROS: Constitutional: +weight gain/no weight loss, no fatigue, no subjective hyperthermia, no subjective hypothermia Eyes: no blurry vision, no xerophthalmia ENT: no sore throat, no nodules palpated in throat, no dysphagia, no odynophagia, no hoarseness Cardiovascular: no CP/no SOB/no palpitations/no leg swelling Respiratory: no cough/no SOB/no wheezing Gastrointestinal: no N/no V/no D/no C/no acid reflux Musculoskeletal: no muscle aches/no joint aches Skin: no rashes, no hair loss Neurological: no tremors/no numbness/no tingling/no dizziness  I reviewed pt's medications, allergies, PMH, social hx, family hx, and changes were documented in the history of present illness. Otherwise,  unchanged from my initial visit note.  Past Medical History:  Diagnosis Date  . Diabetes mellitus    Type II  . Family history of breast cancer   . Family history of breast cancer    No past surgical history on file. History   Social History  . Marital Status: Married    Spouse Name: N/A  . 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 Smoking status: Never Smoker   . Smokeless tobacco: Not on file  . Alcohol Use: Socially; liquor  . Drug Use: No   Social History Narrative   Regular exercise - 2-3x a week   Current Outpatient  Medications on File Prior to Visit  Medication Sig Dispense Refill  . ACCU-CHEK FASTCLIX LANCETS MISC Use to check sugars daily 200 each 5  . acyclovir (ZOVIRAX) 400 MG tablet TAKE 2 TABLETS BY MOUTH 2 TIMES DAILY. 50 tablet 3  . B Complex-C (SUPER B COMPLEX PO) Take 1 tablet by mouth daily.    . Blood Glucose Monitoring Suppl (ACCU-CHEK GUIDE) w/Device KIT 1 each by Does not apply route daily. 1 kit 0  . cholecalciferol (VITAMIN D) 1000 UNITS tablet Take 1,000 Units by mouth daily.    . Continuous Blood Gluc Receiver (FREESTYLE LIBRE READER) DEVI 1 Device by Does not apply route 3 (three) times daily. 1 Device 1  . Continuous Blood Gluc Sensor (FREESTYLE LIBRE SENSOR SYSTEM) MISC 1 Device by Does not apply route every 30 (thirty) days. 3 each 11  . DASETTA 1/35 tablet TAKE 1 TABLET BY MOUTH ONCE DAILY 28 tablet 11  . fluticasone (FLONASE) 50 MCG/ACT nasal spray Place 1 spray into both nostrils at bedtime. 16 g 6  . glipiZIDE (GLUCOTROL) 10 MG tablet TAKE 1/2 TABLET BY MOUTH 2 TIMES DAILY BEFORE A MEAL. 60 tablet 3  . glucose blood (ACCU-CHEK GUIDE) test strip Use as instructed to check sugar daily 100 each 5  . INVOKANA 100 MG TABS tablet TAKE 1 TABLET BY MOUTH ONCE DAILY 90 tablet 0  . JANUVIA 100 MG tablet TAKE 1 TABLET BY MOUTH ONCE DAILY 30 tablet 5  . metFORMIN (GLUCOPHAGE) 1000 MG tablet TAKE 1 TABLET BY MOUTH 2 TIMES DAILY WITH A MEAL. 200 tablet 4  . Multiple Vitamins-Minerals (DIASENSE MULTIVITAMIN) TABS Take 1 tablet by mouth daily.      . Omeprazole (PRILOSEC PO) Take by mouth.    . simvastatin (ZOCOR) 20 MG tablet Take 1 tablet (20 mg total) by mouth at bedtime. 90 tablet 3  . vitamin B-12 (CYANOCOBALAMIN) 1000 MCG tablet Take 1,000 mcg by mouth daily.     . vitamin C (ASCORBIC ACID) 500 MG tablet Take 500 mg by mouth daily.     No current facility-administered medications on file prior to visit.    Allergies  Allergen Reactions  . Penicillins     REACTION: RASH   Family  History  Problem Relation Age of Onset  . Diabetes Other        Familly Hx First degree relative  . Breast cancer Mother 85  . Breast cancer Maternal Aunt 64  . Brain cancer Maternal Uncle 58   PE: There were no vitals taken for this visit. There is no height or weight on file to calculate BMI. Wt Readings from Last 3 Encounters:  04/20/17 161 lb 4 oz (73.1 kg)  03/20/17 164 lb (74.4 kg)  01/24/17 165 lb (74.8 kg)  Constitutional: Slightly overweight, in NAD Eyes: PERRLA, EOMI, no exophthalmos ENT: moist mucous membranes, no thyromegaly, no cervical lymphadenopathy Cardiovascular: RRR, No MRG Respiratory: CTA B Gastrointestinal: abdomen soft, NT, ND, BS+ Musculoskeletal: no deformities, strength intact in all 4 Skin: moist, warm, no rashes Neurological: no tremor with outstretched hands, DTR normal in all 4 slightly  ASSESSMENT: 1. DM2, non-insulin-dependent, controlled, without lon term complications, but with hyperglycemia hyperglycemia  2. B12 deficiency  3. HL  PLAN:  1. Patient with long-standing, fairly well-controlled diabetes, on oral antidiabetic regimen.  She started weight watcher before last visit, but she was off the wagon at the time of the last visit.  She was planning to restart the diet.  We did not change the regimen at that time.  - at this visit, sugars are still high in am as she gets home from work late and eats dinner after which she goes directly to bed. Discussed for now to increase the glipizide before a larger dinner. - we can start a  GLP1 R agonist at next visit - Plan is to stop glipizide when her sugars improve, in an effort to deescalate her treatment - I suggested to:  Patient Instructions  Please continue: - Metformin 2000 mg with dinner - Invokana 100 mg daily - Januvia 100 mcg daily  - Glipizide 2.5 mg 2x a day - increase to 5 mg before a larger dinner  Please return in 3 months with your sugar log.   - today, HbA1c is 6.7% (slightly  higher) - continue checking sugars at different times of the day - check 1x a day, rotating checks - advised for yearly eye exams >> she is UTD - Return to clinic in 4 mo with sugar log   2. B12 deficiency - continues on 1000 mcg B12 daily  - Latest level was normal last summer: Lab Results  Component Value Date   VITAMINB12 782 01/24/2017  - We will continue the current dose for now  3.  HL -reviewed latest lipid panel from 01/2017: LDL at goal,Triglycerides slightly high  -Continues Zocor without side effects  Philemon Kingdom, MD PhD [facility]

## 2017-08-27 MED FILL — JANUVIA 100 MG TABLET: 100 | 30 days supply | Qty: 30 | Fill #1

## 2017-09-20 MED FILL — ACYCLOVIR 400 MG TABLET: 400 | 13 days supply | Qty: 50 | Fill #3

## 2017-09-21 ENCOUNTER — Other Ambulatory Visit: Payer: Self-pay | Admitting: Internal Medicine

## 2017-09-21 MED FILL — JANUVIA 100 MG TABLET: 100 | 30 days supply | Qty: 30 | Fill #2

## 2017-09-24 ENCOUNTER — Telehealth: Payer: Self-pay | Admitting: Internal Medicine

## 2017-09-24 ENCOUNTER — Other Ambulatory Visit: Payer: Self-pay | Admitting: Internal Medicine

## 2017-09-24 MED ORDER — ACCU-CHEK GUIDE W/DEVICE KIT: 1.0000 | PACK | Freq: Every day | 0 refills | Status: DC

## 2017-09-24 MED FILL — ACCU-CHEK GUIDE TEST STRIP: 90 days supply | Qty: 100 | Fill #0

## 2017-09-24 MED FILL — INVOKANA 100 MG TABLET: 100 | 90 days supply | Qty: 90 | Fill #0

## 2017-09-24 NOTE — Telephone Encounter (Signed)
Patient's [name] Young has been denied by [employer]. Patient needs medication that is covered by [facility]-Patient sent Dr. Cruzita Lederer a MyChart for renewal with no response. Please call patient at ph# [phone] ask for [name] Young. She needs to discuss where to go from here-is there any possibility of a way to continue the [place] or do we need to change.

## 2017-09-25 ENCOUNTER — Telehealth: Payer: Self-pay | Admitting: Internal Medicine

## 2017-09-25 NOTE — Telephone Encounter (Signed)
Spoke to patient.  She says she called earlier and left message that issue has been resolved. Med was p/u yesterday.

## 2017-09-25 NOTE — Telephone Encounter (Signed)
FYI-Patient was resolve Invokana issue-thank you

## 2017-10-10 MED FILL — SIMVASTATIN 20 MG TABLET: 20 | 90 days supply | Qty: 90 | Fill #2

## 2017-10-10 MED FILL — FLUTICASONE PROP 50 MCG SPR: 50 | 60 days supply | Qty: 16 | Fill #1

## 2017-10-10 MED FILL — DASETTA 1-35-28 TABLET: 1-35 | 84 days supply | Qty: 84 | Fill #2

## 2017-10-23 ENCOUNTER — Other Ambulatory Visit: Payer: Self-pay | Admitting: Family Medicine

## 2017-10-23 DIAGNOSIS — IMO0001 Reserved for inherently not codable concepts without codable children: Secondary | ICD-10-CM

## 2017-10-23 DIAGNOSIS — E1165 Type 2 diabetes mellitus with hyperglycemia: Principal | ICD-10-CM

## 2017-10-23 DIAGNOSIS — E119 Type 2 diabetes mellitus without complications: Secondary | ICD-10-CM

## 2017-10-23 MED FILL — metFORMIN HCL 1000 MG TABS: 1000 | 90 days supply | Qty: 180 | Fill #2

## 2017-10-23 MED FILL — JANUVIA 100 MG TABLET: 100 | 30 days supply | Qty: 30 | Fill #3

## 2017-10-24 MED FILL — glipiZIDE 10 MG TABS: 10 | 60 days supply | Qty: 60 | Fill #0

## 2017-11-19 ENCOUNTER — Ambulatory Visit: Payer: 59 | Admitting: Internal Medicine

## 2017-11-19 ENCOUNTER — Encounter: Payer: Self-pay | Admitting: Internal Medicine

## 2017-11-19 VITALS — BP 132/86 | HR 72 | Ht 66.0 in | Wt 156.8 lb

## 2017-11-19 DIAGNOSIS — E1165 Type 2 diabetes mellitus with hyperglycemia: Secondary | ICD-10-CM | POA: Diagnosis not present

## 2017-11-19 DIAGNOSIS — E782 Mixed hyperlipidemia: Secondary | ICD-10-CM | POA: Diagnosis not present

## 2017-11-19 DIAGNOSIS — IMO0001 Reserved for inherently not codable concepts without codable children: Secondary | ICD-10-CM

## 2017-11-19 DIAGNOSIS — E538 Deficiency of other specified B group vitamins: Secondary | ICD-10-CM

## 2017-11-19 LAB — POCT GLYCOSYLATED HEMOGLOBIN (HGB A1C): HEMOGLOBIN A1C: 6.7

## 2017-11-19 MED ORDER — SITAGLIPTIN PHOSPHATE 100 MG PO TABS: 100.0000 mg | ORAL_TABLET | Freq: Every day | ORAL | 3 refills | Status: DC

## 2017-11-19 MED ORDER — GLIPIZIDE 5 MG PO TABS: 5.0000 mg | ORAL_TABLET | Freq: Every day | ORAL | 3 refills | Status: DC

## 2017-11-19 MED ORDER — CANAGLIFLOZIN 100 MG PO TABS: 100.0000 mg | ORAL_TABLET | Freq: Every day | ORAL | 3 refills | Status: DC

## 2017-11-19 MED FILL — ACCU-CHEK GUIDE W/DEVICE KI: W/DEVICE | 30 days supply | Qty: 1 | Fill #0

## 2017-11-19 MED FILL — JANUVIA 100 MG TABLET: 100 | 90 days supply | Qty: 90 | Fill #0

## 2017-11-19 NOTE — Progress Notes (Signed)
Patient ID: [name] Young, female   DOB: 07/27/1970, 47 y.o.   MRN: [id number]  HPI: [name] Young is a 47 y.o.-year-old female, returning for f/u for DM2, dx in 2008 (GDM 1998), non-insulin-dependent, controlled, without long-term complications. Last visit 3 mo ago.  Since last visit, she started a green smoothie cleanse and adjusted her diet.  She still has dietary indiscretions in the form of popcorn - skinny pop, chocolate covered rice cakes but less recently, so in the last 1.5 months, her sugars improved.   DM2: Last hemoglobin A1c was: Lab Results  Component Value Date   HGBA1C 6.7 08/21/2017   HGBA1C 6.5 04/20/2017   HGBA1C 6.7 01/19/2017  Since she was dx with DM >> she lost 50 lbs and was able to come off Lantus.  Pt is on a regimen of: - Metformin 2000 mg with dinner - Invokana 100 mg daily - Januvia 100  mcg daily  - Glipizide 5 mg before dinner  Pt checks her sugars 1-2x a day: - am: 95-166, 190 >> 97-140, 158, 198, 229 - 2h after b'fast:185 >> 190 >> n/c >> 163  >> n/c - before lunch:    120 >> n/c >> 100 >> n/c - >2h after lunch: n/c >> 158 >> n/c  - before dinner: 90, 142 >> 167, 233 >> n/c >> 106-163 >> 77, 160 - 2h after dinner: 153, 219 >> n/c >> 124 >> n/c - bedtime:  N/c >> 144, 157 >> n/c  - nighttime: 58 >> n/c >> 118 >> n/c Lowest sugar was 49 (alcohol) >> 54 >> 95 >> 66 (am); she has hypoglycemia awareness in the 60s. Highest sugar was 252 >> 249.  Glucometer: Bayer  Pt's meals are: - Breakfast: Eggs and cheese + grits or omelette - Lunch: Salad or veggies or tuna salad with saltines or chicken tenders - Dinner: Fast food - Snacks: Graham crackers  - NoCKD, last BUN/creatinine:  Lab Results  Component Value Date   BUN 11 01/24/2017   CREATININE 0.77 01/24/2017   She was previously on lisinopril, then stopped.  ACR normal: Lab Results  Component Value Date   MICRALBCREAT 0.6 04/20/2017   MICRALBCREAT 0.7 03/16/2016   MICRALBCREAT 0.7  03/22/2015   MICRALBCREAT 0.7 10/27/2014   MICRALBCREAT 0.1 02/09/2014   MICRALBCREAT 0.1 01/28/2013   MICRALBCREAT 0.3 06/07/2011   MICRALBCREAT 0.2 11/29/2010   MICRALBCREAT 4.6 10/21/2009   MICRALBCREAT 11.2 09/18/2006   -+ HL; last set of lipids: Lab Results  Component Value Date   CHOL 163 01/24/2017   HDL 66.50 01/24/2017   LDLCALC 63 01/24/2017   LDLDIRECT 62.0 03/22/2015   TRIG 167.0 (H) 01/24/2017   CHOLHDL 2 01/24/2017  On Zocor - last eye exam was in 12/2016: No DR - + Numbness and tingling in her feet  Vit B12 def. - om 1000 mcg B12 + B complex   Reviewed levels: Lab Results  Component Value Date   VITAMINB12 782 01/24/2017   VITAMINB12 969 (H) 03/16/2016   VITAMINB12 503 09/13/2015   VITAMINB12 923 (H) 03/12/2015   VITAMINB12 220 12/10/2014   ROS: Constitutional: + weight loss, no fatigue, no subjective hyperthermia, no subjective hypothermia Eyes: no blurry vision, no xerophthalmia ENT: no sore throat, no nodules palpated in throat, no dysphagia, no odynophagia, no hoarseness Cardiovascular: no CP/no SOB/no palpitations/no leg swelling Respiratory: no cough/no SOB/no wheezing Gastrointestinal: no N/no V/no D/no C/no acid reflux Musculoskeletal: no muscle aches/no joint aches Skin: no rashes, no  hair loss Neurological: no tremors/+ numbness/+ tingling/no dizziness  I reviewed pt's medications, allergies, PMH, social hx, family hx, and changes were documented in the history of present illness. Otherwise, unchanged from my initial visit note.  Past Medical History:  Diagnosis Date  . Diabetes mellitus    Type II  . Family history of breast cancer   . Family history of breast cancer    No past surgical history on file. History   Social History  . Marital Status: Married    Spouse Name: N/A  . 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 Smoking status: Never Smoker   . Smokeless tobacco: Not on  file  . Alcohol Use: Socially; liquor  . Drug Use: No   Social History Narrative   Regular exercise - 2-3x a week   Current Outpatient Medications on File Prior to Visit  Medication Sig Dispense Refill  . ACCU-CHEK FASTCLIX LANCETS MISC Use to check sugars daily 200 each 5  . ACCU-CHEK GUIDE test strip USE TO CHECK SUGAR DAILY AS DIRECTED 100 each 5  . acyclovir (ZOVIRAX) 400 MG tablet TAKE 2 TABLETS BY MOUTH 2 TIMES DAILY. 50 tablet 3  . B Complex-C (SUPER B COMPLEX PO) Take 1 tablet by mouth daily.    . Blood Glucose Monitoring Suppl (ACCU-CHEK GUIDE) w/Device KIT 1 each by Does not apply route daily. 1 kit 0  . cholecalciferol (VITAMIN D) 1000 UNITS tablet Take 1,000 Units by mouth daily.    . Continuous Blood Gluc Receiver (FREESTYLE LIBRE READER) DEVI 1 Device by Does not apply route 3 (three) times daily. 1 Device 1  . Continuous Blood Gluc Sensor (FREESTYLE LIBRE SENSOR SYSTEM) MISC 1 Device by Does not apply route every 30 (thirty) days. 3 each 11  . DASETTA 1/35 tablet TAKE 1 TABLET BY MOUTH ONCE DAILY 28 tablet 11  . fluticasone (FLONASE) 50 MCG/ACT nasal spray Place 1 spray into both nostrils at bedtime. 16 g 6  . glipiZIDE (GLUCOTROL) 10 MG tablet TAKE 1/2 TABLET BY MOUTH 2 TIMES A DAY BEFORE A MEAL 60 tablet 3  . INVOKANA 100 MG TABS tablet TAKE 1 TABLET BY MOUTH ONCE DAILY 90 tablet 0  . JANUVIA 100 MG tablet TAKE 1 TABLET BY MOUTH ONCE DAILY 30 tablet 5  . metFORMIN (GLUCOPHAGE) 1000 MG tablet TAKE 1 TABLET BY MOUTH 2 TIMES DAILY WITH A MEAL. 200 tablet 4  . Multiple Vitamins-Minerals (DIASENSE MULTIVITAMIN) TABS Take 1 tablet by mouth daily.      . Omeprazole (PRILOSEC PO) Take by mouth.    . simvastatin (ZOCOR) 20 MG tablet Take 1 tablet (20 mg total) by mouth at bedtime. 90 tablet 3  . vitamin B-12 (CYANOCOBALAMIN) 1000 MCG tablet Take 1,000 mcg by mouth daily.     . vitamin C (ASCORBIC ACID) 500 MG tablet Take 500 mg by mouth daily.     No current facility-administered  medications on file prior to visit.    Allergies  Allergen Reactions  . Penicillins     REACTION: RASH   Family History  Problem Relation Age of Onset  . Diabetes Other        Familly Hx First degree relative  . Breast cancer Mother 71  . Breast cancer Maternal Aunt 64  . Brain cancer Maternal Uncle 58   PE: BP 132/86   Pulse 72   Ht '5\' 6"'  (1.676 m)   Wt  156 lb 12.8 oz (71.1 kg)   SpO2 97%   BMI 25.31 kg/m  Body mass index is 25.31 kg/m. Wt Readings from Last 3 Encounters:  11/19/17 156 lb 12.8 oz (71.1 kg)  08/21/17 169 lb 3.2 oz (76.7 kg)  04/20/17 161 lb 4 oz (73.1 kg)   Constitutional: slightly overweight, in NAD Eyes: PERRLA, EOMI, no exophthalmos ENT: moist mucous membranes, no thyromegaly, no cervical lymphadenopathy Cardiovascular: RRR, No MRG Respiratory: CTA B Gastrointestinal: abdomen soft, NT, ND, BS+ Musculoskeletal: no deformities, strength intact in all 4 Skin: moist, warm, no rashes Neurological: no tremor with outstretched hands, DTR normal in all 4  ASSESSMENT: 1. DM2, non-insulin-dependent, controlled, without lon term complications, but with hyperglycemia  2. B12 deficiency  3. HL  PLAN:  1. Patient with long standing, fairly well controlled DM, on po diabetic regimen. At last visit, her sugars were still high after dinner (late dinner) and we increased Glipizide before large/late dinners.  She is taking glipizide before all dinners now.  - She started a green smoothie cleanse and her sugars have improved in the last 1.5 months, and also as she cut down on her sweets.  She is still having a hard time resisting the cravings.  She lost 13 pounds since last visit, which is excellent! - Her sugars are at or close to goal in the morning and she seldom checks later in the day  - we can start GLP1 R agonists if needed in the future, but not needed for now - I suggested to:  Patient Instructions  Please continue: - Metformin 2000 mg with dinner -  Invokana 100 mg daily - Januvia 100  mcg daily  - Glipizide 5 mg before dinner  Please return in 4 months with your sugar log.   - today, HbA1c is 6.7% (stable) - continue checking sugars at different times of the day - check 1x a day, rotating checks - advised for yearly eye exams >> she is UTD - Return to clinic in 4 mo with sugar log   2. B12 deficiency - continues on B12 supplement - 1000 mcg daily - Latest level was normal" Lab Results  Component Value Date   VITAMINB12 782 01/24/2017  - will recheck at next visit with PCP  - she has an APE coming up  3.  HL - Reviewed latest lipid panel from 01/2017: LDL at goal, TG slightly high - Continues Zocor without side effects. - Her diet should also help improving her lipids  Philemon Kingdom, MD PhD [facility]

## 2017-11-19 NOTE — Patient Instructions (Addendum)
Please continue: - Metformin 2000 mg with dinner - Invokana 100 mg daily - Januvia 100  mcg daily  - Glipizide 5 mg before dinner  Please ask Dr. Edilia Bo to also check your B12.  Please return in 4 months with your sugar log.

## 2017-12-02 NOTE — Progress Notes (Addendum)
[facility] [address] [place], [place], [place] [address] [phone]  Date:  12/05/2017   Name:  [name] Young   DOB:  08/15/1970   MRN:  [id number]  PCP:  Darreld Mclean, MD    Chief Complaint: Annual Exam (needing b12 checked, palpitations-went away withing seconds, is EKG necessary? )   History of Present Illness:  [name] Young is a 47 y.o. very pleasant female patient who presents with the following:  Here today for a CPE History of DM, hyperlipidemia, family history of breast cancer  Pap: 9/17- HPV negative Mammo: 7/18 Labs: could use some but A1c is UTD  Immun: UTD Colon screening coming up next year.   Pt has noted an episode of palpitations- occurred 10 to 14 days ago while she was just at rest It lasted a few seconds. This is not something that she has on a regular basis No CP or SOB associated.  No LE swelling  She will get some leg cramps esp if she does 3 12 hours shifts in a row. She is wearing compression socks  Has noted superficial veins in her bilateral thighs which bother her from a cosmetic standpoint only   Lab Results  Component Value Date   HGBA1C 6.7 11/19/2017   DM managed by Cruzita Lederer, from note earlier this month: 1. Patient with long standing, fairly well controlled DM, on po diabetic regimen. At last visit, her sugars were still high after dinner (late dinner) and we increased Glipizide before large/late dinners.  She is taking glipizide before all dinners now.  - She started a green smoothie cleanse and her sugars have improved in the last 1.5 months, and also as she cut down on her sweets.  She is still having a hard time resisting the cravings.  She lost 13 pounds since last visit, which is excellent! - Her sugars are at or close to goal in the morning and she seldom checks later in the day  - we can start GLP1 R agonists if needed in the future, but not needed for now - I  suggested to:  Patient Instructions  Please continue: - Metformin 2000 mg with dinner - Invokana 100 mg daily - Januvia 100  mcg daily  - Glipizide 5 mg before dinner  Please return in 4 months with your sugar log.  - today, HbA1c is 6.7% (stable) - continue checking sugars at different times of the day - check 1x a day, rotating checks - advised for yearly eye exams >> she is UTD - Return to clinic in 4 mo with sugar log          2. B12 deficiency - continues on B12 supplement - 1000 mcg daily - Latest level was normal" RecentLabs       Lab Results  Component Value Date   VITAMINB12 782 01/24/2017    - will recheck at next visit with PCP  - she has an APE coming up 3.  HL - Reviewed latest lipid panel from 01/2017: LDL at goal, TG slightly high - Continues Zocor without side effects. - Her diet should also help improving her lipids  Wt Readings from Last 3 Encounters:  12/05/17 156 lb (70.8 kg)  11/19/17 156 lb 12.8 oz (71.1 kg)  08/21/17 169 lb 3.2 oz (76.7 kg)   She has lost weight which is great!  She did a 10 day smoothie challenge.  BP Readings from Last 3 Encounters:  12/05/17 128/78  11/19/17 132/86  08/21/17 (!) 142/78     Patient Active Problem List   Diagnosis Date Noted  . Elevated blood pressure reading 03/20/2017  . Genetic testing 06/26/2016  . Family history of breast cancer   . Type 2 diabetes mellitus with hyperglycemia ([place]) 09/13/2015  . Nonrheumatic mitral valve prolapse 03/29/2015  . Vitamin B12 deficiency 03/12/2015  . Dysplastic nevus of trunk 03/05/2012  . HSV (herpes simplex virus) infection 02/06/2012  . DUB (dysfunctional uterine bleeding) 02/06/2012  . Dysplastic nevus of lower extremity, right 12/12/2011  . Hyperlipidemia 10/23/2008  . ALLERGIC REACTION 07/10/2007    Past Medical History:  Diagnosis Date  . Diabetes mellitus    Type II  . Family history of breast cancer   . Family history of breast cancer     No  past surgical history on file.  Social History   Tobacco Use  . Smoking status: Never Smoker  . Smokeless tobacco: Never Used  Substance Use Topics  . Alcohol use: No  . Drug use: No    Family History  Problem Relation Age of Onset  . Diabetes Other        Familly Hx First degree relative  . Breast cancer Mother 65  . Breast cancer Maternal Aunt 64  . Brain cancer Maternal Uncle 58    Allergies  Allergen Reactions  . Penicillins     REACTION: RASH    Medication list has been reviewed and updated.  Current Outpatient Medications on File Prior to Visit  Medication Sig Dispense Refill  . ACCU-CHEK FASTCLIX LANCETS MISC Use to check sugars daily 200 each 5  . ACCU-CHEK GUIDE test strip USE TO CHECK SUGAR DAILY AS DIRECTED 100 each 5  . acyclovir (ZOVIRAX) 400 MG tablet TAKE 2 TABLETS BY MOUTH 2 TIMES DAILY. (Patient taking differently: TAKE 2 TABLETS BY MOUTH 2 TIMES DAILY PRN.) 50 tablet 3  . B Complex-C (SUPER B COMPLEX PO) Take 1 tablet by mouth daily.    . Blood Glucose Monitoring Suppl (ACCU-CHEK GUIDE) w/Device KIT 1 each by Does not apply route daily. 1 kit 0  . canagliflozin (INVOKANA) 100 MG TABS tablet Take 1 tablet (100 mg total) by mouth daily. 90 tablet 3  . cholecalciferol (VITAMIN D) 1000 UNITS tablet Take 1,000 Units by mouth daily.    Marland Kitchen DASETTA 1/35 tablet TAKE 1 TABLET BY MOUTH ONCE DAILY 28 tablet 11  . fluticasone (FLONASE) 50 MCG/ACT nasal spray Place 1 spray into both nostrils at bedtime. (Patient taking differently: Place 1 spray into both nostrils at bedtime as needed. ) 16 g 6  . glipiZIDE (GLUCOTROL) 5 MG tablet Take 1 tablet (5 mg total) by mouth daily. 90 tablet 3  . metFORMIN (GLUCOPHAGE) 1000 MG tablet TAKE 1 TABLET BY MOUTH 2 TIMES DAILY WITH A MEAL. 200 tablet 4  . Multiple Vitamins-Minerals (DIASENSE MULTIVITAMIN) TABS Take 1 tablet by mouth daily.      . Omeprazole (PRILOSEC PO) Take by mouth as needed.     . simvastatin (ZOCOR) 20 MG tablet  Take 1 tablet (20 mg total) by mouth at bedtime. 90 tablet 3  . sitaGLIPtin (JANUVIA) 100 MG tablet Take 1 tablet (100 mg total) by mouth daily. 90 tablet 3  . vitamin B-12 (CYANOCOBALAMIN) 1000 MCG tablet Take 1,000 mcg by mouth daily.     . vitamin C (ASCORBIC ACID) 500 MG tablet Take 500 mg by mouth daily.  No current facility-administered medications on file prior to visit.     Review of Systems:  As per HPI- otherwise negative.   Physical Examination: Vitals:   12/05/17 0919  BP: 128/78  Pulse: 78  Resp: 16  SpO2: 98%   Vitals:   12/05/17 0919  Weight: 156 lb (70.8 kg)  Height: '5\' 6"'  (1.676 m)   Body mass index is 25.18 kg/m. Ideal Body Weight: Weight in (lb) to have BMI = 25: 154.6  GEN: WDWN, NAD, Non-toxic, A & O x 3, looks well, normal weight  HEENT: Atraumatic, Normocephalic. Neck supple. No masses, No LAD.  Bilateral TM wnl, oropharynx normal.  PEERL,EOMI.   Ears and Nose: No external deformity. CV: RRR, No M/G/R. No JVD. No thrill. No extra heart sounds. PULM: CTA B, no wheezes, crackles, rhonchi. No retractions. No resp. distress. No accessory muscle use. ABD: S, NT, ND, +BS. No rebound. No HSM. EXTR: No c/c/e NEURO Normal gait.  PSYCH: Normally interactive. Conversant. Not depressed or anxious appearing.  Calm demeanor.  Few superficial spider veins on her legs which are bothersome to pt cosmetically but not painful at this time   EKG: NSR- no concerning findings Compared with EKG from 9/16; no significant changes noted except I suspect a change in machine gain settings at III and AVF Assessment and Plan: Physical exam  Type 2 diabetes mellitus without complication, without long-term current use of insulin ([place]) - Plan: Comprehensive metabolic panel  Mixed hyperlipidemia - Plan: Lipid panel, simvastatin (ZOCOR) 20 MG tablet  B12 deficiency - Plan: Vitamin B12  Screening for deficiency anemia - Plan: CBC  Palpitations - Plan: EKG  12-Lead  Borderline blood pressure - Plan: EKG 12-Lead  CPE today Labs pending and I will be in touch with her results asap She noted an episode of palpitations some days ago, now resolved, no other sx noted. EKG is benign.  Offered to set up a holter monitor but she prefers to observe for now, will let me know if any changes or other concerns   Signed Lamar Blinks, MD  Received her labs, message to pt Results for orders placed or performed in visit on 12/05/17  CBC  Result Value Ref Range   WBC 5.8 4.0 - 10.5 K/uL   RBC 4.68 3.87 - 5.11 Mil/uL   Platelets 254.0 150.0 - 400.0 K/uL   Hemoglobin 14.7 12.0 - 15.0 g/dL   HCT 43.6 36.0 - 46.0 %   MCV 93.2 78.0 - 100.0 fl   MCHC 33.7 30.0 - 36.0 g/dL   RDW 13.2 11.5 - 15.5 %  Comprehensive metabolic panel  Result Value Ref Range   Sodium 142 135 - 145 mEq/L   Potassium 4.9 3.5 - 5.1 mEq/L   Chloride 103 96 - 112 mEq/L   CO2 29 19 - 32 mEq/L   Glucose, Bld 143 (H) 70 - 99 mg/dL   BUN 13 6 - 23 mg/dL   Creatinine, Ser 0.71 0.40 - 1.20 mg/dL   Total Bilirubin 0.5 0.2 - 1.2 mg/dL   Alkaline Phosphatase 49 39 - 117 U/L   AST 22 0 - 37 U/L   ALT 30 0 - 35 U/L   Total Protein 7.6 6.0 - 8.3 g/dL   Albumin 4.6 3.5 - 5.2 g/dL   Calcium 10.2 8.4 - 10.5 mg/dL   GFR 92.84 >60.00 mL/min  Lipid panel  Result Value Ref Range   Cholesterol 191 0 - 200 mg/dL   Triglycerides 123.0 0.0 -  149.0 mg/dL   HDL 78.30 >39.00 mg/dL   VLDL 24.6 0.0 - 40.0 mg/dL   LDL Cholesterol 89 0 - 99 mg/dL   Total CHOL/HDL Ratio 2    NonHDL 113.14   Vitamin B12  Result Value Ref Range   Vitamin B-12 909 211 - 911 pg/mL

## 2017-12-05 ENCOUNTER — Encounter: Payer: Self-pay | Admitting: Family Medicine

## 2017-12-05 ENCOUNTER — Ambulatory Visit (INDEPENDENT_AMBULATORY_CARE_PROVIDER_SITE_OTHER): Payer: 59 | Admitting: Family Medicine

## 2017-12-05 VITALS — BP 128/78 | HR 78 | Resp 16 | Ht 66.0 in | Wt 156.0 lb

## 2017-12-05 DIAGNOSIS — E538 Deficiency of other specified B group vitamins: Secondary | ICD-10-CM

## 2017-12-05 DIAGNOSIS — Z0000 Encounter for general adult medical examination without abnormal findings: Secondary | ICD-10-CM | POA: Diagnosis not present

## 2017-12-05 DIAGNOSIS — Z130 Encounter for screening for diseases of the blood and blood-forming organs and certain disorders involving the immune mechanism: Secondary | ICD-10-CM | POA: Diagnosis not present

## 2017-12-05 DIAGNOSIS — E119 Type 2 diabetes mellitus without complications: Secondary | ICD-10-CM | POA: Diagnosis not present

## 2017-12-05 DIAGNOSIS — R030 Elevated blood-pressure reading, without diagnosis of hypertension: Secondary | ICD-10-CM

## 2017-12-05 DIAGNOSIS — E782 Mixed hyperlipidemia: Secondary | ICD-10-CM

## 2017-12-05 DIAGNOSIS — R002 Palpitations: Secondary | ICD-10-CM

## 2017-12-05 LAB — CBC
HCT: 43.6 % (ref 36.0–46.0)
HEMOGLOBIN: 14.7 g/dL (ref 12.0–15.0)
MCHC: 33.7 g/dL (ref 30.0–36.0)
MCV: 93.2 fl (ref 78.0–100.0)
Platelets: 254 10*3/uL (ref 150.0–400.0)
RBC: 4.68 Mil/uL (ref 3.87–5.11)
RDW: 13.2 % (ref 11.5–15.5)
WBC: 5.8 10*3/uL (ref 4.0–10.5)

## 2017-12-05 LAB — LIPID PANEL
Cholesterol: 191 mg/dL (ref 0–200)
HDL: 78.3 mg/dL (ref >39.00)
LDL Cholesterol: 89 mg/dL (ref 0–99)
NONHDL: 113.14
Total CHOL/HDL Ratio: 2
Triglycerides: 123 mg/dL (ref 0.0–149.0)
VLDL: 24.6 mg/dL (ref 0.0–40.0)

## 2017-12-05 LAB — COMPREHENSIVE METABOLIC PANEL
ALBUMIN: 4.6 g/dL (ref 3.5–5.2)
ALK PHOS: 49 U/L (ref 39–117)
ALT: 30 U/L (ref 0–35)
AST: 22 U/L (ref 0–37)
BILIRUBIN TOTAL: 0.5 mg/dL (ref 0.2–1.2)
BUN: 13 mg/dL (ref 6–23)
CO2: 29 mEq/L (ref 19–32)
Calcium: 10.2 mg/dL (ref 8.4–10.5)
Chloride: 103 mEq/L (ref 96–112)
Creatinine, Ser: 0.71 mg/dL (ref 0.40–1.20)
GFR: 92.84 mL/min (ref >60.00)
GLUCOSE: 143 mg/dL — AB (ref 70–99)
Potassium: 4.9 mEq/L (ref 3.5–5.1)
SODIUM: 142 meq/L (ref 135–145)
TOTAL PROTEIN: 7.6 g/dL (ref 6.0–8.3)

## 2017-12-05 LAB — VITAMIN B12: VITAMIN B 12: 909 pg/mL (ref 211–911)

## 2017-12-05 MED ORDER — SIMVASTATIN 20 MG PO TABS: 20.0000 mg | ORAL_TABLET | Freq: Every day | ORAL | 3 refills | Status: DC

## 2017-12-05 NOTE — Patient Instructions (Addendum)
Great to see you today!  I'll be in touch with your labs asap Let me know if any increase in frequency of your palpitations or if any chest pain or shortness of breath Please remind me after your birthday and I will order cologuard for you to screen for colon cancer Please be sure to keep up with your annual mammograms  The vascular and vein center can help with your spider veins if you want to give them a call. You will probably want to delay treatment until the cooler weather however!   Health Maintenance, Female Adopting a healthy lifestyle and getting preventive care can go a long way to promote health and wellness. Talk with your health care provider about what schedule of regular examinations is right for you. This is a good chance for you to check in with your provider about disease prevention and staying healthy. In between checkups, there are plenty of things you can do on your own. Experts have done a lot of research about which lifestyle changes and preventive measures are most likely to keep you healthy. Ask your health care provider for more information. Weight and diet Eat a healthy diet  Be sure to include plenty of vegetables, fruits, low-fat dairy products, and lean protein.  Do not eat a lot of foods high in solid fats, added sugars, or salt.  Get regular exercise. This is one of the most important things you can do for your health. ? Most adults should exercise for at least 150 minutes each week. The exercise should increase your heart rate and make you sweat (moderate-intensity exercise). ? Most adults should also do strengthening exercises at least twice a week. This is in addition to the moderate-intensity exercise.  Maintain a healthy weight  Body mass index (BMI) is a measurement that can be used to identify possible weight problems. It estimates body fat based on height and weight. Your health care provider can help determine your BMI and help you achieve or maintain a  healthy weight.  For females 72 years of age and older: ? A BMI below 18.5 is considered underweight. ? A BMI of 18.5 to 24.9 is normal. ? A BMI of 25 to 29.9 is considered overweight. ? A BMI of 30 and above is considered obese.  Watch levels of cholesterol and blood lipids  You should start having your blood tested for lipids and cholesterol at 49 years of age, then have this test every 5 years.  You may need to have your cholesterol levels checked more often if: ? Your lipid or cholesterol levels are high. ? You are older than 49 years of age. ? You are at high risk for heart disease.  Cancer screening Lung Cancer  Lung cancer screening is recommended for adults 51-66 years old who are at high risk for lung cancer because of a history of smoking.  A yearly low-dose CT scan of the lungs is recommended for people who: ? Currently smoke. ? Have quit within the past 15 years. ? Have at least a 30-pack-year history of smoking. A pack year is smoking an average of one pack of cigarettes a day for 1 year.  Yearly screening should continue until it has been 15 years since you quit.  Yearly screening should stop if you develop a health problem that would prevent you from having lung cancer treatment.  Breast Cancer  Practice breast self-awareness. This means understanding how your breasts normally appear and feel.  It also means  doing regular breast self-exams. Let your health care provider know about any changes, no matter how small.  If you are in your 20s or 30s, you should have a clinical breast exam (CBE) by a health care provider every 1-3 years as part of a regular health exam.  If you are 7 or older, have a CBE every year. Also consider having a breast X-ray (mammogram) every year.  If you have a family history of breast cancer, talk to your health care provider about genetic screening.  If you are at high risk for breast cancer, talk to your health care provider about  having an MRI and a mammogram every year.  Breast cancer gene (BRCA) assessment is recommended for women who have family members with BRCA-related cancers. BRCA-related cancers include: ? Breast. ? Ovarian. ? Tubal. ? Peritoneal cancers.  Results of the assessment will determine the need for genetic counseling and BRCA1 and BRCA2 testing.  Cervical Cancer Your health care provider may recommend that you be screened regularly for cancer of the pelvic organs (ovaries, uterus, and vagina). This screening involves a pelvic examination, including checking for microscopic changes to the surface of your cervix (Pap test). You may be encouraged to have this screening done every 3 years, beginning at age 3.  For women ages 36-65, health care providers may recommend pelvic exams and Pap testing every 3 years, or they may recommend the Pap and pelvic exam, combined with testing for human papilloma virus (HPV), every 5 years. Some types of HPV increase your risk of cervical cancer. Testing for HPV may also be done on women of any age with unclear Pap test results.  Other health care providers may not recommend any screening for nonpregnant women who are considered low risk for pelvic cancer and who do not have symptoms. Ask your health care provider if a screening pelvic exam is right for you.  If you have had past treatment for cervical cancer or a condition that could lead to cancer, you need Pap tests and screening for cancer for at least 20 years after your treatment. If Pap tests have been discontinued, your risk factors (such as having a new sexual partner) need to be reassessed to determine if screening should resume. Some women have medical problems that increase the chance of getting cervical cancer. In these cases, your health care provider may recommend more frequent screening and Pap tests.  Colorectal Cancer  This type of cancer can be detected and often prevented.  Routine colorectal cancer  screening usually begins at 49 years of age and continues through 49 years of age.  Your health care provider may recommend screening at an earlier age if you have risk factors for colon cancer.  Your health care provider may also recommend using home test kits to check for hidden blood in the stool.  A small camera at the end of a tube can be used to examine your colon directly (sigmoidoscopy or colonoscopy). This is done to check for the earliest forms of colorectal cancer.  Routine screening usually begins at age 24.  Direct examination of the colon should be repeated every 5-10 years through 49 years of age. However, you may need to be screened more often if early forms of precancerous polyps or small growths are found.  Skin Cancer  Check your skin from head to toe regularly.  Tell your health care provider about any new moles or changes in moles, especially if there is a change in a  mole's shape or color.  Also tell your health care provider if you have a mole that is larger than the size of a pencil eraser.  Always use sunscreen. Apply sunscreen liberally and repeatedly throughout the day.  Protect yourself by wearing long sleeves, pants, a wide-brimmed hat, and sunglasses whenever you are outside.  Heart disease, diabetes, and high blood pressure  High blood pressure causes heart disease and increases the risk of stroke. High blood pressure is more likely to develop in: ? People who have blood pressure in the high end of the normal range (130-139/85-89 mm Hg). ? People who are overweight or obese. ? People who are African American.  If you are 15-42 years of age, have your blood pressure checked every 3-5 years. If you are 2 years of age or older, have your blood pressure checked every year. You should have your blood pressure measured twice-once when you are at a hospital or clinic, and once when you are not at a hospital or clinic. Record the average of the two measurements.  To check your blood pressure when you are not at a hospital or clinic, you can use: ? An automated blood pressure machine at a pharmacy. ? A home blood pressure monitor.  If you are between 56 years and 57 years old, ask your health care provider if you should take aspirin to prevent strokes.  Have regular diabetes screenings. This involves taking a blood sample to check your fasting blood sugar level. ? If you are at a normal weight and have a low risk for diabetes, have this test once every three years after 49 years of age. ? If you are overweight and have a high risk for diabetes, consider being tested at a younger age or more often. Preventing infection Hepatitis B  If you have a higher risk for hepatitis B, you should be screened for this virus. You are considered at high risk for hepatitis B if: ? You were born in a country where hepatitis B is common. Ask your health care provider which countries are considered high risk. ? Your parents were born in a high-risk country, and you have not been immunized against hepatitis B (hepatitis B vaccine). ? You have HIV or AIDS. ? You use needles to inject street drugs. ? You live with someone who has hepatitis B. ? You have had sex with someone who has hepatitis B. ? You get hemodialysis treatment. ? You take certain medicines for conditions, including cancer, organ transplantation, and autoimmune conditions.  Hepatitis C  Blood testing is recommended for: ? Everyone born from [birth year] through 1967. ? Anyone with known risk factors for hepatitis C.  Sexually transmitted infections (STIs)  You should be screened for sexually transmitted infections (STIs) including gonorrhea and chlamydia if: ? You are sexually active and are younger than 49 years of age. ? You are older than 49 years of age and your health care provider tells you that you are at risk for this type of infection. ? Your sexual activity has changed since you were last screened  and you are at an increased risk for chlamydia or gonorrhea. Ask your health care provider if you are at risk.  If you do not have HIV, but are at risk, it may be recommended that you take a prescription medicine daily to prevent HIV infection. This is called pre-exposure prophylaxis (PrEP). You are considered at risk if: ? You are sexually active and do not regularly use condoms or  know the HIV status of your partner(s). ? You take drugs by injection. ? You are sexually active with a partner who has HIV.  Talk with your health care provider about whether you are at high risk of being infected with HIV. If you choose to begin PrEP, you should first be tested for HIV. You should then be tested every 3 months for as long as you are taking PrEP. Pregnancy  If you are premenopausal and you may become pregnant, ask your health care provider about preconception counseling.  If you may become pregnant, take 400 to 800 micrograms (mcg) of folic acid every day.  If you want to prevent pregnancy, talk to your health care provider about birth control (contraception). Osteoporosis and menopause  Osteoporosis is a disease in which the bones lose minerals and strength with aging. This can result in serious bone fractures. Your risk for osteoporosis can be identified using a bone density scan.  If you are 51 years of age or older, or if you are at risk for osteoporosis and fractures, ask your health care provider if you should be screened.  Ask your health care provider whether you should take a calcium or vitamin D supplement to lower your risk for osteoporosis.  Menopause may have certain physical symptoms and risks.  Hormone replacement therapy may reduce some of these symptoms and risks. Talk to your health care provider about whether hormone replacement therapy is right for you. Follow these instructions at home:  Schedule regular health, dental, and eye exams.  Stay current with your  immunizations.  Do not use any tobacco products including cigarettes, chewing tobacco, or electronic cigarettes.  If you are pregnant, do not drink alcohol.  If you are breastfeeding, limit how much and how often you drink alcohol.  Limit alcohol intake to no more than 1 drink per day for nonpregnant women. One drink equals 12 ounces of beer, 5 ounces of wine, or 1 ounces of hard liquor.  Do not use street drugs.  Do not share needles.  Ask your health care provider for help if you need support or information about quitting drugs.  Tell your health care provider if you often feel depressed.  Tell your health care provider if you have ever been abused or do not feel safe at home. This information is not intended to replace advice given to you by your health care provider. Make sure you discuss any questions you have with your health care provider. Document Released: 01/09/2011 Document Revised: 12/02/2015 Document Reviewed: 03/30/2015 Elsevier Interactive Patient Education  [employer].

## 2017-12-11 DIAGNOSIS — H524 Presbyopia: Secondary | ICD-10-CM | POA: Diagnosis not present

## 2017-12-24 MED FILL — INVOKANA 100 MG TABLET: 100 | 90 days supply | Qty: 90 | Fill #0

## 2017-12-31 MED FILL — ACCU-CHEK GUIDE TEST STRIP: 90 days supply | Qty: 100 | Fill #1

## 2017-12-31 MED FILL — DASETTA 1-35-28 TABLET: 1-35 | 84 days supply | Qty: 84 | Fill #3

## 2018-01-07 MED FILL — SIMVASTATIN 20 MG TABLET: 20 | 90 days supply | Qty: 90 | Fill #3

## 2018-01-07 MED FILL — metFORMIN HCL 1000 MG TABS: 1000 | 90 days supply | Qty: 180 | Fill #3

## 2018-01-07 MED FILL — glipiZIDE 5 MG TABS: 5 | 90 days supply | Qty: 90 | Fill #0

## 2018-01-14 MED FILL — FLUTICASONE PROP 50 MCG SPR: 50 | 60 days supply | Qty: 16 | Fill #2

## 2018-01-28 ENCOUNTER — Encounter: Payer: 59 | Admitting: Family Medicine

## 2018-02-11 ENCOUNTER — Encounter: Payer: Self-pay | Admitting: Family Medicine

## 2018-02-12 ENCOUNTER — Other Ambulatory Visit: Payer: Self-pay | Admitting: Family Medicine

## 2018-02-12 DIAGNOSIS — Z1231 Encounter for screening mammogram for malignant neoplasm of breast: Secondary | ICD-10-CM

## 2018-02-21 MED FILL — JANUVIA 100 MG TABLET: 100 | 90 days supply | Qty: 90 | Fill #1

## 2018-03-20 MED FILL — FLUTICASONE PROP 50 MCG SPR: 50 | 60 days supply | Qty: 16 | Fill #3

## 2018-03-20 MED FILL — INVOKANA 100 MG TABLET: 100 | 90 days supply | Qty: 90 | Fill #1

## 2018-03-22 ENCOUNTER — Ambulatory Visit
Admission: RE | Admit: 2018-03-22 | Discharge: 2018-03-22 | Disposition: A | Discharge status: Home / Self Care | Payer: 59 | Source: Ambulatory Visit | Attending: Family Medicine | Admitting: Family Medicine

## 2018-03-22 ENCOUNTER — Encounter: Payer: Self-pay | Admitting: Internal Medicine

## 2018-03-22 ENCOUNTER — Ambulatory Visit: Payer: 59 | Admitting: Internal Medicine

## 2018-03-22 VITALS — BP 98/60 | HR 98 | Ht 66.0 in | Wt 161.8 lb

## 2018-03-22 DIAGNOSIS — E538 Deficiency of other specified B group vitamins: Secondary | ICD-10-CM | POA: Diagnosis not present

## 2018-03-22 DIAGNOSIS — Z23 Encounter for immunization: Secondary | ICD-10-CM

## 2018-03-22 DIAGNOSIS — E1165 Type 2 diabetes mellitus with hyperglycemia: Secondary | ICD-10-CM

## 2018-03-22 DIAGNOSIS — E782 Mixed hyperlipidemia: Secondary | ICD-10-CM

## 2018-03-22 DIAGNOSIS — Z1231 Encounter for screening mammogram for malignant neoplasm of breast: Secondary | ICD-10-CM | POA: Diagnosis not present

## 2018-03-22 LAB — POCT GLYCOSYLATED HEMOGLOBIN (HGB A1C): Hemoglobin A1C: 6.9 % — AB (ref 4.0–5.6)

## 2018-03-22 MED ORDER — SEMAGLUTIDE(0.25 OR 0.5MG/DOS) 2 MG/1.5ML ~~LOC~~ SOPN: 0.5000 mg | PEN_INJECTOR | SUBCUTANEOUS | 3 refills | Status: DC

## 2018-03-22 NOTE — Addendum Note (Signed)
Addended by: Drucilla Schmidt on: 03/22/2018 04:20 PM   Modules accepted: Orders

## 2018-03-22 NOTE — Patient Instructions (Addendum)
Please continue: - Metformin 2000 mg with dinner - Invokana 100 mg daily  Please start Ozempic 0.25 mg weekly in a.m. (for example on Sunday morning) x 4 weeks, then increase to 0.5 mg weekly in a.m. if no nausea or hypoglycemia.  Stop Januvia after you start Ozempic.  Stop Glipizide.  Please return in 3-4 months with your sugar log.

## 2018-03-22 NOTE — Progress Notes (Signed)
Patient ID: [name] Young, female   DOB: 1970-11-25, 47 y.o.   MRN: [id number]  HPI: [name] Young is a 47 y.o.-year-old female, returning for f/u for DM2, dx in 2008 (GDM 1998), non-insulin-dependent, controlled, without long-term complications. Last visit 4 months ago.  Since last visit, she relaxed her diet and her sugars are higher.    DM2: Last hemoglobin A1c was: Lab Results  Component Value Date   HGBA1C 6.7 11/19/2017   HGBA1C 6.7 08/21/2017   HGBA1C 6.5 04/20/2017  Since she was dx with DM >> she lost 50 lbs and was able to come off Lantus.  Pt is on a regimen of: - Metformin 2000 mg with dinner - Invokana 100 mg daily - Januvia 100  mcg daily  - Glipizide 5 mg before dinner  Pt checks her sugars 1-2 times a day: - am: 95-166, 190 >> 97-140, 158, 198, 229 >> 81, 111-163, 169 - 2h after b'fast:185 >> 190 >> n/c >> 163  >> n/c >> 200 - before lunch:    120 >> n/c >> 100 >> n/c >> 186 - >2h after lunch: n/c >> 158 >> n/c >> 223 - before dinner:n/c >> 106-163 >> 77, 160 >> 117-134 - 2h after dinner: 153, 219 >> n/c >> 124 >> n/c >> 193 - bedtime:  N/c >> 144, 157 >> n/c >> 69, 77 - nighttime: 58 >> n/c >> 118 >> n/c Lowest sugar was 49 (alcohol) >> ... 66 (am) >> 77; she has hypoglycemia awareness in the 60s Highest sugar was 252 >> 249 >> 223.  Glucometer: Bayer  Pt's meals are: - Breakfast: Eggs or oatmeal - Lunch: Salad or veggies or tuna salad with saltines or chicken tenders - Dinner: Fast food - Snacks: Graham crackers  -No CKD, last BUN/creatinine:  Lab Results  Component Value Date   BUN 13 12/05/2017   CREATININE 0.71 12/05/2017   She was previously on lisinopril, then stopped.  ACR remains normal: Lab Results  Component Value Date   MICRALBCREAT 0.6 04/20/2017   MICRALBCREAT 0.7 03/16/2016   MICRALBCREAT 0.7 03/22/2015   MICRALBCREAT 0.7 10/27/2014   MICRALBCREAT 0.1 02/09/2014   MICRALBCREAT 0.1 01/28/2013   MICRALBCREAT 0.3 06/07/2011   MICRALBCREAT 0.2 11/29/2010   MICRALBCREAT 4.6 10/21/2009   MICRALBCREAT 11.2 09/18/2006   -+ HL; last set of lipids: Lab Results  Component Value Date   CHOL 191 12/05/2017   HDL 78.30 12/05/2017   LDLCALC 89 12/05/2017   LDLDIRECT 62.0 03/22/2015   TRIG 123.0 12/05/2017   CHOLHDL 2 12/05/2017  On Zocor - last eye exam was in Summer 2019: No DR - + Numbness and tingling in her feet  Vit B12 def. -She is taking 1000 mcg B12 + B complex daily  Reviewed vitamin B12 levels Lab Results  Component Value Date   VITAMINB12 909 12/05/2017   VITAMINB12 782 01/24/2017   VITAMINB12 969 (H) 03/16/2016   VITAMINB12 503 09/13/2015   VITAMINB12 923 (H) 03/12/2015   VITAMINB12 220 12/10/2014   ROS: Constitutional: + weight gain/no weight loss, no fatigue, no subjective hyperthermia, no subjective hypothermia Eyes: no blurry vision, no xerophthalmia ENT: no sore throat, no nodules palpated in throat, no dysphagia, no odynophagia, no hoarseness Cardiovascular: no CP/no SOB/no palpitations/no leg swelling Respiratory: no cough/no SOB/no wheezing Gastrointestinal: no N/no V/no D/no C/no acid reflux Musculoskeletal: no muscle aches/no joint aches Skin: no rashes, no hair loss Neurological: no tremors/+ numbness/n+ tingling/no dizziness  I reviewed pt's medications, allergies,  PMH, social hx, family hx, and changes were documented in the history of present illness. Otherwise, unchanged from my initial visit note.  Past Medical History:  Diagnosis Date  . Diabetes mellitus    Type II  . Family history of breast cancer   . Family history of breast cancer    No past surgical history on file. History   Social History  . Marital Status: Married    Spouse Name: N/A  . 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 Smoking status: Never Smoker   . Smokeless tobacco: Not on file  . Alcohol Use: Socially; liquor  . Drug Use: No   Social  History Narrative   Regular exercise - 2-3x a week   Current Outpatient Medications on File Prior to Visit  Medication Sig Dispense Refill  . ACCU-CHEK FASTCLIX LANCETS MISC Use to check sugars daily 200 each 5  . ACCU-CHEK GUIDE test strip USE TO CHECK SUGAR DAILY AS DIRECTED 100 each 5  . acyclovir (ZOVIRAX) 400 MG tablet TAKE 2 TABLETS BY MOUTH 2 TIMES DAILY. (Patient taking differently: TAKE 2 TABLETS BY MOUTH 2 TIMES DAILY PRN.) 50 tablet 3  . B Complex-C (SUPER B COMPLEX PO) Take 1 tablet by mouth daily.    . Blood Glucose Monitoring Suppl (ACCU-CHEK GUIDE) w/Device KIT 1 each by Does not apply route daily. 1 kit 0  . canagliflozin (INVOKANA) 100 MG TABS tablet Take 1 tablet (100 mg total) by mouth daily. 90 tablet 3  . cholecalciferol (VITAMIN D) 1000 UNITS tablet Take 1,000 Units by mouth daily.    Marland Kitchen DASETTA 1/35 tablet TAKE 1 TABLET BY MOUTH ONCE DAILY 28 tablet 11  . fluticasone (FLONASE) 50 MCG/ACT nasal spray Place 1 spray into both nostrils at bedtime. (Patient taking differently: Place 1 spray into both nostrils at bedtime as needed. ) 16 g 6  . glipiZIDE (GLUCOTROL) 5 MG tablet Take 1 tablet (5 mg total) by mouth daily. 90 tablet 3  . metFORMIN (GLUCOPHAGE) 1000 MG tablet TAKE 1 TABLET BY MOUTH 2 TIMES DAILY WITH A MEAL. 200 tablet 4  . Multiple Vitamins-Minerals (DIASENSE MULTIVITAMIN) TABS Take 1 tablet by mouth daily.      . Omeprazole (PRILOSEC PO) Take by mouth as needed.     . simvastatin (ZOCOR) 20 MG tablet Take 1 tablet (20 mg total) by mouth at bedtime. 90 tablet 3  . sitaGLIPtin (JANUVIA) 100 MG tablet Take 1 tablet (100 mg total) by mouth daily. 90 tablet 3  . vitamin B-12 (CYANOCOBALAMIN) 1000 MCG tablet Take 1,000 mcg by mouth daily.     . vitamin C (ASCORBIC ACID) 500 MG tablet Take 500 mg by mouth daily.     No current facility-administered medications on file prior to visit.    Allergies  Allergen Reactions  . Penicillins     REACTION: RASH   Family  History  Problem Relation Age of Onset  . Diabetes Other        Familly Hx First degree relative  . Breast cancer Mother 72  . Breast cancer Maternal Aunt 64  . Brain cancer Maternal Uncle 58   PE: BP 98/60   Pulse 98   Ht '5\' 6"'  (1.676 m)   Wt 161 lb 12.8 oz (73.4 kg)   LMP 02/19/2018   SpO2 98%   BMI 26.12 kg/m  Body mass index is 26.12 kg/m. Wt Readings from Last 3  Encounters:  03/22/18 161 lb 12.8 oz (73.4 kg)  12/05/17 156 lb (70.8 kg)  11/19/17 156 lb 12.8 oz (71.1 kg)   Constitutional: Normal weight, in NAD Eyes: PERRLA, EOMI, no exophthalmos ENT: moist mucous membranes, no thyromegaly, no cervical lymphadenopathy Cardiovascular: tachycardia, RR, No MRG Respiratory: CTA B Gastrointestinal: abdomen soft, NT, ND, BS+ Musculoskeletal: no deformities, strength intact in all 4 Skin: moist, warm, no rashes Neurological: no tremor with outstretched hands, DTR normal in all 4  ASSESSMENT: 1. DM2, non-insulin-dependent, controlled, without lon term complications, but with hyperglycemia  2. B12 deficiency  3. HL  PLAN:  1. Patient with long-standing, fairly well-controlled diabetes, on p.o. diabetic regimen.  We initially added glipizide to take only before late or large dinners, but she is taking it now before all dinners.  Before last visit, she started a green smoothie cleanse and her sugars have improved.  She also cut down on her sweets.  She was still having problems resisting the cravings.  However, she lost 13 pounds before last visit!  Her sugars were at or close to goal in the morning but she seldom checked later in the day.  HbA1c was at goal, is 6.7%. - since last visit, she gained 5 lbs back.  She is still including starting her diet, for example she eats eggs in the morning.  We discussed that higher fat foods will perpetuate her insulin resistance. - At this visit, sugars are also higher, but mostly with hyperglycemic spikes. - At this visit, we discussed about  stopping Januvia and started GLP-1 receptor agonist.  We discussed about Ozempic and I advised her about benefits and possible side effects.  She has no history of pancreatitis or family history of medullary thyroid cancer.  We will started a low dose and advance as tolerated. - She had 2 blood sugars that were low after dinner, so I advised her to stop glipizide when she starts Ozempic.  We will also stop Januvia. - I suggested to:  Patient Instructions  Please continue: - Metformin 2000 mg with dinner - Invokana 100 mg daily  Please start Ozempic 0.25 mg weekly in a.m. (for example on Sunday morning) x 4 weeks, then increase to 0.5 mg weekly in a.m. if no nausea or hypoglycemia.  Stop Januvia after you start Ozempic.  Stop Glipizide.  Please return in 3-4 months with your sugar log.  Not know none  - today, HbA1c is 6.9% (worse) - continue checking sugars at different times of the day - check 1x a day, rotating checks - advised for yearly eye exams >> she is UTD - Return to clinic in 4 mo with sugar log    2. B12 deficiency -Continues on 1000 mcg B12 supplement daily + B complex - Latest level was normal 4 months ago: Lab Results  Component Value Date   VITAMINB12 909 12/05/2017   3.  HL - Reviewed latest lipid panel from 11/2017: LDL at goal, slightly worse compared to before, the rest of the fractions are also at goal. Lab Results  Component Value Date   CHOL 191 12/05/2017   HDL 78.30 12/05/2017   LDLCALC 89 12/05/2017   LDLDIRECT 62.0 03/22/2015   TRIG 123.0 12/05/2017   CHOLHDL 2 12/05/2017  - Continues Zocor without side effects.  Philemon Kingdom, MD PhD [facility] Endocrinology

## 2018-03-25 ENCOUNTER — Telehealth: Payer: Self-pay

## 2018-03-25 MED ORDER — DULAGLUTIDE 0.75 MG/0.5ML ~~LOC~~ SOAJ: 0.7500 mg | SUBCUTANEOUS | 0 refills | Status: DC

## 2018-03-25 MED FILL — TRULICITY 0.75 MG/0.5 ML PE: 0.75 | 28 days supply | Qty: 2 | Fill #0

## 2018-03-25 NOTE — Telephone Encounter (Signed)
Sent!

## 2018-03-25 NOTE — Telephone Encounter (Signed)
Ozempic requires PA unless patient has had a trial of Bydureon, Byetta, or Trulicity and Metformin combo within the last 365 Days.  Please advise if PA is needed.

## 2018-03-25 NOTE — Telephone Encounter (Signed)
Let's sent Trulicity 9.51 mg pens x 4.  When she is close to running out of the pens, she needs to let us know so we can send the higher dose pens, of 1.5 mg.  We can work with Trulicity just fine.

## 2018-03-25 NOTE — Addendum Note (Signed)
Addended by: Cardell Peach I on: 03/25/2018 03:59 PM   Modules accepted: Orders

## 2018-03-26 ENCOUNTER — Other Ambulatory Visit: Payer: Self-pay | Admitting: Family Medicine

## 2018-03-27 MED FILL — ALYACEN 1-35-28 TABLET: 1-35 | 84 days supply | Qty: 84 | Fill #0

## 2018-04-10 MED FILL — SIMVASTATIN 20 MG TABLET: 20 | 90 days supply | Qty: 90 | Fill #0

## 2018-04-22 ENCOUNTER — Other Ambulatory Visit: Payer: Self-pay | Admitting: Internal Medicine

## 2018-04-22 MED FILL — TRULICITY 0.75 MG/0.5 ML PE: 0.75 | 28 days supply | Qty: 2 | Fill #0

## 2018-04-26 MED FILL — ACCU-CHEK GUIDE TEST STRIP: 90 days supply | Qty: 100 | Fill #2

## 2018-05-02 MED FILL — metFORMIN HCL 1000 MG TABS: 1000 | 90 days supply | Qty: 180 | Fill #4

## 2018-05-07 MED FILL — FLUTICASONE PROP 50 MCG SPR: 50 | 60 days supply | Qty: 16 | Fill #4

## 2018-05-20 ENCOUNTER — Other Ambulatory Visit: Payer: Self-pay | Admitting: Family Medicine

## 2018-05-21 MED FILL — ACYCLOVIR 400 MG TABLET: 400 | 13 days supply | Qty: 50 | Fill #0

## 2018-05-22 MED FILL — glipiZIDE 5 MG TABS: 5 | 90 days supply | Qty: 90 | Fill #1

## 2018-05-27 ENCOUNTER — Other Ambulatory Visit: Payer: Self-pay | Admitting: Internal Medicine

## 2018-05-27 MED FILL — TRULICITY 0.75 MG/0.5 ML PE: 0.75 | 28 days supply | Qty: 2 | Fill #0

## 2018-06-18 ENCOUNTER — Other Ambulatory Visit: Payer: Self-pay | Admitting: Internal Medicine

## 2018-06-18 MED FILL — TRULICITY 0.75 MG/0.5 ML PE: 0.75 | 28 days supply | Qty: 2 | Fill #0

## 2018-06-18 MED FILL — INVOKANA 100 MG TABLET: 100 | 90 days supply | Qty: 90 | Fill #2

## 2018-06-18 MED FILL — ALYACEN 1-35-28 TABLET: 1-35 | 84 days supply | Qty: 84 | Fill #1

## 2018-06-26 MED FILL — ACYCLOVIR 400 MG TABLET: 400 | 13 days supply | Qty: 50 | Fill #1

## 2018-07-08 MED FILL — SIMVASTATIN 20 MG TABLET: 20 | 90 days supply | Qty: 90 | Fill #1

## 2018-07-18 ENCOUNTER — Other Ambulatory Visit: Payer: Self-pay | Admitting: Internal Medicine

## 2018-07-18 MED FILL — TRULICITY 0.75 MG/0.5 ML PE: 0.75 | 28 days supply | Qty: 2 | Fill #0

## 2018-07-23 ENCOUNTER — Ambulatory Visit: Payer: 59 | Admitting: Internal Medicine

## 2018-07-26 ENCOUNTER — Telehealth: Payer: 59 | Admitting: Nurse Practitioner

## 2018-07-26 DIAGNOSIS — H5711 Ocular pain, right eye: Secondary | ICD-10-CM

## 2018-07-26 NOTE — Progress Notes (Signed)
Based on what you shared with me it looks like you do not have pink eye. And that should be evaluated in a face to face office visit. This could be something very serious. Pink eye usually has drainage and lashes matted together with only slight pain. Need to be evaluated face to face.   NOTE: If you entered your credit card information for this eVisit, you will not be charged. You may see a "hold" on your card for the $30 but that hold will drop off and you will not have a charge processed.  If you are having a true medical emergency please call 911.  If you need an urgent face to face visit, [facility] has four urgent care centers for your convenience.  If you need care fast and have a high deductible or no insurance consider:   [email or web address] to reserve your spot online an avoid wait times  [facility] [address], [address] [place], [place] [address] 8 am to 8 pm Monday-Friday 10 am to 4 pm Saturday-Sunday *Across the street from [employer]  [place], [address] 8 am to 5 pm Monday-Friday * In the [facility] on the [facility]   hatThe following sites will take your  insurance:  . [facility] [place] a Provider at this Location  [address] [place], [place] [address] . 10 am to 8 pm Monday-Friday . 12 pm to 8 pm Saturday-Sunday   . [facility] [place] a Provider at this Location  [address], [place], [place] [address] . 8 am to 8 pm Monday-Friday . 9 am to 6 pm Saturday . 11 am to 6 pm Sunday   . [facility] [place] Get Driving Directions  [address] [place], [place] [address] . 8 am to 8 pm Monday-Friday . 8 am to 4 pm Saturday-Sunday   Your e-visit answers were reviewed by a board  certified advanced clinical practitioner to complete your personal care plan.

## 2018-07-27 DIAGNOSIS — H1031 Unspecified acute conjunctivitis, right eye: Secondary | ICD-10-CM | POA: Diagnosis not present

## 2018-08-05 ENCOUNTER — Other Ambulatory Visit: Payer: Self-pay | Admitting: Family Medicine

## 2018-08-05 DIAGNOSIS — T7840XD Allergy, unspecified, subsequent encounter: Secondary | ICD-10-CM

## 2018-08-05 DIAGNOSIS — E119 Type 2 diabetes mellitus without complications: Secondary | ICD-10-CM

## 2018-08-06 MED FILL — metFORMIN HCL 1000 MG TABS: 1000 | 90 days supply | Qty: 180 | Fill #0

## 2018-08-06 MED FILL — FLUTICASONE PROP 50 MCG SPR: 50 | 60 days supply | Qty: 16 | Fill #0

## 2018-08-07 MED FILL — ACYCLOVIR 400 MG TABLET: 400 | 13 days supply | Qty: 50 | Fill #2

## 2018-08-12 ENCOUNTER — Other Ambulatory Visit: Payer: Self-pay | Admitting: Internal Medicine

## 2018-08-12 MED FILL — glipiZIDE 5 MG TABS: 5 | 90 days supply | Qty: 90 | Fill #2 | Status: TO

## 2018-08-12 MED FILL — TRULICITY 0.75 MG/0.5 ML PE: 0.75 | 28 days supply | Qty: 2 | Fill #0

## 2018-08-22 MED FILL — ACCU-CHEK GUIDE TEST STRIP: 90 days supply | Qty: 100 | Fill #3

## 2018-09-10 ENCOUNTER — Encounter: Payer: Self-pay | Admitting: Family Medicine

## 2018-09-10 ENCOUNTER — Ambulatory Visit
Admission: EM | Admit: 2018-09-10 | Discharge: 2018-09-10 | Disposition: A | Discharge status: Home / Self Care | Payer: 59 | Attending: Physician Assistant | Admitting: Physician Assistant

## 2018-09-10 ENCOUNTER — Ambulatory Visit: Payer: Self-pay

## 2018-09-10 DIAGNOSIS — R05 Cough: Secondary | ICD-10-CM

## 2018-09-10 DIAGNOSIS — J3489 Other specified disorders of nose and nasal sinuses: Secondary | ICD-10-CM

## 2018-09-10 DIAGNOSIS — R6889 Other general symptoms and signs: Secondary | ICD-10-CM

## 2018-09-10 DIAGNOSIS — R0981 Nasal congestion: Secondary | ICD-10-CM | POA: Diagnosis not present

## 2018-09-10 MED ORDER — OSELTAMIVIR PHOSPHATE 75 MG PO CAPS: 75.0000 mg | ORAL_CAPSULE | Freq: Two times a day (BID) | ORAL | 0 refills | Status: DC

## 2018-09-10 MED FILL — OSELTAMIVIR PHOSPHATE 75 MG: 75 | 5 days supply | Qty: 10 | Fill #0

## 2018-09-10 NOTE — Discharge Instructions (Signed)
Start tamiflu as directed. You can use over the counter medicine such as flonase, zyrtec for nasal congestion/drainage. You can use over the counter nasal saline rinse such as neti pot for nasal congestion. Keep hydrated, your urine should be clear to pale yellow in color. Tylenol/motrin for fever and pain. Monitor for any worsening of symptoms, chest pain, shortness of breath, wheezing, swelling of the throat, follow up for reevaluation.

## 2018-09-10 NOTE — Telephone Encounter (Signed)
Pt was instructed via MyChart that she would need appt since she is having symptoms.

## 2018-09-10 NOTE — Telephone Encounter (Signed)
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 She says her patient was diagnosed with the flu and this morning she started having symptoms at work, body aches, temp 99.8, slight cough before the symptoms started. She says she doesn't want to come in and get swabbed, because she knows what it is. She says many of her co-workers are out with the same symptoms. She's asking if Dr. Larose Kells can prescribe Tamiflu for her. I advised I will send over to Dr. Larose Kells and someone will call with his recommendation.  Pharmacy:  [place], [place] - [address] [phone] (Phone) [phone] (Fax)    Reason for Disposition . 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 TYPE of EXPOSURE: "How were you exposed?" (e.g., close contact, not a close contact)     Patient contact 2. DATE of EXPOSURE: "When did the exposure occur?" (e.g., hour, days, weeks)     Saturday and Sunday 3. PREGNANCY: "Is there any chance you are pregnant?" "When was your last menstrual period?"     No 4. HIGH RISK for COMPLICATIONS: "Do you have any heart or lung problems? Do you have a weakened immune system?" (e.g., CHF, COPD, asthma, HIV positive, chemotherapy, renal failure, diabetes mellitus, sickle cell anemia)     Diabetes 5. SYMPTOMS: "Do you have any symptoms?" (e.g., cough, fever, sore throat, difficulty breathing).     Body aches, fever up to 99.8, little cough with phlegm  Protocols used: INFLUENZA EXPOSURE-A-AH

## 2018-09-10 NOTE — Telephone Encounter (Signed)
Patient called and advised of the MyChart message, she says that she would like to speak to someone in the office after advising no appointments available today. I called and spoke to [facility], [facility] and she asked if the patient would agree to another [facility] location. I asked the patient and she says she would just go to an urgent care that's close to her home, because she doesn't want to go to another office.

## 2018-09-10 NOTE — ED Triage Notes (Signed)
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 Now c/o fever, body aches and headache

## 2018-09-10 NOTE — ED Provider Notes (Signed)
[facility]    CSN: [id number] Arrival date & time: 09/10/18  1331     History   Chief Complaint Chief Complaint  Patient presents with  . Influenza    HPI [name] Young is a 48 y.o. female.   48 year old female with history of diabetes comes in for 1 day history of body aches, chills, subjective fever.  Patient has had 1 week history of URI symptoms including cough, congestion, rhinorrhea.  However, states the symptoms had been improving prior to current symptom onset.  States T-max 99.8.  She had positive flu contact yesterday.  Has continued OTC cold medications with mild relief.  Has had a flu shot this year.     Past Medical History:  Diagnosis Date  . Diabetes mellitus    Type II  . Family history of breast cancer   . Family history of breast cancer     Patient Active Problem List   Diagnosis Date Noted  . Elevated blood pressure reading 03/20/2017  . Genetic testing 06/26/2016  . Family history of breast cancer   . Type 2 diabetes mellitus with hyperglycemia ([place]) 09/13/2015  . Nonrheumatic mitral valve prolapse 03/29/2015  . Vitamin B12 deficiency 03/12/2015  . Dysplastic nevus of trunk 03/05/2012  . HSV (herpes simplex virus) infection 02/06/2012  . DUB (dysfunctional uterine bleeding) 02/06/2012  . Dysplastic nevus of lower extremity, right 12/12/2011  . Hyperlipidemia 10/23/2008  . ALLERGIC REACTION 07/10/2007    History reviewed. No pertinent surgical history.  OB History    Gravida  1   Para  1   Term      Preterm      AB      Living        SAB      TAB      Ectopic      Multiple      Live Births               Home Medications    Prior to Admission medications   Medication Sig Start Date End Date Taking? Authorizing Provider  ACCU-CHEK FASTCLIX LANCETS MISC Use to check sugars daily 09/13/16   Philemon Kingdom, MD  ACCU-CHEK GUIDE test strip USE TO CHECK SUGAR DAILY AS DIRECTED 09/24/17   Philemon Kingdom,  MD  acyclovir (ZOVIRAX) 400 MG tablet TAKE 2 TABLETS BY MOUTH 2 TIMES DAILY PRN. 05/21/18   Copland, Gay Filler, MD  B Complex-C (SUPER B COMPLEX PO) Take 1 tablet by mouth daily.    [provider]  Blood Glucose Monitoring Suppl (ACCU-CHEK GUIDE) w/Device KIT 1 each by Does not apply route daily. 09/24/17   Philemon Kingdom, MD  canagliflozin (INVOKANA) 100 MG TABS tablet Take 1 tablet (100 mg total) by mouth daily. 11/19/17   Philemon Kingdom, MD  cholecalciferol (VITAMIN D) 1000 UNITS tablet Take 1,000 Units by mouth daily.    [provider]  DASETTA 1/35 tablet TAKE 1 TABLET BY MOUTH ONCE DAILY 03/27/18   Copland, Gay Filler, MD  fluticasone (FLONASE) 50 MCG/ACT nasal spray PLACE 1 SPRAY INTO BOTH NOSTRILS AT BEDTIME 08/06/18   Copland, Gay Filler, MD  glipiZIDE (GLUCOTROL) 5 MG tablet Take 1 tablet (5 mg total) by mouth daily. 11/19/17   Philemon Kingdom, MD  metFORMIN (GLUCOPHAGE) 1000 MG tablet TAKE 1 TABLET BY MOUTH 2 TIMES DAILY WITH A MEAL. 08/06/18   Copland, Gay Filler, MD  Multiple Vitamins-Minerals (DIASENSE MULTIVITAMIN) TABS Take 1 tablet by mouth daily.  [provider]  Omeprazole (PRILOSEC PO) Take by mouth as needed.     [provider]  oseltamivir (TAMIFLU) 75 MG capsule Take 1 capsule (75 mg total) by mouth every 12 (twelve) hours. 09/10/18   Tasia Catchings,  V, PA-C  simvastatin (ZOCOR) 20 MG tablet Take 1 tablet (20 mg total) by mouth at bedtime. 12/05/17   Copland, Gay Filler, MD  sitaGLIPtin (JANUVIA) 100 MG tablet Take 1 tablet (100 mg total) by mouth daily. 11/19/17   Philemon Kingdom, MD  TRULICITY 5.03 TW/6.5KC SOPN INJECT 0.75 MG INTO THE SKIN ONCE A WEEK AS DIRECTED 08/12/18   Philemon Kingdom, MD  vitamin B-12 (CYANOCOBALAMIN) 1000 MCG tablet Take 1,000 mcg by mouth daily.     [provider]  vitamin C (ASCORBIC ACID) 500 MG tablet Take 500 mg by mouth daily.    [provider]    Family History Family History  Problem  Relation Age of Onset  . Diabetes Other        Familly Hx First degree relative  . Breast cancer Mother 71  . Breast cancer Maternal Aunt 64  . Brain cancer Maternal Uncle 82    Social History Social History   Tobacco Use  . Smoking status: Never Smoker  . Smokeless tobacco: Never Used  Substance Use Topics  . Alcohol use: No  . Drug use: No     Allergies   Penicillins   Review of Systems Review of Systems  Reason unable to perform ROS: See HPI as above.     Physical Exam Triage Vital Signs ED Triage Vitals [09/10/18 1346]  Enc Vitals Group     BP 128/88     Pulse Rate (!) 117     Resp 18     Temp 98.6 F (37 C)     Temp Source Oral     SpO2 98 %     Weight      Height      Head Circumference      Peak Flow      Pain Score 0     Pain Loc      Pain Edu?      Excl. in [place]?    No data found.  Updated Vital Signs BP 128/88 (BP Location: Left Arm)   Pulse (!) 117   Temp 98.6 F (37 C) (Oral)   Resp 18   LMP 08/07/2018   SpO2 98%   Physical Exam Constitutional:      General: She is not in acute distress.    Appearance: She is well-developed. She is not ill-appearing, toxic-appearing or diaphoretic.  HENT:     Head: Normocephalic and atraumatic.     Right Ear: Tympanic membrane, ear canal and external ear normal. Tympanic membrane is not erythematous or bulging.     Left Ear: Tympanic membrane, ear canal and external ear normal. Tympanic membrane is not erythematous or bulging.     Nose: Nose normal.     Right Sinus: No maxillary sinus tenderness or frontal sinus tenderness.     Left Sinus: No maxillary sinus tenderness or frontal sinus tenderness.     Mouth/Throat:     Mouth: Mucous membranes are moist.     Pharynx: Oropharynx is clear. Uvula midline.  Eyes:     Conjunctiva/sclera: Conjunctivae normal.     Pupils: Pupils are equal, round, and reactive to light.  Neck:     Musculoskeletal: Normal range of motion and neck supple.  Cardiovascular:  Rate and Rhythm: Normal rate and regular rhythm.     Heart sounds: Normal heart sounds. No murmur. No friction rub. No gallop.   Pulmonary:     Effort: Pulmonary effort is normal. No accessory muscle usage, prolonged expiration, respiratory distress or retractions.     Breath sounds: Normal breath sounds. No stridor, decreased air movement or transmitted upper airway sounds. No decreased breath sounds, wheezing, rhonchi or rales.  Skin:    General: Skin is warm and dry.  Neurological:     Mental Status: She is alert and oriented to person, place, and time.      UC Treatments / Results  Labs (all labs ordered are listed, but only abnormal results are displayed) Labs Reviewed - No data to display  EKG None  Radiology No results found.  Procedures Procedures (including critical care time)  Medications Ordered in UC Medications - No data to display  Initial Impression / Assessment and Plan / UC Course  I have reviewed the triage vital signs and the nursing notes.  Pertinent labs & imaging results that were available during my care of the patient were reviewed by me and considered in my medical decision making (see chart for details).    Lungs clear to auscultation bilaterally without adventitious lung sounds, lower suspicion for pneumonia. Recheck HR slightly improved at 109. Discussed rapid flu test versus empiric treatment of the flu.  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  Tamiflu as directed.  Continue other symptomatic treatment.  Push fluids.  Return precautions given.  Patient expresses understanding and agrees to plan.  Final Clinical Impressions(s) / UC Diagnoses   Final diagnoses:  Flu-like symptoms    ED Prescriptions    Medication Sig Dispense Auth. Provider   oseltamivir (TAMIFLU) 75 MG capsule Take 1 capsule (75 mg total) by mouth every 12 (twelve) hours. 10 capsule Tobin Chad, [place] 09/10/18  1418

## 2018-09-16 ENCOUNTER — Other Ambulatory Visit: Payer: Self-pay | Admitting: Internal Medicine

## 2018-09-16 MED FILL — ALYACEN 1-35-28 TABLET: 1-35 | 84 days supply | Qty: 84 | Fill #2 | Status: TO

## 2018-09-16 MED FILL — TRULICITY 0.75 MG/0.5 ML PE: 0.75 | 28 days supply | Qty: 2 | Fill #0

## 2018-09-18 MED FILL — INVOKANA 100 MG TABLET: 100 | 90 days supply | Qty: 90 | Fill #3

## 2018-09-19 ENCOUNTER — Other Ambulatory Visit: Payer: Self-pay | Admitting: Internal Medicine

## 2018-10-07 MED FILL — SIMVASTATIN 20 MG TABLET: 20 | 90 days supply | Qty: 90 | Fill #0

## 2018-10-07 MED FILL — TRULICITY 0.75 MG/0.5 ML PE: 0.75 | 28 days supply | Qty: 2 | Fill #0

## 2018-10-21 ENCOUNTER — Other Ambulatory Visit: Payer: Self-pay

## 2018-10-22 ENCOUNTER — Ambulatory Visit: Payer: 59 | Admitting: Internal Medicine

## 2018-10-22 ENCOUNTER — Encounter: Payer: Self-pay | Admitting: Internal Medicine

## 2018-10-22 VITALS — BP 120/80 | HR 100 | Temp 97.8°F | Ht 66.0 in | Wt 155.0 lb

## 2018-10-22 DIAGNOSIS — E538 Deficiency of other specified B group vitamins: Secondary | ICD-10-CM | POA: Diagnosis not present

## 2018-10-22 DIAGNOSIS — E1165 Type 2 diabetes mellitus with hyperglycemia: Secondary | ICD-10-CM

## 2018-10-22 DIAGNOSIS — E782 Mixed hyperlipidemia: Secondary | ICD-10-CM

## 2018-10-22 LAB — COMPLETE METABOLIC PANEL WITH GFR
AG Ratio: 1.8 (calc) (ref 1.0–2.5)
ALT: 23 U/L (ref 6–29)
AST: 18 U/L (ref 10–35)
Albumin: 4.4 g/dL (ref 3.6–5.1)
Alkaline phosphatase (APISO): 55 U/L (ref 37–153)
BUN: 18 mg/dL (ref 7–25)
CO2: 27 mmol/L (ref 20–32)
Calcium: 9.8 mg/dL (ref 8.6–10.4)
Chloride: 101 mmol/L (ref 98–110)
Creat: 0.76 mg/dL (ref 0.50–1.05)
GFR, Est African American: 106 mL/min/{1.73_m2} (ref >=60)
GFR, Est Non African American: 91 mL/min/{1.73_m2} (ref >=60)
Globulin: 2.5 g/dL (calc) (ref 1.9–3.7)
Glucose, Bld: 193 mg/dL — ABNORMAL HIGH (ref 65–99)
Potassium: 4.2 mmol/L (ref 3.5–5.3)
Sodium: 139 mmol/L (ref 135–146)
Total Bilirubin: 0.6 mg/dL (ref 0.2–1.2)
Total Protein: 6.9 g/dL (ref 6.1–8.1)

## 2018-10-22 LAB — LIPID PANEL
Cholesterol: 158 mg/dL (ref 0–200)
HDL: 65 mg/dL (ref >39.00)
LDL Cholesterol: 59 mg/dL (ref 0–99)
NonHDL: 93.28
Total CHOL/HDL Ratio: 2
Triglycerides: 172 mg/dL — ABNORMAL HIGH (ref 0.0–149.0)
VLDL: 34.4 mg/dL (ref 0.0–40.0)

## 2018-10-22 LAB — POCT GLYCOSYLATED HEMOGLOBIN (HGB A1C): Hemoglobin A1C: 7.6 % — AB (ref 4.0–5.6)

## 2018-10-22 LAB — MICROALBUMIN / CREATININE URINE RATIO
Creatinine,U: 87.2 mg/dL
Microalb Creat Ratio: 0.8 mg/g (ref 0.0–30.0)
Microalb, Ur: <0.7 mg/dL (ref 0.0–1.9)

## 2018-10-22 LAB — VITAMIN B12: Vitamin B-12: 890 pg/mL (ref 211–911)

## 2018-10-22 MED ORDER — DULAGLUTIDE 1.5 MG/0.5ML ~~LOC~~ SOAJ: 1.5000 mg | SUBCUTANEOUS | 11 refills | Status: DC

## 2018-10-22 MED FILL — TRULICITY 1.5 MG/0.5 ML PEN: 1.5 | 28 days supply | Qty: 2 | Fill #0

## 2018-10-22 NOTE — Patient Instructions (Addendum)
Please continue: - Metformin 2000 mg with dinner - Invokana 100 mg daily - Glipizide 5 mg before dinner  Please increase: - Trulicity 1.5 mg weekly  Please return in 3 to 4 months with your sugar log.

## 2018-10-22 NOTE — Progress Notes (Signed)
Patient ID: [name] Young, female   DOB: Aug 20, 1970, 48 y.o.   MRN: [id number]  HPI: [name] Young is a 48 y.o.-year-old female, returning for f/u for DM2, dx in 2008 (GDM 1998), non-insulin-dependent, controlled, without long-term complications. Last visit 7 months ago.  She had flu-like sxs 09/2018 - tx'ed with Tamiflu >> resolved.   DM2: Last hemoglobin A1c was: Lab Results  Component Value Date   HGBA1C 6.9 (A) 03/22/2018   HGBA1C 6.7 11/19/2017   HGBA1C 6.7 08/21/2017  Since she was dx with DM >> she lost 50 lbs and was able to come off Lantus.  Pt was on a regimen of: - Metformin 2000 mg with dinner - Invokana 100 mg daily - Januvia 100  mcg daily  - Glipizide 5 mg before dinner  At last visit, we changed to: - Metformin 2000 mg with dinner - Invokana 100 mg daily - Glipizide 5 mg before dinner - she did not stop this at last OV - Trulicity 5.62 mg weekly - she did not contact me after 4 weeks to increase the dose. Tolerates the low dose well.  Pt checks her sugars 1-2 times a day: - am: 97-140, 158, 198, 229 >> 81, 111-163, 169 >> 109-175, 191 - 2h after b'fast:n/c >> 163  >> n/c >> 200 >> 205 - before lunch:   100 >> n/c >> 186 >> 134 - >2h after lunch: n/c >> 158 >> n/c >> 223 >> n/c - before dinner:106-163 >> 77, 160 >> 117-134 >> 136 - 2h after dinner: n/c >> 124 >> n/c >> 193 >> n/c - bedtime: 144, 157 >> n/c >> 69, 77 >> n/c - nighttime: 58 >> n/c >> 118 >> n/c Lowest sugar was 49 (alcohol) >> ... 66 (am) >> 77 >> 109; she has hypoglycemia awareness in the 60s. Highest sugar was 223 >> 205.  Glucometer: Bayer  Pt's meals are: - Breakfast: Eggs or oatmeal - Lunch: Salad or veggies or tuna salad with saltines or chicken tenders - Dinner: Fast food - Snacks: Graham crackers  -No CKD, last BUN/creatinine:  Lab Results  Component Value Date   BUN 13 12/05/2017   CREATININE 0.71 12/05/2017   She was previously on lisinopril, then stopped.  ACR remained  normal: Lab Results  Component Value Date   MICRALBCREAT 0.6 04/20/2017   MICRALBCREAT 0.7 03/16/2016   MICRALBCREAT 0.7 03/22/2015   MICRALBCREAT 0.7 10/27/2014   MICRALBCREAT 0.1 02/09/2014   MICRALBCREAT 0.1 01/28/2013   MICRALBCREAT 0.3 06/07/2011   MICRALBCREAT 0.2 11/29/2010   MICRALBCREAT 4.6 10/21/2009   MICRALBCREAT 11.2 09/18/2006   -+ HL; last set of lipids: Lab Results  Component Value Date   CHOL 191 12/05/2017   HDL 78.30 12/05/2017   LDLCALC 89 12/05/2017   LDLDIRECT 62.0 03/22/2015   TRIG 123.0 12/05/2017   CHOLHDL 2 12/05/2017  On Zocor. - last eye exam was in summer 2019: No DR -+ Numbness and tingling in her feet  Vit B12 def. -She takes 1000 mcg B12 + B complex daily  Reviewed vitamin B12 levels: Lab Results  Component Value Date   VITAMINB12 909 12/05/2017   VITAMINB12 782 01/24/2017   VITAMINB12 969 (H) 03/16/2016   VITAMINB12 503 09/13/2015   VITAMINB12 923 (H) 03/12/2015   VITAMINB12 220 12/10/2014   ROS: Constitutional: no weight gain/+ weight loss, no fatigue, no subjective hyperthermia, no subjective hypothermia Eyes: no blurry vision, no xerophthalmia ENT: no sore throat, no nodules palpated in neck, no  dysphagia, no odynophagia, no hoarseness Cardiovascular: no CP/no SOB/no palpitations/no leg swelling Respiratory: no cough/no SOB/no wheezing Gastrointestinal: no N/no V/no D/no C/no acid reflux Musculoskeletal: no muscle aches/no joint aches Skin: no rashes, no hair loss Neurological: no tremors/+ numbness/+ tingling/no dizziness  I reviewed pt's medications, allergies, PMH, social hx, family hx, and changes were documented in the history of present illness. Otherwise, unchanged from my initial visit note.  Past Medical History:  Diagnosis Date  . Diabetes mellitus    Type II  . Family history of breast cancer   . Family history of breast cancer    No past surgical history on file. History   Social History  . Marital Status:  Married    Spouse Name: N/A  . 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 Smoking status: Never Smoker   . Smokeless tobacco: Not on file  . Alcohol Use: Socially; liquor  . Drug Use: No   Social History Narrative   Regular exercise - 2-3x a week   Current Outpatient Medications on File Prior to Visit  Medication Sig Dispense Refill  . ACCU-CHEK FASTCLIX LANCETS MISC Use to check sugars daily 200 each 5  . ACCU-CHEK GUIDE test strip USE TO CHECK SUGAR DAILY AS DIRECTED 100 each 5  . acyclovir (ZOVIRAX) 400 MG tablet TAKE 2 TABLETS BY MOUTH 2 TIMES DAILY PRN. 50 tablet 3  . B Complex-C (SUPER B COMPLEX PO) Take 1 tablet by mouth daily.    . Blood Glucose Monitoring Suppl (ACCU-CHEK GUIDE) w/Device KIT 1 each by Does not apply route daily. 1 kit 0  . canagliflozin (INVOKANA) 100 MG TABS tablet Take 1 tablet (100 mg total) by mouth daily. 90 tablet 3  . cholecalciferol (VITAMIN D) 1000 UNITS tablet Take 1,000 Units by mouth daily.    Marland Kitchen DASETTA 1/35 tablet TAKE 1 TABLET BY MOUTH ONCE DAILY 28 tablet 11  . fluticasone (FLONASE) 50 MCG/ACT nasal spray PLACE 1 SPRAY INTO BOTH NOSTRILS AT BEDTIME 16 g 6  . glipiZIDE (GLUCOTROL) 5 MG tablet Take 1 tablet (5 mg total) by mouth daily. 90 tablet 3  . metFORMIN (GLUCOPHAGE) 1000 MG tablet TAKE 1 TABLET BY MOUTH 2 TIMES DAILY WITH A MEAL. 200 tablet 4  . Multiple Vitamins-Minerals (DIASENSE MULTIVITAMIN) TABS Take 1 tablet by mouth daily.      . Omeprazole (PRILOSEC PO) Take by mouth as needed.     Marland Kitchen oseltamivir (TAMIFLU) 75 MG capsule Take 1 capsule (75 mg total) by mouth every 12 (twelve) hours. 10 capsule 0  . simvastatin (ZOCOR) 20 MG tablet Take 1 tablet (20 mg total) by mouth at bedtime. 90 tablet 3  . sitaGLIPtin (JANUVIA) 100 MG tablet Take 1 tablet (100 mg total) by mouth daily. 90 tablet 3  . TRULICITY 8.93 YB/0.1BP SOPN INJECT 0.75 MG INTO THE SKIN ONCE A WEEK AS DIRECTED 2 mL 0  . vitamin  B-12 (CYANOCOBALAMIN) 1000 MCG tablet Take 1,000 mcg by mouth daily.     . vitamin C (ASCORBIC ACID) 500 MG tablet Take 500 mg by mouth daily.     No current facility-administered medications on file prior to visit.    Allergies  Allergen Reactions  . Penicillins     REACTION: RASH   Family History  Problem Relation Age of Onset  . Diabetes Other        Familly Hx First degree relative  . Breast cancer  Mother 31  . Breast cancer Maternal Aunt 64  . Brain cancer Maternal Uncle 58   PE: BP 120/80   Pulse 100   Temp 97.8 F (36.6 C)   Ht '5\' 6"'  (1.676 m)   Wt 155 lb (70.3 kg)   SpO2 99%   BMI 25.02 kg/m  Body mass index is 25.02 kg/m. Wt Readings from Last 3 Encounters:  10/22/18 155 lb (70.3 kg)  03/22/18 161 lb 12.8 oz (73.4 kg)  12/05/17 156 lb (70.8 kg)   Constitutional: slightly overweight, in NAD Eyes: PERRLA, EOMI, no exophthalmos ENT: moist mucous membranes, no thyromegaly, no cervical lymphadenopathy Cardiovascular: RRR, No MRG Respiratory: CTA B Gastrointestinal: abdomen soft, NT, ND, BS+ Musculoskeletal: no deformities, strength intact in all 4 Skin: moist, warm, no rashes Neurological: no tremor with outstretched hands, DTR normal in all 4  ASSESSMENT: 1. DM2, non-insulin-dependent, controlled, without lon term complications, but with hyperglycemia  2. B12 deficiency  3. HL  PLAN:  1. Patient with longstanding, fairly well-controlled diabetes, on oral medications with metformin and SGLT2 inhibitor, and also weekly GLP-1 receptor agonist added at last visit.  At that time, we stopped her sulfonylurea and also her DPP 4 inhibitor. -She is still struggling with her diet and we discussed at length in the past about ways to decrease her insulin resistance, especially by decreasing fatty foods.  At last visit, sugars were at or close to goal in the morning but she was seldom checking them later in the day.  She had occasional hyperglycemic spikes.  At that time,  we added Trulicity.  She tolerates this well and lost 6 lbs on it. -Upon questioning, at this visit, she tells me that she is still on the 4.09 mg Trulicity weekly, did not increase to 1.5 mg.  Therefore, sugars have not improved significantly since last visit.  We discussed about increasing the dose especially as she is tolerating it very well, without nausea/constipation. -She did not start glipizide at last visit and I advised her to continue the low dose before dinner now but we may need to stop it if sugars improve after increasing Trulicity dose. - I suggested to:  Patient Instructions  Please continue: - Metformin 2000 mg with dinner - Invokana 100 mg daily - Glipizide 5 mg before dinner  Please increase: - Trulicity 1.5 mg weekly  Please return in 3 to 4 months with your sugar log.  - today, HbA1c is 7.6% (increased) - continue checking sugars at different times of the day - check 1x a day, rotating checks - advised for yearly eye exams >> she is UTD - We will check annual labs today - Return to clinic in 3-4 mo with sugar log     2. B12 deficiency -Continues on 1000 mcg B12 supplement daily + B complex -Latest vitamin B12 level was normal: Lab Results  Component Value Date   VITAMINB12 909 12/05/2017  -We will recheck the level today  3.  HL - Reviewed latest lipid panel from 11/2017: LDL at goal, slightly higher than before, the rest of the fractions also at goal Lab Results  Component Value Date   CHOL 191 12/05/2017   HDL 78.30 12/05/2017   LDLCALC 89 12/05/2017   LDLDIRECT 62.0 03/22/2015   TRIG 123.0 12/05/2017   CHOLHDL 2 12/05/2017  - Continues Zocor without side effects. -Recheck level today  Office Visit on 10/22/2018  Component Date Value Ref Range Status  . Microalb, Ur 10/22/2018 <0.7  0.0 -  1.9 mg/dL Final  . Creatinine,U 10/22/2018 87.2  mg/dL Final  . Microalb Creat Ratio 10/22/2018 0.8  0.0 - 30.0 mg/g Final  . Glucose, Bld 10/22/2018 193* 65 -  99 mg/dL Final   Comment: For someone without known diabetes, a glucose value >125 mg/dL indicates that they may have diabetes and this should be confirmed with a follow-up test. . .            Fasting reference interval .   . BUN 10/22/2018 18  7 - 25 mg/dL Final  . Creat 10/22/2018 0.76  0.50 - 1.05 mg/dL Final   Comment: For patients >31 years of age, the reference limit for Creatinine is approximately 13% higher for people identified as African-American. .   . GFR, Est Non African American 10/22/2018 91  > OR = 60 mL/min/1.55m Final  . GFR, Est African American 10/22/2018 106  > OR = 60 mL/min/1.759mFinal  . BUN/Creatinine Ratio 0411/91/4782OT APPLICABLE  6 - 22 (calc) Final  . Sodium 10/22/2018 139  135 - 146 mmol/L Final  . Potassium 10/22/2018 4.2  3.5 - 5.3 mmol/L Final  . Chloride 10/22/2018 101  98 - 110 mmol/L Final  . CO2 10/22/2018 27  20 - 32 mmol/L Final  . Calcium 10/22/2018 9.8  8.6 - 10.4 mg/dL Final  . Total Protein 10/22/2018 6.9  6.1 - 8.1 g/dL Final  . Albumin 10/22/2018 4.4  3.6 - 5.1 g/dL Final  . Globulin 10/22/2018 2.5  1.9 - 3.7 g/dL (calc) Final  . AG Ratio 10/22/2018 1.8  1.0 - 2.5 (calc) Final  . Total Bilirubin 10/22/2018 0.6  0.2 - 1.2 mg/dL Final  . Alkaline phosphatase (APISO) 10/22/2018 55  37 - 153 U/L Final  . AST 10/22/2018 18  10 - 35 U/L Final  . ALT 10/22/2018 23  6 - 29 U/L Final  . Cholesterol 10/22/2018 158  0 - 200 mg/dL Final   ATP III Classification       Desirable:  < 200 mg/dL               Borderline High:  200 - 239 mg/dL          High:  > = 240 mg/dL  . Triglycerides 10/22/2018 172.0* 0.0 - 149.0 mg/dL Final   Normal:  <150 mg/dLBorderline High:  150 - 199 mg/dL  . HDL 10/22/2018 65.00  >39.00 mg/dL Final  . VLDL 10/22/2018 34.4  0.0 - 40.0 mg/dL Final  . LDL Cholesterol 10/22/2018 59  0 - 99 mg/dL Final  . Total CHOL/HDL Ratio 10/22/2018 2   Final                  Men          Women1/2 Average Risk     3.4           3.3Average Risk          5.0          4.42X Average Risk          9.6          7.13X Average Risk          15.0          11.0                      . NonHDL 10/22/2018 93.28   Final   NOTE:  Non-HDL goal should be 30 mg/dL higher than  patient's LDL goal (i.e. LDL goal of < 70 mg/dL, would have non-HDL goal of < 100 mg/dL)  . Vitamin B-12 10/22/2018 890  211 - 911 pg/mL Final  . Hemoglobin A1C 10/22/2018 7.6* 4.0 - 5.6 % Final   Labs OK except for high Glu.  Philemon Kingdom, MD PhD [facility] Endocrinology

## 2018-10-31 MED FILL — metFORMIN HCL 1000 MG TABS: 1000 | 90 days supply | Qty: 180 | Fill #0

## 2018-10-31 MED FILL — FLUTICASONE PROP 50 MCG SPR: 50 | 60 days supply | Qty: 16 | Fill #0

## 2018-10-31 MED FILL — ACYCLOVIR 400 MG TABLET: 400 | 13 days supply | Qty: 50 | Fill #0

## 2018-10-31 MED FILL — glipiZIDE 5 MG TABS: 5 | 90 days supply | Qty: 90 | Fill #0

## 2018-11-24 MED FILL — TRULICITY 1.5 MG/0.5 ML PEN: 1.5 | 28 days supply | Qty: 2 | Fill #1

## 2018-12-03 ENCOUNTER — Other Ambulatory Visit: Payer: Self-pay | Admitting: Internal Medicine

## 2018-12-03 MED FILL — ALYACEN 1-35-28 TABLET: 1-35 | 28 days supply | Qty: 28 | Fill #0

## 2018-12-04 MED FILL — ACCU-CHEK GUIDE TEST STRIP: 90 days supply | Qty: 100 | Fill #0 | Status: TO

## 2018-12-09 ENCOUNTER — Encounter: Payer: 59 | Admitting: Family Medicine

## 2018-12-11 ENCOUNTER — Encounter: Payer: 59 | Admitting: Family Medicine

## 2018-12-16 ENCOUNTER — Other Ambulatory Visit: Payer: Self-pay | Admitting: Internal Medicine

## 2018-12-16 ENCOUNTER — Encounter: Payer: 59 | Admitting: Family Medicine

## 2018-12-16 MED FILL — INVOKANA 100 MG TABLET: 100 | 90 days supply | Qty: 90 | Fill #0

## 2018-12-23 MED FILL — TRULICITY 1.5 MG/0.5 ML PEN: 1.5 | 28 days supply | Qty: 2 | Fill #0

## 2018-12-30 ENCOUNTER — Other Ambulatory Visit: Payer: Self-pay

## 2018-12-30 ENCOUNTER — Ambulatory Visit (INDEPENDENT_AMBULATORY_CARE_PROVIDER_SITE_OTHER): Payer: 59 | Admitting: Family Medicine

## 2018-12-30 ENCOUNTER — Encounter: Payer: Self-pay | Admitting: Family Medicine

## 2018-12-30 VITALS — BP 132/82 | HR 96 | Temp 98.2°F | Resp 16 | Ht 66.0 in | Wt 151.8 lb

## 2018-12-30 DIAGNOSIS — Z1211 Encounter for screening for malignant neoplasm of colon: Secondary | ICD-10-CM

## 2018-12-30 DIAGNOSIS — E782 Mixed hyperlipidemia: Secondary | ICD-10-CM | POA: Diagnosis not present

## 2018-12-30 DIAGNOSIS — Z0000 Encounter for general adult medical examination without abnormal findings: Secondary | ICD-10-CM

## 2018-12-30 DIAGNOSIS — B009 Herpesviral infection, unspecified: Secondary | ICD-10-CM | POA: Diagnosis not present

## 2018-12-30 DIAGNOSIS — E119 Type 2 diabetes mellitus without complications: Secondary | ICD-10-CM | POA: Diagnosis not present

## 2018-12-30 MED ORDER — DASETTA 1/35 (28) 1-35 MG-MCG PO TABS: 1.0000 | ORAL_TABLET | Freq: Every day | ORAL | 3 refills | Status: DC

## 2018-12-30 MED ORDER — ACYCLOVIR 400 MG PO TABS: ORAL_TABLET | ORAL | 3 refills | Status: DC

## 2018-12-30 MED FILL — ALYACEN 1-35-28 TABLET: 1-35 | 84 days supply | Qty: 84 | Fill #0

## 2018-12-30 MED FILL — ACYCLOVIR 400 MG TABLET: 400 | 13 days supply | Qty: 50 | Fill #0

## 2018-12-30 NOTE — Progress Notes (Signed)
[place] [facility] [address] [place], [place] [address] [phone]  Date:  12/30/2018   Name:  [name] Young   DOB:  Nov 16, 1970   MRN:  [id number]  PCP:  [name] Mclean, MD    Chief Complaint: Annual Exam   History of Present Illness:  [name] Young is a 48 y.o. very pleasant female patient who presents with the following:  Here today for a CPE. Last seen by myself about a year ago for a CPE  History of DM, hyperlipidemia, elevated BP  Her DM is managed by Dr. Cruzita Young- note from April-  - I suggested to:  Patient Instructions  Please continue: - Metformin 2000 mg with dinner - Invokana 100 mg daily - Glipizide 5 mg before dinner  Please increase: - Trulicity 1.5 mg weekly  Please return in 3 to 4 months with your sugar log.  - today, HbA1c is 7.6% (increased) - continue checking sugars at different times of the day - check 1x a day, rotating checks - advised for yearly eye exams >> she is UTD - We will check annual labs today - Return to clinic in 3-4 mo with sugar log                                 Pap: due this year- ? Do today.  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  No issues with chest pain or shortness of breath  Wt Readings from Last 3 Encounters:  12/30/18 151 lb  12.8 oz (68.9 kg)  10/22/18 155 lb (70.3 kg)  03/22/18 161 lb 12.8 oz (73.4 kg)     Patient Active Problem List   Diagnosis Date Noted  . Elevated blood pressure reading 03/20/2017  . Genetic testing 06/26/2016  . Family history of breast cancer   . Type 2 diabetes mellitus with hyperglycemia ([place]) 09/13/2015  . Nonrheumatic mitral valve prolapse 03/29/2015  . Vitamin B12 deficiency 03/12/2015  . Dysplastic nevus of trunk 03/05/2012  . HSV (herpes simplex virus) infection 02/06/2012  . DUB (dysfunctional uterine bleeding) 02/06/2012  . Dysplastic nevus of lower extremity, right 12/12/2011  . Hyperlipidemia 10/23/2008  . ALLERGIC REACTION 07/10/2007    Past Medical History:  Diagnosis Date  . Diabetes mellitus    Type II  . Family history of breast cancer   . Family history of breast cancer     History reviewed. No pertinent surgical history.  Social History   Tobacco Use  . Smoking status: Never Smoker  . Smokeless tobacco: Never Used  Substance Use Topics  . Alcohol use: No  . Drug use: No    Family History  Problem Relation Age of Onset  .  Diabetes Other        Familly Hx First degree relative  . Breast cancer Mother 53  . Breast cancer Maternal Aunt 64  . Brain cancer Maternal Uncle 58    Allergies  Allergen Reactions  . Penicillins     REACTION: RASH    Medication list has been reviewed and updated.  Current Outpatient Medications on File Prior to Visit  Medication Sig Dispense Refill  . ACCU-CHEK FASTCLIX LANCETS MISC Use to check sugars daily 200 each 5  . ACCU-CHEK GUIDE test strip USE TO CHECK SUGAR DAILY AS DIRECTED 100 each 5  . B Complex-C (SUPER B COMPLEX PO) Take 1 tablet by mouth daily.    . Blood Glucose Monitoring Suppl (ACCU-CHEK GUIDE) w/Device KIT 1 each by Does not apply route daily. 1 kit 0  . cholecalciferol (VITAMIN D) 1000 UNITS tablet Take 1,000 Units by mouth daily.    . Dulaglutide (TRULICITY) 1.5 CH/8.8FO SOPN Inject 1.5 mg  into the skin once a week. 4 pen 11  . fluticasone (FLONASE) 50 MCG/ACT nasal spray PLACE 1 SPRAY INTO BOTH NOSTRILS AT BEDTIME 16 g 6  . glipiZIDE (GLUCOTROL) 5 MG tablet Take 1 tablet (5 mg total) by mouth daily. 90 tablet 3  . INVOKANA 100 MG TABS tablet TAKE 1 TABLET BY MOUTH DAILY. 90 tablet 0  . metFORMIN (GLUCOPHAGE) 1000 MG tablet TAKE 1 TABLET BY MOUTH 2 TIMES DAILY WITH A MEAL. 200 tablet 4  . Multiple Vitamins-Minerals (DIASENSE MULTIVITAMIN) TABS Take 1 tablet by mouth daily.      . Omeprazole (PRILOSEC PO) Take by mouth as needed.     . simvastatin (ZOCOR) 20 MG tablet Take 1 tablet (20 mg total) by mouth at bedtime. 90 tablet 3  . vitamin B-12 (CYANOCOBALAMIN) 1000 MCG tablet Take 1,000 mcg by mouth daily.     . vitamin C (ASCORBIC ACID) 500 MG tablet Take 500 mg by mouth daily.     No current facility-administered medications on file prior to visit.     Review of Systems:  As per HPI- otherwise negative.   Physical Examination: Vitals:   12/30/18 1448  BP: 132/82  Pulse: (!) 103  Resp: 16  Temp: 98.2 F (36.8 C)  SpO2: 98%   Vitals:   12/30/18 1448  Weight: 151 lb 12.8 oz (68.9 kg)  Height: _0  (1.676 m)   Body mass index is 24.5 kg/m. Ideal Body Weight: Weight in (lb) to have BMI = 25: 154.6  GEN: WDWN, NAD, Non-toxic, A & O x 3, normal weight, looks well HEENT: Atraumatic, Normocephalic. Neck supple. No masses, No LAD. Ears and Nose: No external deformity. CV: RRR, No M/G/R. No JVD. No thrill. No extra heart sounds. PULM: CTA B, no wheezes, crackles, rhonchi. No retractions. No resp. distress. No accessory muscle use. ABD: S, NT, ND. No rebound. No HSM. EXTR: No c/c/e NEURO Normal gait.  PSYCH: Normally interactive. Conversant. Not depressed or anxious appearing.  Calm demeanor.  Foot exam done today, normal  Assessment and Plan:   ICD-10-CM   1. Physical exam  Z00.00 norethindrone-ethinyl estradiol 1/35 (DASETTA 1/35) tablet  2. Screening for  colon cancer  Z12.11   3. Type 2 diabetes mellitus without complication, without long-term current use of insulin (HCC)  E11.9   4. Mixed hyperlipidemia  E78.2   5. HSV (herpes simplex virus) infection  B00.9 acyclovir (ZOVIRAX) 400 MG tablet   Here today for complete physical.  She declines a Pap  today, had negative HPV 3 years ago so this is acceptable.  Ordered Cologuard today.  She continues to see endocrinology for her diabetes We decided not to draw blood today as she is only due for CBC.  She will asked Dr. Dominica Severin to do this at next labs Refilled her oral contraceptive pill and as needed Valtrex today  Follow-up: No follow-ups on file.  Meds ordered this encounter  Medications  . acyclovir (ZOVIRAX) 400 MG tablet    Sig: TAKE 2 TABLETS BY MOUTH 2 TIMES DAILY PRN.    Dispense:  50 tablet    Refill:  3  . norethindrone-ethinyl estradiol 1/35 (DASETTA 1/35) tablet    Sig: Take 1 tablet by mouth daily.    Dispense:  3 Package    Refill:  3   No orders of the defined types were placed in this encounter.     Signed Lamar Blinks, MD

## 2018-12-30 NOTE — Patient Instructions (Signed)
Great to see you again today- keep taking good care of yourself We will order Cologuard to screen for colon cancer Let's be sure to do a pap next year At your next labs with Dr. Cruzita Lederer, please ask if she can do a CBC for [place].  You might also consider HIV screening (we are encouraged to test all adult patients at least once) Also think about getting the shingles vaccine at your convenience this is a 2 shot series  Health Maintenance, Female Adopting a healthy lifestyle and getting preventive care can go a long way to promote health and wellness. Talk with your health care provider about what schedule of regular examinations is right for you. This is a good chance for you to check in with your provider about disease prevention and staying healthy. In between checkups, there are plenty of things you can do on your own. Experts have done a lot of research about which lifestyle changes and preventive measures are most likely to keep you healthy. Ask your health care provider for more information. Weight and diet Eat a healthy diet  Be sure to include plenty of vegetables, fruits, low-fat dairy products, and lean protein.  Do not eat a lot of foods high in solid fats, added sugars, or salt.  Get regular exercise. This is one of the most important things you can do for your health. ? Most adults should exercise for at least 150 minutes each week. The exercise should increase your heart rate and make you sweat (moderate-intensity exercise). ? Most adults should also do strengthening exercises at least twice a week. This is in addition to the moderate-intensity exercise. Maintain a healthy weight  Body mass index (BMI) is a measurement that can be used to identify possible weight problems. It estimates body fat based on height and weight. Your health care provider can help determine your BMI and help you achieve or maintain a healthy weight.  For females 60 years of age and older: ? A BMI below 18.5  is considered underweight. ? A BMI of 18.5 to 24.9 is normal. ? A BMI of 25 to 29.9 is considered overweight. ? A BMI of 30 and above is considered obese. Watch levels of cholesterol and blood lipids  You should start having your blood tested for lipids and cholesterol at 50 years of age, then have this test every 5 years.  You may need to have your cholesterol levels checked more often if: ? Your lipid or cholesterol levels are high. ? You are older than 50 years of age. ? You are at high risk for heart disease. Cancer screening Lung Cancer  Lung cancer screening is recommended for adults 73-49 years old who are at high risk for lung cancer because of a history of smoking.  A yearly low-dose CT scan of the lungs is recommended for people who: ? Currently smoke. ? Have quit within the past 15 years. ? Have at least a 30-pack-year history of smoking. A pack year is smoking an average of one pack of cigarettes a day for 1 year.  Yearly screening should continue until it has been 15 years since you quit.  Yearly screening should stop if you develop a health problem that would prevent you from having lung cancer treatment. Breast Cancer  Practice breast self-awareness. This means understanding how your breasts normally appear and feel.  It also means doing regular breast self-exams. Let your health care provider know about any changes, no matter how small.  If you are in your 20s or 30s, you should have a clinical breast exam (CBE) by a health care provider every 1-3 years as part of a regular health exam.  If you are 47 or older, have a CBE every year. Also consider having a breast X-ray (mammogram) every year.  If you have a family history of breast cancer, talk to your health care provider about genetic screening.  If you are at high risk for breast cancer, talk to your health care provider about having an MRI and a mammogram every year.  Breast cancer gene (BRCA) assessment is  recommended for women who have family members with BRCA-related cancers. BRCA-related cancers include: ? Breast. ? Ovarian. ? Tubal. ? Peritoneal cancers.  Results of the assessment will determine the need for genetic counseling and BRCA1 and BRCA2 testing. Cervical Cancer Your health care provider may recommend that you be screened regularly for cancer of the pelvic organs (ovaries, uterus, and vagina). This screening involves a pelvic examination, including checking for microscopic changes to the surface of your cervix (Pap test). You may be encouraged to have this screening done every 3 years, beginning at age 2.  For women ages 75-65, health care providers may recommend pelvic exams and Pap testing every 3 years, or they may recommend the Pap and pelvic exam, combined with testing for human papilloma virus (HPV), every 5 years. Some types of HPV increase your risk of cervical cancer. Testing for HPV may also be done on women of any age with unclear Pap test results.  Other health care providers may not recommend any screening for nonpregnant women who are considered low risk for pelvic cancer and who do not have symptoms. Ask your health care provider if a screening pelvic exam is right for you.  If you have had past treatment for cervical cancer or a condition that could lead to cancer, you need Pap tests and screening for cancer for at least 20 years after your treatment. If Pap tests have been discontinued, your risk factors (such as having a new sexual partner) need to be reassessed to determine if screening should resume. Some women have medical problems that increase the chance of getting cervical cancer. In these cases, your health care provider may recommend more frequent screening and Pap tests. Colorectal Cancer  This type of cancer can be detected and often prevented.  Routine colorectal cancer screening usually begins at 50 years of age and continues through 50 years of  age.  Your health care provider may recommend screening at an earlier age if you have risk factors for colon cancer.  Your health care provider may also recommend using home test kits to check for hidden blood in the stool.  A small camera at the end of a tube can be used to examine your colon directly (sigmoidoscopy or colonoscopy). This is done to check for the earliest forms of colorectal cancer.  Routine screening usually begins at age 23.  Direct examination of the colon should be repeated every 5-10 years through 50 years of age. However, you may need to be screened more often if early forms of precancerous polyps or small growths are found. Skin Cancer  Check your skin from head to toe regularly.  Tell your health care provider about any new moles or changes in moles, especially if there is a change in a mole's shape or color.  Also tell your health care provider if you have a mole that is larger than the  size of a pencil eraser.  Always use sunscreen. Apply sunscreen liberally and repeatedly throughout the day.  Protect yourself by wearing long sleeves, pants, a wide-brimmed hat, and sunglasses whenever you are outside. Heart disease, diabetes, and high blood pressure  High blood pressure causes heart disease and increases the risk of stroke. High blood pressure is more likely to develop in: ? People who have blood pressure in the high end of the normal range (130-139/85-89 mm Hg). ? People who are overweight or obese. ? People who are African American.  If you are 47-69 years of age, have your blood pressure checked every 3-5 years. If you are 20 years of age or older, have your blood pressure checked every year. You should have your blood pressure measured twice-once when you are at a hospital or clinic, and once when you are not at a hospital or clinic. Record the average of the two measurements. To check your blood pressure when you are not at a hospital or clinic, you can  use: ? An automated blood pressure machine at a pharmacy. ? A home blood pressure monitor.  If you are between 79 years and 30 years old, ask your health care provider if you should take aspirin to prevent strokes.  Have regular diabetes screenings. This involves taking a blood sample to check your fasting blood sugar level. ? If you are at a normal weight and have a low risk for diabetes, have this test once every three years after 50 years of age. ? If you are overweight and have a high risk for diabetes, consider being tested at a younger age or more often. Preventing infection Hepatitis B  If you have a higher risk for hepatitis B, you should be screened for this virus. You are considered at high risk for hepatitis B if: ? You were born in a country where hepatitis B is common. Ask your health care provider which countries are considered high risk. ? Your parents were born in a high-risk country, and you have not been immunized against hepatitis B (hepatitis B vaccine). ? You have HIV or AIDS. ? You use needles to inject street drugs. ? You live with someone who has hepatitis B. ? You have had sex with someone who has hepatitis B. ? You get hemodialysis treatment. ? You take certain medicines for conditions, including cancer, organ transplantation, and autoimmune conditions. Hepatitis C  Blood testing is recommended for: ? Everyone born from [birth year] through 1967. ? Anyone with known risk factors for hepatitis C. Sexually transmitted infections (STIs)  You should be screened for sexually transmitted infections (STIs) including gonorrhea and chlamydia if: ? You are sexually active and are younger than 50 years of age. ? You are older than 50 years of age and your health care provider tells you that you are at risk for this type of infection. ? Your sexual activity has changed since you were last screened and you are at an increased risk for chlamydia or gonorrhea. Ask your health care  provider if you are at risk.  If you do not have HIV, but are at risk, it may be recommended that you take a prescription medicine daily to prevent HIV infection. This is called pre-exposure prophylaxis (PrEP). You are considered at risk if: ? You are sexually active and do not regularly use condoms or know the HIV status of your partner(s). ? You take drugs by injection. ? You are sexually active with a partner who has HIV.  Talk with your health care provider about whether you are at high risk of being infected with HIV. If you choose to begin PrEP, you should first be tested for HIV. You should then be tested every 3 months for as long as you are taking PrEP. Pregnancy  If you are premenopausal and you may become pregnant, ask your health care provider about preconception counseling.  If you may become pregnant, take 400 to 800 micrograms (mcg) of folic acid every day.  If you want to prevent pregnancy, talk to your health care provider about birth control (contraception). Osteoporosis and menopause  Osteoporosis is a disease in which the bones lose minerals and strength with aging. This can result in serious bone fractures. Your risk for osteoporosis can be identified using a bone density scan.  If you are 68 years of age or older, or if you are at risk for osteoporosis and fractures, ask your health care provider if you should be screened.  Ask your health care provider whether you should take a calcium or vitamin D supplement to lower your risk for osteoporosis.  Menopause may have certain physical symptoms and risks.  Hormone replacement therapy may reduce some of these symptoms and risks. Talk to your health care provider about whether hormone replacement therapy is right for you. Follow these instructions at home:  Schedule regular health, dental, and eye exams.  Stay current with your immunizations.  Do not use any tobacco products including cigarettes, chewing tobacco, or  electronic cigarettes.  If you are pregnant, do not drink alcohol.  If you are breastfeeding, limit how much and how often you drink alcohol.  Limit alcohol intake to no more than 1 drink per day for nonpregnant women. One drink equals 12 ounces of beer, 5 ounces of wine, or 1 ounces of hard liquor.  Do not use street drugs.  Do not share needles.  Ask your health care provider for help if you need support or information about quitting drugs.  Tell your health care provider if you often feel depressed.  Tell your health care provider if you have ever been abused or do not feel safe at home. This information is not intended to replace advice given to you by your health care provider. Make sure you discuss any questions you have with your health care provider. Document Released: 01/09/2011 Document Revised: 12/02/2015 Document Reviewed: 03/30/2015 Elsevier Interactive Patient Education  2019 [employer].

## 2019-01-07 DIAGNOSIS — Z1212 Encounter for screening for malignant neoplasm of rectum: Secondary | ICD-10-CM | POA: Diagnosis not present

## 2019-01-07 DIAGNOSIS — Z1211 Encounter for screening for malignant neoplasm of colon: Secondary | ICD-10-CM | POA: Diagnosis not present

## 2019-01-11 LAB — COLOGUARD: Cologuard: NEGATIVE

## 2019-01-15 ENCOUNTER — Encounter: Payer: Self-pay | Admitting: Family Medicine

## 2019-01-16 ENCOUNTER — Encounter: Payer: Self-pay | Admitting: Family Medicine

## 2019-01-17 ENCOUNTER — Other Ambulatory Visit: Payer: Self-pay

## 2019-01-17 ENCOUNTER — Telehealth: Payer: Self-pay | Admitting: Internal Medicine

## 2019-01-17 DIAGNOSIS — E782 Mixed hyperlipidemia: Secondary | ICD-10-CM

## 2019-01-17 MED ORDER — SIMVASTATIN 20 MG PO TABS: 20.0000 mg | ORAL_TABLET | Freq: Every day | ORAL | 3 refills | Status: DC

## 2019-01-17 MED FILL — SIMVASTATIN 20 MG TABLET: 20 | 90 days supply | Qty: 90 | Fill #0

## 2019-01-17 NOTE — Telephone Encounter (Signed)
OK, she would only need an HbA1c done.  Can you please order?

## 2019-01-17 NOTE — Telephone Encounter (Signed)
Patient called to inform she will be unable to make it to her appointment due to being scheduled to work. There was not any availability for the dates she requested. We have changed her visit to a virtual visit and she also requested to come in a do her labs on the Friday before the appointment. Is that ok?  Please Advise, Thanks

## 2019-01-21 MED FILL — TRULICITY 1.5 MG/0.5 ML PEN: 1.5 | 28 days supply | Qty: 2 | Fill #1

## 2019-01-27 DIAGNOSIS — H524 Presbyopia: Secondary | ICD-10-CM | POA: Diagnosis not present

## 2019-01-27 LAB — HM DIABETES EYE EXAM

## 2019-01-31 MED FILL — metFORMIN HCL 1000 MG TABS: 1000 | 90 days supply | Qty: 180 | Fill #0

## 2019-02-03 ENCOUNTER — Encounter: Payer: Self-pay | Admitting: Family Medicine

## 2019-02-19 ENCOUNTER — Other Ambulatory Visit: Payer: Self-pay | Admitting: Internal Medicine

## 2019-02-19 DIAGNOSIS — IMO0001 Reserved for inherently not codable concepts without codable children: Secondary | ICD-10-CM

## 2019-02-19 MED FILL — TRULICITY 1.5 MG/0.5 ML PEN: 1.5 | 28 days supply | Qty: 2 | Fill #2

## 2019-02-19 MED FILL — glipiZIDE 5 MG TABS: 5 | 90 days supply | Qty: 90 | Fill #0

## 2019-02-25 ENCOUNTER — Other Ambulatory Visit: Payer: Self-pay | Admitting: Internal Medicine

## 2019-02-25 DIAGNOSIS — E1165 Type 2 diabetes mellitus with hyperglycemia: Secondary | ICD-10-CM

## 2019-02-25 DIAGNOSIS — E538 Deficiency of other specified B group vitamins: Secondary | ICD-10-CM

## 2019-02-28 ENCOUNTER — Other Ambulatory Visit: Payer: Self-pay

## 2019-02-28 ENCOUNTER — Other Ambulatory Visit (INDEPENDENT_AMBULATORY_CARE_PROVIDER_SITE_OTHER): Payer: 59

## 2019-02-28 DIAGNOSIS — E1165 Type 2 diabetes mellitus with hyperglycemia: Secondary | ICD-10-CM

## 2019-02-28 DIAGNOSIS — E538 Deficiency of other specified B group vitamins: Secondary | ICD-10-CM | POA: Diagnosis not present

## 2019-02-28 LAB — CBC
HCT: 41 % (ref 36.0–46.0)
Hemoglobin: 13.8 g/dL (ref 12.0–15.0)
MCHC: 33.5 g/dL (ref 30.0–36.0)
MCV: 92.1 fl (ref 78.0–100.0)
Platelets: 257 10*3/uL (ref 150.0–400.0)
RBC: 4.46 Mil/uL (ref 3.87–5.11)
RDW: 13.3 % (ref 11.5–15.5)
WBC: 5.5 10*3/uL (ref 4.0–10.5)

## 2019-02-28 LAB — HEMOGLOBIN A1C: Hgb A1c MFr Bld: 7.8 % — ABNORMAL HIGH (ref 4.6–6.5)

## 2019-02-28 LAB — VITAMIN B12: Vitamin B-12: 1216 pg/mL — ABNORMAL HIGH (ref 211–911)

## 2019-03-01 LAB — BASIC METABOLIC PANEL WITH GFR
BUN: 15 mg/dL (ref 7–25)
CO2: 27 mmol/L (ref 20–32)
Calcium: 9.4 mg/dL (ref 8.6–10.4)
Chloride: 102 mmol/L (ref 98–110)
Creat: 0.8 mg/dL (ref 0.50–1.05)
GFR, Est African American: 100 mL/min/{1.73_m2} (ref >=60)
GFR, Est Non African American: 86 mL/min/{1.73_m2} (ref >=60)
Glucose, Bld: 163 mg/dL — ABNORMAL HIGH (ref 65–99)
Potassium: 4.3 mmol/L (ref 3.5–5.3)
Sodium: 140 mmol/L (ref 135–146)

## 2019-03-04 ENCOUNTER — Other Ambulatory Visit: Payer: Self-pay

## 2019-03-04 ENCOUNTER — Encounter: Payer: Self-pay | Admitting: Internal Medicine

## 2019-03-04 ENCOUNTER — Other Ambulatory Visit: Payer: Self-pay | Admitting: Family Medicine

## 2019-03-04 ENCOUNTER — Ambulatory Visit (INDEPENDENT_AMBULATORY_CARE_PROVIDER_SITE_OTHER): Payer: 59 | Admitting: Internal Medicine

## 2019-03-04 DIAGNOSIS — E538 Deficiency of other specified B group vitamins: Secondary | ICD-10-CM

## 2019-03-04 DIAGNOSIS — E1165 Type 2 diabetes mellitus with hyperglycemia: Secondary | ICD-10-CM

## 2019-03-04 DIAGNOSIS — E782 Mixed hyperlipidemia: Secondary | ICD-10-CM | POA: Diagnosis not present

## 2019-03-04 DIAGNOSIS — Z1231 Encounter for screening mammogram for malignant neoplasm of breast: Secondary | ICD-10-CM

## 2019-03-04 MED ORDER — OZEMPIC (1 MG/DOSE) 2 MG/1.5ML ~~LOC~~ SOPN: 1.0000 mg | PEN_INJECTOR | SUBCUTANEOUS | 5 refills | Status: DC

## 2019-03-04 MED FILL — OZEMPIC 1 MG/DOSE SOPN: 2 | 28 days supply | Qty: 3 | Fill #0

## 2019-03-04 NOTE — Patient Instructions (Addendum)
Please continue: - Metformin 2000 mg with dinner - Invokana 100 mg daily - Glipizide 5 mg before dinner  Please try to change from Trulicity to: - Ozempic 1 mg weekly  Check some sugars after dinner.  Please return in 3 months with your sugar log.

## 2019-03-04 NOTE — Progress Notes (Signed)
Patient ID: [name] Young, female   DOB: 28-Jun-1971, 48 y.o.   MRN: [id number]  Patient location: Home My location: Office  Referring Provider: Darreld Mclean, MD  I connected with the patient on 03/04/19 at  9:05 AM EDT by a video enabled telemedicine application and verified that I am speaking with the correct person.   I discussed the limitations of evaluation and management by telemedicine and the availability of in person appointments. The patient expressed understanding and agreed to proceed.   Details of the encounter are shown below.  HPI: [name] Young is a 48 y.o.-year-old female, presenting for f/u for DM2, dx in 2008 (GDM 1998), non-insulin-dependent, controlled, without long-term complications. Last visit 4 months ago.  She had labs checked before visit, per her preference, since she could not come today due to work duties.   DM2: Last hemoglobin A1c was: Lab Results  Component Value Date   HGBA1C 7.8 (H) 02/28/2019   HGBA1C 7.6 (A) 10/22/2018   HGBA1C 6.9 (A) 03/22/2018  Since she was dx with DM >> she lost 50 lbs and was able to come off Lantus.  She is on: - Metformin 2000 mg with dinner - Invokana 100 mg daily - Glipizide 5 mg before dinner - Trulicity 1.5 mg weekly -increased 10/2018  Pt checks her sugars 1-2 times a day: - am:   109-175, 191 >> 98, 116, 130-170, 204  - 2h after b'fast: 163  >> n/c >> 200 >> 205 >> 182 - before lunch:   100 >> n/c >> 186 >> 134 - >2h after lunch: n/c >> 158 >> n/c >> 223 >> n/c - before dinner: 77, 160 >> 117-134 >> 136 >> 167 - 2h after dinner: n/c >> 124 >> n/c >> 193 >> n/c - bedtime: 144, 157 >> n/c >> 69, 77 >> n/c - nighttime: 58 >> n/c >> 118 >> n/c Lowest sugar was 49 (alcohol) >> ... 66 (am) >> 77 >> 109 >> 98; she has hypoglycemia awareness in the 60s. Highest sugar was 223 >> 205 >> 204.  Glucometer: Bayer  Pt's meals are: - Breakfast: Eggs or oatmeal - Lunch: Salad or veggies or tuna salad with  saltines or chicken tenders - Dinner: Fast food - Snacks: Graham crackers  -No CKD, last BUN/creatinine:  Lab Results  Component Value Date   BUN 15 02/28/2019   CREATININE 0.80 02/28/2019   She was previously on lisinopri, then stopped.  ACR remains normal: Lab Results  Component Value Date   MICRALBCREAT 0.8 10/22/2018   MICRALBCREAT 0.6 04/20/2017   MICRALBCREAT 0.7 03/16/2016   MICRALBCREAT 0.7 03/22/2015   MICRALBCREAT 0.7 10/27/2014   MICRALBCREAT 0.1 02/09/2014   MICRALBCREAT 0.1 01/28/2013   MICRALBCREAT 0.3 06/07/2011   MICRALBCREAT 0.2 11/29/2010   MICRALBCREAT 4.6 10/21/2009   -+ HL; last set of lipids: Lab Results  Component Value Date   CHOL 158 10/22/2018   HDL 65.00 10/22/2018   LDLCALC 59 10/22/2018   LDLDIRECT 62.0 03/22/2015   TRIG 172.0 (H) 10/22/2018   CHOLHDL 2 10/22/2018  On Zocor. - last eye exam was in 01/2019: No [facility]. -She does have numbness and tingling in her feet  Vit B12 def. -On 1000 mcg B12 + B complex daily  Reviewed her B12 levels: Lab Results  Component Value Date   VITAMINB12 1,216 (H) 02/28/2019   VITAMINB12 890 10/22/2018   VITAMINB12 909 12/05/2017   VITAMINB12 782 01/24/2017   VITAMINB12 969 (H)  03/16/2016   VITAMINB12 503 09/13/2015   VITAMINB12 923 (H) 03/12/2015   VITAMINB12 220 12/10/2014   No results found for: VD25OH  She is on 1000 units vit D daily.  ROS: Constitutional: no weight gain/+ weight loss, no fatigue, no subjective hyperthermia, no subjective hypothermia Eyes: no blurry vision, no xerophthalmia ENT: no sore throat, no nodules palpated in neck, no dysphagia, no odynophagia, no hoarseness Cardiovascular: no CP/no SOB/no palpitations/no leg swelling Respiratory: no cough/no SOB/no wheezing Gastrointestinal: no N/no V/no D/no C/no acid reflux Musculoskeletal: no muscle aches/no joint aches Skin: no rashes, no hair loss Neurological: no tremors/+ numbness/+ tingling/no dizziness  I  reviewed pt's medications, allergies, PMH, social hx, family hx, and changes were documented in the history of present illness. Otherwise, unchanged from my initial visit note.  Past Medical History:  Diagnosis Date  . Diabetes mellitus    Type II  . Family history of breast cancer   . Family history of breast cancer    No past surgical history on file. History   Social History  . Marital Status: Married    Spouse Name: N/A  . 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 Smoking status: Never Smoker   . Smokeless tobacco: Not on file  . Alcohol Use: Socially; liquor  . Drug Use: No   Social History Narrative   Regular exercise - 2-3x a week   Current Outpatient Medications on File Prior to Visit  Medication Sig Dispense Refill  . ACCU-CHEK FASTCLIX LANCETS MISC Use to check sugars daily 200 each 5  . ACCU-CHEK GUIDE test strip USE TO CHECK SUGAR DAILY AS DIRECTED 100 each 5  . acyclovir (ZOVIRAX) 400 MG tablet TAKE 2 TABLETS BY MOUTH 2 TIMES DAILY PRN. 50 tablet 3  . B Complex-C (SUPER B COMPLEX PO) Take 1 tablet by mouth daily.    . Blood Glucose Monitoring Suppl (ACCU-CHEK GUIDE) w/Device KIT 1 each by Does not apply route daily. 1 kit 0  . cholecalciferol (VITAMIN D) 1000 UNITS tablet Take 1,000 Units by mouth daily.    . Dulaglutide (TRULICITY) 1.5 WE/3.1VQ SOPN Inject 1.5 mg into the skin once a week. 4 pen 11  . fluticasone (FLONASE) 50 MCG/ACT nasal spray PLACE 1 SPRAY INTO BOTH NOSTRILS AT BEDTIME 16 g 6  . glipiZIDE (GLUCOTROL) 5 MG tablet TAKE 1 TABLET BY MOUTH DAILY. 90 tablet 0  . INVOKANA 100 MG TABS tablet TAKE 1 TABLET BY MOUTH DAILY. 90 tablet 0  . metFORMIN (GLUCOPHAGE) 1000 MG tablet TAKE 1 TABLET BY MOUTH 2 TIMES DAILY WITH A MEAL. 200 tablet 4  . Multiple Vitamins-Minerals (DIASENSE MULTIVITAMIN) TABS Take 1 tablet by mouth daily.      . norethindrone-ethinyl estradiol 1/35 (DASETTA 1/35) tablet Take 1 tablet by  mouth daily. 3 Package 3  . Omeprazole (PRILOSEC PO) Take by mouth as needed.     . simvastatin (ZOCOR) 20 MG tablet Take 1 tablet (20 mg total) by mouth at bedtime. 90 tablet 3  . vitamin B-12 (CYANOCOBALAMIN) 1000 MCG tablet Take 1,000 mcg by mouth daily.     . vitamin C (ASCORBIC ACID) 500 MG tablet Take 500 mg by mouth daily.     No current facility-administered medications on file prior to visit.    Allergies  Allergen Reactions  . Penicillins     REACTION: RASH   Family History  Problem Relation Age of Onset  .  Diabetes Other        Familly Hx First degree relative  . Breast cancer Mother 2  . Breast cancer Maternal Aunt 64  . Brain cancer Maternal Uncle 58   PE: There were no vitals taken for this visit. There is no height or weight on file to calculate BMI.  03/04/2019: wt 151 lbs Wt Readings from Last 3 Encounters:  12/30/18 151 lb 12.8 oz (68.9 kg)  10/22/18 155 lb (70.3 kg)  03/22/18 161 lb 12.8 oz (73.4 kg)   Constitutional:  in NAD  The physical exam was not performed (virtual visit).  ASSESSMENT: 1. DM2, non-insulin-dependent, controlled, without lon term complications, but with hyperglycemia  2. B12 deficiency  3. HL  PLAN:  1. Patient with longstanding, fairly well-controlled diabetes in the past, on oral medications with metformin and SGLT2 inhibitor along with low-dose sulfonylurea and also weekly GLP-1 receptor agonist, which were increased at last visit.  She continues to struggle with her diet and we discussed at length in the past about ways to decrease her insulin resistance, especially by decreasing fatty foods.  At last visit, her HbA1c was slightly higher and we increased her Trulicity dose.  However, latest HbA1c obtained 3 days ago was even higher, at 7.8%. -At this visit, sugars are higher especially in the morning as she usually gets out of work late and she has late dinners (around 9 PM).  She sometimes also snacks after dinner.  Sugars in the  a.m. have reached 200s but usually are in the 140-170 range.  We discussed about options to improve these.  She cannot move her dinner earlier when she works.  We can try to cover these late dinners with insulin but at this point I am not sure if her sugars are high after dinner or they are at target.  I advised her to check some sugars after dinner, also.  In the meantime, we will try to switch to a stronger GLP-1 receptor agonist, Ozempic, 1 mg weekly.  I sent this prescription to her pharmacy.  If this is not covered, we may need to try a preauthorization, but if still not covered, we can try to increase Invokana to 300 mg daily.  If this is not enough, she may need insulin with dinner or even long-acting insulin.  She agrees with this plan. - I suggested to:  Patient Instructions  Please continue: - Metformin 2000 mg with dinner - Invokana 100 mg daily - Glipizide 5 mg before dinner  Please try to change from Trulicity to: - Ozempic 1 mg weekly  Check some sugars after dinner.  Please return in 3 months with your sugar log.  - advised to check sugars at different times of the day - 1-2x a day, rotating check times - advised for yearly eye exams >> she is UTD - return to clinic in 3 months    2. B12 deficiency -Continues on 1000 mcg B12 supplement daily + B complex -Latest B12 level was slightly high: Lab Results  Component Value Date   VITAMINB12 1,216 (H) 02/28/2019  -We will continue the current supplement dose  3.  HL - Reviewed latest lipid panel  Lab Results  Component Value Date   CHOL 158 10/22/2018   HDL 65.00 10/22/2018   LDLCALC 59 10/22/2018   LDLDIRECT 62.0 03/22/2015   TRIG 172.0 (H) 10/22/2018   CHOLHDL 2 10/22/2018  - Continues Zocor without side effects  Philemon Kingdom, MD PhD [facility]

## 2019-03-18 ENCOUNTER — Other Ambulatory Visit: Payer: Self-pay | Admitting: Internal Medicine

## 2019-03-18 MED FILL — INVOKANA 100 MG TABLET: 100 | 90 days supply | Qty: 90 | Fill #0

## 2019-03-18 MED FILL — ACYCLOVIR 400 MG TABLET: 400 | 13 days supply | Qty: 50 | Fill #0

## 2019-03-21 MED FILL — ACCU-CHEK GUIDE TEST STRIP: 90 days supply | Qty: 100 | Fill #0

## 2019-03-24 MED FILL — ALYACEN 1-35-28 TABLET: 1-35 | 84 days supply | Qty: 84 | Fill #0

## 2019-03-26 MED FILL — OZEMPIC 1 MG/DOSE SOPN: 2 | 28 days supply | Qty: 3 | Fill #1

## 2019-04-17 ENCOUNTER — Ambulatory Visit: Payer: 59

## 2019-04-17 MED FILL — OZEMPIC 1 MG/DOSE SOPN: 2 | 28 days supply | Qty: 3 | Fill #2

## 2019-04-21 MED FILL — ACYCLOVIR 400 MG TABLET: 400 | 13 days supply | Qty: 50 | Fill #1

## 2019-04-21 MED FILL — SIMVASTATIN 20 MG TABLET: 20 | 90 days supply | Qty: 90 | Fill #1

## 2019-05-05 MED FILL — metFORMIN HCL 1000 MG TABS: 1000 | 90 days supply | Qty: 180 | Fill #1

## 2019-05-14 MED FILL — OZEMPIC 1 MG/DOSE SOPN: 2 | 28 days supply | Qty: 3 | Fill #3

## 2019-05-26 ENCOUNTER — Other Ambulatory Visit: Payer: Self-pay | Admitting: Internal Medicine

## 2019-05-28 ENCOUNTER — Other Ambulatory Visit: Payer: Self-pay | Admitting: Internal Medicine

## 2019-05-29 ENCOUNTER — Other Ambulatory Visit: Payer: Self-pay | Admitting: Internal Medicine

## 2019-05-29 ENCOUNTER — Other Ambulatory Visit: Payer: Self-pay

## 2019-05-29 MED ORDER — GLIPIZIDE 5 MG PO TABS: 5.0000 mg | ORAL_TABLET | Freq: Every day | ORAL | 2 refills | Status: DC

## 2019-05-29 MED FILL — glipiZIDE 5 MG TABS: 5 | 90 days supply | Qty: 90 | Fill #0

## 2019-06-04 ENCOUNTER — Other Ambulatory Visit: Payer: Self-pay

## 2019-06-04 ENCOUNTER — Ambulatory Visit (INDEPENDENT_AMBULATORY_CARE_PROVIDER_SITE_OTHER): Payer: 59 | Admitting: Internal Medicine

## 2019-06-04 ENCOUNTER — Encounter: Payer: Self-pay | Admitting: Internal Medicine

## 2019-06-04 VITALS — BP 148/90 | HR 107 | Ht 66.0 in | Wt 150.0 lb

## 2019-06-04 DIAGNOSIS — E1165 Type 2 diabetes mellitus with hyperglycemia: Secondary | ICD-10-CM

## 2019-06-04 DIAGNOSIS — E782 Mixed hyperlipidemia: Secondary | ICD-10-CM

## 2019-06-04 DIAGNOSIS — E538 Deficiency of other specified B group vitamins: Secondary | ICD-10-CM | POA: Diagnosis not present

## 2019-06-04 LAB — POCT GLYCOSYLATED HEMOGLOBIN (HGB A1C): Hemoglobin A1C: 6.8 % — AB (ref 4.0–5.6)

## 2019-06-04 NOTE — Progress Notes (Addendum)
Patient ID: [name] Young, female   DOB: 1970/09/01, 48 y.o.   MRN: [id number]  This visit occurred during the SARS-CoV-2 public health emergency.  Safety protocols were in place, including screening questions prior to the visit, additional usage of staff PPE, and extensive cleaning of exam room while observing appropriate contact time as indicated for disinfecting solutions.   HPI: [name] Young is a 48 y.o.-year-old female, presenting for f/u for DM2, dx in 2008 (GDM 1998), non-insulin-dependent, controlled, without long-term complications. Last visit 3 months ago.  Sugars improved after last visit, but in the last 1 to 2 weeks, she has been more stressed at work, compared to before and sugars are slightly higher.   DM2: Last hemoglobin A1c was: Lab Results  Component Value Date   HGBA1C 7.8 (H) 02/28/2019   HGBA1C 7.6 (A) 10/22/2018   HGBA1C 6.9 (A) 03/22/2018  Since she was dx with DM >> she lost 50 lbs and was able to come off Lantus.  She is on: - Metformin 2000 mg with dinner - Invokana 1000 mg daily - Glipizide 5 mg before dinner - Trulicity 1.5 mg weekly -increased 10/2018 >> changed to Ozempic 1 mg weekly 02/2019-tolerated well  Pt checks her sugars 1-2 times a day: - am:  98, 116, 130-170, 204 >> 70, 103-146, 176, 181, 193 (doughnuts) - 2h after b'fast: 163  >> n/c >> 200 >> 205 >> 182 >> n/c - before lunch:   100 >> n/c >> 186 >> 134 >> >> 124-154, 160 - >2h after lunch: 158 >> n/c >> 223 >> n/c >> 127, 149 - before dinner: 77, 160 >> 117-134 >> 136 >> 167 >> 64 - 2h after dinner: n/c >> 124 >> n/c >> 193 >> n/c - bedtime: 144, 157 >> n/c >> 69, 77 >> n/c >> 92 - nighttime: 58 >> n/c >> 118 >> n/c Lowest sugar was 49 (alcohol) >> ... 98 >> 64 x1 (before dinner); she has hypoglycemia awareness in the 60s. Highest sugar was  204 >> 200 (out of Glipizide).  Glucometer: Bayer  Pt's meals are: - Breakfast: Eggs or oatmeal - Lunch: Salad or veggies or tuna salad with  saltines or chicken tenders - Dinner: Fast food - Snacks: Graham crackers  -No CKD, last BUN/creatinine:  Lab Results  Component Value Date   BUN 15 02/28/2019   CREATININE 0.80 02/28/2019   Previously on lisinopril, then stopped.  ACR remains normal: Lab Results  Component Value Date   MICRALBCREAT 0.8 10/22/2018   MICRALBCREAT 0.6 04/20/2017   MICRALBCREAT 0.7 03/16/2016   MICRALBCREAT 0.7 03/22/2015   MICRALBCREAT 0.7 10/27/2014   MICRALBCREAT 0.1 02/09/2014   MICRALBCREAT 0.1 01/28/2013   MICRALBCREAT 0.3 06/07/2011   MICRALBCREAT 0.2 11/29/2010   MICRALBCREAT 4.6 10/21/2009   -+ HL; last set of lipids: Lab Results  Component Value Date   CHOL 158 10/22/2018   HDL 65.00 10/22/2018   LDLCALC 59 10/22/2018   LDLDIRECT 62.0 03/22/2015   TRIG 172.0 (H) 10/22/2018   CHOLHDL 2 10/22/2018  On Zocor. - last eye exam was in 01/2019: [facility]. -+ Numbness and tingling in her feet  Vit B12 def. -On 1000 mcg B12 + B complex daily  Review latest B12 levels:: Lab Results  Component Value Date   VITAMINB12 1,216 (H) 02/28/2019   VITAMINB12 890 10/22/2018   VITAMINB12 909 12/05/2017   VITAMINB12 782 01/24/2017   VITAMINB12 969 (H) 03/16/2016   VITAMINB12 503 09/13/2015  VITAMINB12 923 (H) 03/12/2015   VITAMINB12 220 12/10/2014   No results found for: VD25OH   She is on vitamin D 1000 units daily.  ROS: Constitutional: no weight gain/+ weight loss, no fatigue, no subjective hyperthermia, no subjective hypothermia Eyes: no blurry vision, no xerophthalmia ENT: no sore throat, no nodules palpated in neck, no dysphagia, no odynophagia, no hoarseness Cardiovascular: no CP/no SOB/no palpitations/no leg swelling Respiratory: no cough/no SOB/no wheezing Gastrointestinal: no N/no V/no D/no C/no acid reflux Musculoskeletal: no muscle aches/no joint aches, + mm. cramps Skin: no rashes, no hair loss Neurological: no tremors/+ numbness/+ tingling/no dizziness  I  reviewed pt's medications, allergies, PMH, social hx, family hx, and changes were documented in the history of present illness. Otherwise, unchanged from my initial visit note.  Past Medical History:  Diagnosis Date  . Diabetes mellitus    Type II  . Family history of breast cancer   . Family history of breast cancer    No past surgical history on file. History   Social History  . Marital Status: Married    Spouse Name: N/A  . 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 Smoking status: Never Smoker   . Smokeless tobacco: Not on file  . Alcohol Use: Socially; liquor  . Drug Use: No   Social History Narrative   Regular exercise - 2-3x a week   Current Outpatient Medications on File Prior to Visit  Medication Sig Dispense Refill  . ACCU-CHEK FASTCLIX LANCETS MISC Use to check sugars daily 200 each 5  . ACCU-CHEK GUIDE test strip USE TO CHECK SUGAR DAILY AS DIRECTED 100 each 5  . acyclovir (ZOVIRAX) 400 MG tablet TAKE 2 TABLETS BY MOUTH 2 TIMES DAILY PRN. 50 tablet 3  . B Complex-C (SUPER B COMPLEX PO) Take 1 tablet by mouth daily.    . Blood Glucose Monitoring Suppl (ACCU-CHEK GUIDE) w/Device KIT 1 each by Does not apply route daily. 1 kit 0  . cholecalciferol (VITAMIN D) 1000 UNITS tablet Take 1,000 Units by mouth daily.    . Dulaglutide (TRULICITY) 1.5 GY/5.6LS SOPN Inject 1.5 mg into the skin once a week. 4 pen 11  . fluticasone (FLONASE) 50 MCG/ACT nasal spray PLACE 1 SPRAY INTO BOTH NOSTRILS AT BEDTIME 16 g 6  . glipiZIDE (GLUCOTROL) 5 MG tablet Take 1 tablet (5 mg total) by mouth daily. 90 tablet 2  . INVOKANA 100 MG TABS tablet TAKE 1 TABLET BY MOUTH DAILY. 90 tablet 1  . metFORMIN (GLUCOPHAGE) 1000 MG tablet TAKE 1 TABLET BY MOUTH 2 TIMES DAILY WITH A MEAL. 200 tablet 4  . Multiple Vitamins-Minerals (DIASENSE MULTIVITAMIN) TABS Take 1 tablet by mouth daily.      . norethindrone-ethinyl estradiol 1/35 (DASETTA 1/35) tablet  Take 1 tablet by mouth daily. 3 Package 3  . Omeprazole (PRILOSEC PO) Take by mouth as needed.     . Semaglutide, 1 MG/DOSE, (OZEMPIC, 1 MG/DOSE,) 2 MG/1.5ML SOPN Inject 1 mg into the skin once a week. 2 pen 5  . simvastatin (ZOCOR) 20 MG tablet Take 1 tablet (20 mg total) by mouth at bedtime. 90 tablet 3  . vitamin B-12 (CYANOCOBALAMIN) 1000 MCG tablet Take 1,000 mcg by mouth daily.     . vitamin C (ASCORBIC ACID) 500 MG tablet Take 500 mg by mouth daily.     No current facility-administered medications on file prior to visit.    Allergies  Allergen  Reactions  . Penicillins     REACTION: RASH   Family History  Problem Relation Age of Onset  . Diabetes Other        Familly Hx First degree relative  . Breast cancer Mother 3  . Breast cancer Maternal Aunt 64  . Brain cancer Maternal Uncle 58   PE: BP (!) 148/90   Pulse (!) 107   Ht '5\' 6"'  (1.676 m)   Wt 150 lb (68 kg)   SpO2 97%   BMI 24.21 kg/m  Body mass index is 24.21 kg/m.   Wt Readings from Last 3 Encounters:  06/04/19 150 lb (68 kg)  12/30/18 151 lb 12.8 oz (68.9 kg)  10/22/18 155 lb (70.3 kg)   Constitutional: normal weight, in NAD Eyes: PERRLA, EOMI, no exophthalmos ENT: moist mucous membranes, no thyromegaly, no cervical lymphadenopathy Cardiovascular: tachycardia, RR, No MRG Respiratory: CTA B Gastrointestinal: abdomen soft, NT, ND, BS+ Musculoskeletal: no deformities, strength intact in all 4 Skin: moist, warm, no rashes Neurological: no tremor with outstretched hands, DTR normal in all 4  ASSESSMENT: 1. DM2, non-insulin-dependent, controlled, without lon term complications, but with hyperglycemia  2. B12 deficiency  3. HL  PLAN:  1. Patient with longstanding, fairly well-controlled diabetes in the past a complex medication regimen with oral antidiabetic medications and also weekly GLP-1 receptor agonist change from Trulicity to Ozempic at last visit.  She continues to struggle with her diet and we  discussed at length in the past about ways to improve this and increase her insulin resistance especially by decreasing fatty foods and concentrated sweets.  Latest HbA1c was obtained 3 months ago and this was slightly higher, at 7.8%, increased from 7.6%.  At last visit, sugars are higher especially in the morning as she was having late dinners after getting out of work (around 9 PM).  She was also sometimes snacking after dinner.  We discussed about moving dinners earlier but this was not an option due to her work program.  At that time I suggested to switch to a stronger GLP-1 receptor, Ozempic, 1 mg weekly.  We also discussed that if this is not enough, we may need insulin with dinner. -At this visit, however, sugars have improved significantly, except for the last 1 to 2 weeks when she had more stress at work.  She feels that Ozempic is helping and she is tolerating this well.  She also lost several pounds (per our scale, only 1 pound but more per her scale at home) -She did have a weekend without (hours) and she noticed that sugars are much higher.  We will continue the current regimen for now, including glipizide. - I suggested to:  Patient Instructions  Please continue: - Metformin 2000 mg with dinner - Invokana 100 mg daily - Glipizide 5 mg before dinner - Ozempic 1 mg weekly  Please return in 3 to 4 months with your sugar log.  - we checked her HbA1c: 6.8% (better) - advised to check sugars at different times of the day - 1-2x a day, rotating check times - advised for yearly eye exams >> she is UTD - return to clinic in 3-4 months    2. B12 deficiency -At last visit, the B12 level was slightly high on a regimen of 1000 mcg B12 supplement daily + B complex Lab Results  Component Value Date   VITAMINB12 1,216 (H) 02/28/2019  -Since then, she refused the B12 every other day but continues B complex daily.   -  We will continue the current supplement dose  3.  HL -Reviewed latest lipid  panel from 10/2018: LDL goal, triglycerides slightly high, HDL at goal Lab Results  Component Value Date   CHOL 158 10/22/2018   HDL 65.00 10/22/2018   LDLCALC 59 10/22/2018   LDLDIRECT 62.0 03/22/2015   TRIG 172.0 (H) 10/22/2018   CHOLHDL 2 10/22/2018  -Continue Zocor without side effects  Philemon Kingdom, MD PhD [facility]

## 2019-06-04 NOTE — Patient Instructions (Addendum)
Please continue: - Metformin 2000 mg with dinner - Invokana 100 mg daily - Glipizide 5 mg before dinner - Ozempic 1 mg weekly  Please return in 4 months with your sugar log.

## 2019-06-09 MED FILL — ALYACEN 1-35-28 TABLET: 1-35 | 84 days supply | Qty: 84 | Fill #1

## 2019-06-12 ENCOUNTER — Ambulatory Visit
Admission: RE | Admit: 2019-06-12 | Discharge: 2019-06-12 | Disposition: A | Discharge status: Home / Self Care | Payer: 59 | Source: Ambulatory Visit | Attending: Family Medicine | Admitting: Family Medicine

## 2019-06-12 ENCOUNTER — Other Ambulatory Visit: Payer: Self-pay

## 2019-06-12 DIAGNOSIS — Z1231 Encounter for screening mammogram for malignant neoplasm of breast: Secondary | ICD-10-CM | POA: Diagnosis not present

## 2019-06-13 MED FILL — OZEMPIC 1 MG/DOSE SOPN: 2 | 28 days supply | Qty: 3 | Fill #4

## 2019-06-13 MED FILL — ACCU-CHEK GUIDE TEST STRIP: 90 days supply | Qty: 100 | Fill #1

## 2019-06-18 MED FILL — INVOKANA 100 MG TABLET: 100 | 90 days supply | Qty: 90 | Fill #1

## 2019-06-30 MED FILL — ACYCLOVIR 400 MG TABLET: 400 | 13 days supply | Qty: 50 | Fill #2

## 2019-07-09 ENCOUNTER — Ambulatory Visit: Payer: 59 | Attending: Internal Medicine

## 2019-07-09 DIAGNOSIS — Z20822 Contact with and (suspected) exposure to covid-19: Secondary | ICD-10-CM

## 2019-07-09 DIAGNOSIS — Z20828 Contact with and (suspected) exposure to other viral communicable diseases: Secondary | ICD-10-CM | POA: Diagnosis not present

## 2019-07-09 MED FILL — OZEMPIC 1 MG/DOSE SOPN: 2 | 28 days supply | Qty: 3 | Fill #5

## 2019-07-10 LAB — NOVEL CORONAVIRUS, NAA: SARS-CoV-2, NAA: NOT DETECTED

## 2019-07-16 MED FILL — SIMVASTATIN 20 MG TABLET: 20 | 90 days supply | Qty: 90 | Fill #2

## 2019-08-07 ENCOUNTER — Other Ambulatory Visit: Payer: Self-pay | Admitting: Internal Medicine

## 2019-08-07 ENCOUNTER — Other Ambulatory Visit: Payer: Self-pay | Admitting: Family Medicine

## 2019-08-07 DIAGNOSIS — B009 Herpesviral infection, unspecified: Secondary | ICD-10-CM

## 2019-08-07 DIAGNOSIS — E119 Type 2 diabetes mellitus without complications: Secondary | ICD-10-CM

## 2019-08-07 MED FILL — ACYCLOVIR 400 MG TABLET: 400 | 13 days supply | Qty: 50 | Fill #0

## 2019-08-07 MED FILL — OZEMPIC 1 MG/DOSE SOPN: 2 | 28 days supply | Qty: 3 | Fill #0

## 2019-08-07 MED FILL — metFORMIN HCL 1000 MG TABS: 1000 | 90 days supply | Qty: 180 | Fill #0

## 2019-08-25 MED FILL — glipiZIDE 5 MG TABS: 5 | 90 days supply | Qty: 90 | Fill #1

## 2019-09-08 ENCOUNTER — Other Ambulatory Visit: Payer: Self-pay | Admitting: Internal Medicine

## 2019-09-08 MED FILL — INVOKANA 100 MG TABLET: 100 | 90 days supply | Qty: 90 | Fill #0

## 2019-09-08 MED FILL — DASETTA 1-35-28 TABLET: 1-35 | 84 days supply | Qty: 84 | Fill #2

## 2019-09-09 ENCOUNTER — Other Ambulatory Visit: Payer: Self-pay | Admitting: Family Medicine

## 2019-09-09 DIAGNOSIS — T7840XD Allergy, unspecified, subsequent encounter: Secondary | ICD-10-CM

## 2019-09-09 MED FILL — FLUTICASONE PROP 50 MCG SPR: 50 | 60 days supply | Qty: 16 | Fill #0

## 2019-09-11 MED FILL — OZEMPIC 1 MG/DOSE SOPN: 2 | 28 days supply | Qty: 3 | Fill #1

## 2019-09-30 ENCOUNTER — Ambulatory Visit (INDEPENDENT_AMBULATORY_CARE_PROVIDER_SITE_OTHER): Payer: 59 | Admitting: Internal Medicine

## 2019-09-30 ENCOUNTER — Encounter: Payer: Self-pay | Admitting: Internal Medicine

## 2019-09-30 ENCOUNTER — Other Ambulatory Visit: Payer: Self-pay

## 2019-09-30 DIAGNOSIS — E782 Mixed hyperlipidemia: Secondary | ICD-10-CM

## 2019-09-30 DIAGNOSIS — E1165 Type 2 diabetes mellitus with hyperglycemia: Secondary | ICD-10-CM

## 2019-09-30 DIAGNOSIS — E538 Deficiency of other specified B group vitamins: Secondary | ICD-10-CM

## 2019-09-30 NOTE — Progress Notes (Signed)
Patient ID: [name] Young, female   DOB: [birth year], 49 y.o.   MRN: [id number]  Patient location: Home My location: Office Persons participating in the virtual visit: patient, provider  Referring Provider: Darreld Mclean, MD  I connected with the patient on 09/30/19 at  9:44 AM EDT by a video enabled telemedicine application and verified that I am speaking with the correct person.   I discussed the limitations of evaluation and management by telemedicine and the availability of in person appointments. The patient expressed understanding and agreed to proceed.   Details of the encounter are shown below.  HPI: [name] Young is a 49 y.o.-year-old female, presenting for f/u for DM2, dx in 2008 (GDM 1998), non-insulin-dependent, controlled, without long-term complications. Last visit 4 months ago.   DM2: Reviewed HbA1c levels: Lab Results  Component Value Date   HGBA1C 6.8 (A) 06/04/2019   HGBA1C 7.8 (H) 02/28/2019   HGBA1C 7.6 (A) 10/22/2018  Since she was dx with DM >> she lost 50 lbs and was able to come off Lantus.  She is on: - Metformin 2000 mg with dinner - Invokana 100 mg daily - Glipizide 5 mg before dinner - actually taking it at bedtime! - Trulicity 1.5 mg weekly -increased 10/2018 >> changed to Ozempic 1 mg weekly 02/2019-she tolerates this well  Pt checks her sugars 1-2 times a day - ave 131 per 90 days:  - am:  98, 116, 130-170, 204 >> 70, 103-146, 176, 181, 193 (doughnuts) >> 87-140s, 192 (spaghetti) - 2h after b'fast: 163  >> n/c >> 200 >> 205 >> 182 >> n/c - before lunch:   100 >> n/c >> 186 >> 134 >> >> 124-154, 160 >> n/c - >2h after lunch: 158 >> n/c >> 223 >> n/c >> 127, 149 >> n/c - before dinner: 77, 160 >> 117-134 >> 136 >> 167 >> 64 >> 149 - 2h after dinner: n/c >> 124 >> n/c >> 193 >> n/c - bedtime: 144, 157 >> n/c >> 69, 77 >> n/c >> 92 >> n/c - nighttime: 58 >> n/c >> 118 >> n/c Lowest sugar was 49 (alcohol) >> ... 98 >> 64 x1 (before dinner) >>  87; she has hypoglycemia awareness in the 60s. Highest sugar was  204 >> 200 (out of Glipizide) >> 192.  Glucometer: Bayer  Pt's meals are: - Breakfast: Eggs or oatmeal - Lunch: Salad or veggies or tuna salad with saltines or chicken tenders - Dinner: Fast food - Snacks: Graham crackers  -No CKD, last BUN/creatinine:  Lab Results  Component Value Date   BUN 15 02/28/2019   CREATININE 0.80 02/28/2019   She was previously on lisinopril been stopped.  ACR remains normal: Lab Results  Component Value Date   MICRALBCREAT 0.8 10/22/2018   MICRALBCREAT 0.6 04/20/2017   MICRALBCREAT 0.7 03/16/2016   MICRALBCREAT 0.7 03/22/2015   MICRALBCREAT 0.7 10/27/2014   MICRALBCREAT 0.1 02/09/2014   MICRALBCREAT 0.1 01/28/2013   MICRALBCREAT 0.3 06/07/2011   MICRALBCREAT 0.2 11/29/2010   MICRALBCREAT 4.6 10/21/2009   -+ HL; last set of lipids: Lab Results  Component Value Date   CHOL 158 10/22/2018   HDL 65.00 10/22/2018   LDLCALC 59 10/22/2018   LDLDIRECT 62.0 03/22/2015   TRIG 172.0 (H) 10/22/2018   CHOLHDL 2 10/22/2018  On Zocor. - last eye exam was in 01/2019: [facility]. -She has numbness and tingling in her feet  Vit B12 def. -Currently on B complex daily +  1000 mcg B12  Reviewed her B12 levels: Lab Results  Component Value Date   VITAMINB12 1,216 (H) 02/28/2019   VITAMINB12 890 10/22/2018   VITAMINB12 909 12/05/2017   VITAMINB12 782 01/24/2017   VITAMINB12 969 (H) 03/16/2016   VITAMINB12 503 09/13/2015   VITAMINB12 923 (H) 03/12/2015   VITAMINB12 220 12/10/2014   No results found for: VD25OH   She continues on vitamin D 1000 units daily.  ROS: Constitutional: no weight gain/no weight loss, no fatigue, no subjective hyperthermia, no subjective hypothermia Eyes: no blurry vision, no xerophthalmia ENT: no sore throat, no nodules palpated in neck, no dysphagia, no odynophagia, no hoarseness Cardiovascular: no CP/no SOB/no palpitations/no leg  swelling Respiratory: no cough/no SOB/no wheezing Gastrointestinal: no N/no V/no D/no C/no acid reflux Musculoskeletal: no muscle aches/no joint aches Skin: no rashes, no hair loss Neurological: no tremors/+ numbness/+ tingling/no dizziness  I reviewed pt's medications, allergies, PMH, social hx, family hx, and changes were documented in the history of present illness. Otherwise, unchanged from my initial visit note.  Past Medical History:  Diagnosis Date  . Diabetes mellitus    Type II  . Family history of breast cancer   . Family history of breast cancer    No past surgical history on file. History   Social History  . Marital Status: Married    Spouse Name: N/A  . 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 Smoking status: Never Smoker   . Smokeless tobacco: Not on file  . Alcohol Use: Socially; liquor  . Drug Use: No   Social History Narrative   Regular exercise - 2-3x a week   Current Outpatient Medications on File Prior to Visit  Medication Sig Dispense Refill  . ACCU-CHEK FASTCLIX LANCETS MISC Use to check sugars daily 200 each 5  . ACCU-CHEK GUIDE test strip USE TO CHECK SUGAR DAILY AS DIRECTED 100 each 5  . acyclovir (ZOVIRAX) 400 MG tablet TAKE 2 TABLETS BY MOUTH TWICE A DAY AS NEEDED 50 tablet 2  . B Complex-C (SUPER B COMPLEX PO) Take 1 tablet by mouth daily.    . Blood Glucose Monitoring Suppl (ACCU-CHEK GUIDE) w/Device KIT 1 each by Does not apply route daily. 1 kit 0  . cholecalciferol (VITAMIN D) 1000 UNITS tablet Take 1,000 Units by mouth daily.    . Dulaglutide (TRULICITY) 1.5 YW/7.3XT SOPN Inject 1.5 mg into the skin once a week. 4 pen 11  . fluticasone (FLONASE) 50 MCG/ACT nasal spray USE 1 SPRAY INTO BOTH NOSTRILS AT BEDTIME 16 g 6  . glipiZIDE (GLUCOTROL) 5 MG tablet Take 1 tablet (5 mg total) by mouth daily. 90 tablet 2  . INVOKANA 100 MG TABS tablet TAKE 1 TABLET BY MOUTH DAILY. 90 tablet 1  . metFORMIN  (GLUCOPHAGE) 1000 MG tablet TAKE 1 TABLET BY MOUTH 2 TIMES DAILY WITH A MEAL. 180 tablet 3  . Multiple Vitamins-Minerals (DIASENSE MULTIVITAMIN) TABS Take 1 tablet by mouth daily.      . norethindrone-ethinyl estradiol 1/35 (DASETTA 1/35) tablet Take 1 tablet by mouth daily. 3 Package 3  . Omeprazole (PRILOSEC PO) Take by mouth as needed.     Marland Kitchen OZEMPIC, 1 MG/DOSE, 2 MG/1.5ML SOPN INJECT 1 MG INTO THE SKIN ONCE A WEEK. 3 mL 5  . simvastatin (ZOCOR) 20 MG tablet Take 1 tablet (20 mg total) by mouth at bedtime. 90 tablet 3  . vitamin B-12 (CYANOCOBALAMIN) 1000 MCG tablet  Take 1,000 mcg by mouth daily.     . vitamin C (ASCORBIC ACID) 500 MG tablet Take 500 mg by mouth daily.     No current facility-administered medications on file prior to visit.   Allergies  Allergen Reactions  . Penicillins     REACTION: RASH   Family History  Problem Relation Age of Onset  . Diabetes Other        Familly Hx First degree relative  . Breast cancer Mother 71  . Breast cancer Maternal Aunt 64  . Brain cancer Maternal Uncle 58   PE: There were no vitals taken for this visit. There is no height or weight on file to calculate BMI.   Wt Readings from Last 3 Encounters:  06/04/19 150 lb (68 kg)  12/30/18 151 lb 12.8 oz (68.9 kg)  10/22/18 155 lb (70.3 kg)   Constitutional:  in NAD  The physical exam was not performed (virtual visit).  ASSESSMENT: 1. DM2, non-insulin-dependent, controlled, without lon term complications, but with hyperglycemia  2. B12 deficiency  3. HL  PLAN:  1. Patient with longstanding, fairly well-controlled diabetes, on a complex medication regimen with oral antidiabetic medications (Metformin, SGLT 2 inhibitor, sulfonylurea) and also weekly GLP-1 receptor agonist.  At last visit, her HbA1c improved to 6.8% after switching to Ozempic from Trulicity. She is tolerating Ozempic well so we will continue this at the high dose. -At this visit, she is only checking sugars in the  morning and they are mostly at goal.  She had a high blood sugar after she had passed that the day before, and upon questioning, she is actually taking glipizide at bedtime, rather than before dinner.  I strongly advised her to move the glipizide 15 to 30 minutes before dinner and explained why this is important.  I do not feel that we need to make other changes in her regimen but I advised him to also check sugars later in the day, rotating check times - I suggested to:  Patient Instructions  Please continue: - Metformin 2000 mg with dinner - Invokana 100 mg daily - Glipizide 5 mg but move this before dinner - Ozempic 1 mg weekly  Please return in 4 months with your sugar log.  - we will recheck her HbA1c when she returns to the clinic - advised to check sugars at different times of the day - 1-2x a day, rotating check times - advised for yearly eye exams >> she is UTD - return to clinic in 4 months   2. B12 deficiency -Previously, the B12 level was slightly high on a regimen of 1000 mcg B12 supplement daily + B complex Lab Results  Component Value Date   VITAMINB12 1,216 (H) 02/28/2019  -Currently on B complex + B12 daily.   -We will continue the current B12 dose and recheck her level when she returns to the clinic  3.  HL -Reviewed latest lipid panel from 10/2018: LDL at goal, triglycerides slightly high, HDL at goal Lab Results  Component Value Date   CHOL 158 10/22/2018   HDL 65.00 10/22/2018   LDLCALC 59 10/22/2018   LDLDIRECT 62.0 03/22/2015   TRIG 172.0 (H) 10/22/2018   CHOLHDL 2 10/22/2018  -Continues Zocor without side effects  Philemon Kingdom, MD PhD [facility]

## 2019-09-30 NOTE — Patient Instructions (Addendum)
Please continue: - Metformin 2000 mg with dinner - Invokana 100 mg daily - Glipizide 5 mg but move this before dinner - Ozempic 1 mg weekly  Please return in 4 months with your sugar log.

## 2019-10-03 ENCOUNTER — Ambulatory Visit: Payer: 59 | Admitting: Internal Medicine

## 2019-10-09 MED FILL — OZEMPIC 1 MG/DOSE SOPN: 2 | 28 days supply | Qty: 3 | Fill #2

## 2019-10-22 MED FILL — SIMVASTATIN 20 MG TABLET: 20 | 90 days supply | Qty: 90 | Fill #3

## 2019-10-22 MED FILL — ACYCLOVIR 400 MG TABLET: 400 | 13 days supply | Qty: 50 | Fill #1

## 2019-11-03 MED FILL — metFORMIN HCL 1000 MG TABS: 1000 | 90 days supply | Qty: 180 | Fill #1

## 2019-11-18 ENCOUNTER — Other Ambulatory Visit: Payer: Self-pay

## 2019-11-18 ENCOUNTER — Other Ambulatory Visit: Payer: Self-pay | Admitting: Internal Medicine

## 2019-11-18 MED ORDER — FREESTYLE LANCETS MISC: 12 refills | Status: AC

## 2019-11-18 MED ORDER — FREESTYLE LITE DEVI: 0 refills | Status: DC

## 2019-11-18 MED ORDER — FREESTYLE LITE TEST VI STRP: ORAL_STRIP | 12 refills | Status: DC

## 2019-11-18 NOTE — Telephone Encounter (Signed)
Per pharmacy, only [employer] products are covered and requests new RX

## 2019-11-21 ENCOUNTER — Encounter: Payer: Self-pay | Admitting: Internal Medicine

## 2019-11-23 ENCOUNTER — Other Ambulatory Visit: Payer: Self-pay

## 2019-11-23 ENCOUNTER — Ambulatory Visit
Admission: EM | Admit: 2019-11-23 | Discharge: 2019-11-23 | Disposition: A | Discharge status: Home / Self Care | Payer: 59 | Attending: Physician Assistant | Admitting: Physician Assistant

## 2019-11-23 DIAGNOSIS — W5503XA Scratched by cat, initial encounter: Secondary | ICD-10-CM | POA: Diagnosis not present

## 2019-11-23 DIAGNOSIS — Z5189 Encounter for other specified aftercare: Secondary | ICD-10-CM

## 2019-11-23 MED ORDER — DOXYCYCLINE HYCLATE 100 MG PO CAPS: 100.0000 mg | ORAL_CAPSULE | Freq: Two times a day (BID) | ORAL | 0 refills | Status: DC

## 2019-11-23 NOTE — ED Provider Notes (Signed)
[facility]    CSN: [id number] Arrival date & time: 11/23/19  0849      History   Chief Complaint Chief Complaint  Patient presents with  . cat scratch    HPI [name] Young is a 49 y.o. female.   49 year old female with history of DM comes in for cat scratch with possible cat bite that occurred last night. Cleaned wound and dressed area without active bleeding. Denies spreading erythema, warmth, fever, body aches. Cat up to date on immunization. Tetanus up to date.      Past Medical History:  Diagnosis Date  . Diabetes mellitus    Type II  . Family history of breast cancer   . Family history of breast cancer     Patient Active Problem List   Diagnosis Date Noted  . Elevated blood pressure reading 03/20/2017  . Genetic testing 06/26/2016  . Family history of breast cancer   . Type 2 diabetes mellitus with hyperglycemia ([place]) 09/13/2015  . Nonrheumatic mitral valve prolapse 03/29/2015  . Vitamin B12 deficiency 03/12/2015  . Dysplastic nevus of trunk 03/05/2012  . HSV (herpes simplex virus) infection 02/06/2012  . DUB (dysfunctional uterine bleeding) 02/06/2012  . Dysplastic nevus of lower extremity, right 12/12/2011  . Hyperlipidemia 10/23/2008  . ALLERGIC REACTION 07/10/2007    History reviewed. No pertinent surgical history.  OB History    Gravida  1   Para  1   Term      Preterm      AB      Living        SAB      TAB      Ectopic      Multiple      Live Births               Home Medications    Prior to Admission medications   Medication Sig Start Date End Date Taking? Authorizing Provider  acyclovir (ZOVIRAX) 400 MG tablet TAKE 2 TABLETS BY MOUTH TWICE A DAY AS NEEDED 08/07/19   Copland, Gay Filler, MD  B Complex-C (SUPER B COMPLEX PO) Take 1 tablet by mouth daily.    [provider]  Blood Glucose Monitoring Suppl (FREESTYLE LITE) DEVI Use to check blood sugar 2 times a day. 11/18/19   Philemon Kingdom, MD   cholecalciferol (VITAMIN D) 1000 UNITS tablet Take 1,000 Units by mouth daily.    [provider]  doxycycline (VIBRAMYCIN) 100 MG capsule Take 1 capsule (100 mg total) by mouth 2 (two) times daily. 11/23/19   Tasia Catchings, [place] Nunley V, PA-C  fluticasone (FLONASE) 50 MCG/ACT nasal spray USE 1 SPRAY INTO BOTH NOSTRILS AT BEDTIME 09/09/19   Copland, Gay Filler, MD  glipiZIDE (GLUCOTROL) 5 MG tablet Take 1 tablet (5 mg total) by mouth daily. 05/29/19   Philemon Kingdom, MD  glucose blood (FREESTYLE LITE) test strip Use to check blood sugar 2 times a day. 11/18/19   Philemon Kingdom, MD  INVOKANA 100 MG TABS tablet TAKE 1 TABLET BY MOUTH DAILY. 09/08/19   Philemon Kingdom, MD  Lancets (FREESTYLE) lancets Use to check blood sugar 2 times a day. 11/18/19   Philemon Kingdom, MD  metFORMIN (GLUCOPHAGE) 1000 MG tablet TAKE 1 TABLET BY MOUTH 2 TIMES DAILY WITH A MEAL. 08/07/19   Copland, Gay Filler, MD  Multiple Vitamins-Minerals (DIASENSE MULTIVITAMIN) TABS Take 1 tablet by mouth daily.      [provider]  norethindrone-ethinyl estradiol 1/35 (DASETTA 1/35) tablet  Take 1 tablet by mouth daily. 12/30/18   Copland, Gay Filler, MD  Omeprazole (PRILOSEC PO) Take by mouth as needed.     [provider]  OZEMPIC, 1 MG/DOSE, 2 MG/1.5ML SOPN INJECT 1 MG INTO THE SKIN ONCE A WEEK. 08/07/19   Philemon Kingdom, MD  simvastatin (ZOCOR) 20 MG tablet Take 1 tablet (20 mg total) by mouth at bedtime. 01/17/19   Copland, Gay Filler, MD  vitamin B-12 (CYANOCOBALAMIN) 1000 MCG tablet Take 1,000 mcg by mouth daily.     [provider]  vitamin C (ASCORBIC ACID) 500 MG tablet Take 500 mg by mouth daily.    [provider]    Family History Family History  Problem Relation Age of Onset  . Diabetes Other        Familly Hx First degree relative  . Breast cancer Mother 28  . Breast cancer Maternal Aunt 64  . Brain cancer Maternal Uncle 52    Social History Social History   Tobacco Use  . Smoking  status: Never Smoker  . Smokeless tobacco: Never Used  Substance Use Topics  . Alcohol use: No  . Drug use: No     Allergies   Penicillins   Review of Systems Review of Systems  Reason unable to perform ROS: See HPI as above.     Physical Exam Triage Vital Signs ED Triage Vitals  Enc Vitals Group     BP 11/23/19 0854 (!) 147/85     Pulse Rate 11/23/19 0854 75     Resp 11/23/19 0854 16     Temp 11/23/19 0854 98.1 F (36.7 C)     Temp Source 11/23/19 0854 Oral     SpO2 11/23/19 0854 98 %     Weight --      Height --      Head Circumference --      Peak Flow --      Pain Score 11/23/19 0910 2     Pain Loc --      Pain Edu? --      Excl. in [place]? --    No data found.  Updated Vital Signs BP 126/84 (BP Location: Left Arm)   Pulse 75   Temp 98.1 F (36.7 C) (Oral)   Resp 16   SpO2 98%   Visual Acuity Right Eye Distance:   Left Eye Distance:   Bilateral Distance:    Right Eye Near:   Left Eye Near:    Bilateral Near:     Physical Exam Constitutional:      General: She is not in acute distress.    Appearance: Normal appearance. She is well-developed. She is not toxic-appearing or diaphoretic.  HENT:     Head: Normocephalic and atraumatic.  Eyes:     Conjunctiva/sclera: Conjunctivae normal.     Pupils: Pupils are equal, round, and reactive to light.  Pulmonary:     Effort: Pulmonary effort is normal. No respiratory distress.     Comments: Speaking in full sentences without difficulty Musculoskeletal:     Cervical back: Normal range of motion and neck supple.     Comments: Few linear abrasions/ puncture wounds to the right wrist/fingers. No active bleeding, surrounding erythema, swelling. No tenderness to palpation. Full ROM of wrist. NVI  Skin:    General: Skin is warm and dry.  Neurological:     Mental Status: She is alert and oriented to person, place, and time.      UC Treatments /  Results  Labs (all labs ordered are listed, but only abnormal  results are displayed) Labs Reviewed - No data to display  EKG   Radiology No results found.  Procedures Procedures (including critical care time)  Medications Ordered in UC Medications - No data to display  Initial Impression / Assessment and Plan / UC Course  I have reviewed the triage vital signs and the nursing notes.  Pertinent labs & imaging results that were available during my care of the patient were reviewed by me and considered in my medical decision making (see chart for details).    No worries for cat scratch fever at this time. However, patient unsure of cat bite, will cover for cat bite with doxycycline given PCN allergy. Wound care instructions given. Return precautions given.  Final Clinical Impressions(s) / UC Diagnoses   Final diagnoses:  Cat scratch   ED Prescriptions    Medication Sig Dispense Auth. Provider   doxycycline (VIBRAMYCIN) 100 MG capsule Take 1 capsule (100 mg total) by mouth 2 (two) times daily. 14 capsule Ok Edwards, PA-C     PDMP not reviewed this encounter.   Ok Edwards, PA-C 11/23/19 1357

## 2019-11-23 NOTE — ED Triage Notes (Signed)
[name] Young is here for assessment of cat scratch that occurred last night. She reports cat immunization's are up to date.

## 2019-11-23 NOTE — Discharge Instructions (Signed)
Start doxycycline as directed. Warm compress. Clean gently with soap and water. Monitor for spreading redness, warmth, fever, swollen lymph nodes, follow up for reevaluation.

## 2019-11-26 ENCOUNTER — Other Ambulatory Visit: Payer: Self-pay | Admitting: Family Medicine

## 2019-11-26 DIAGNOSIS — Z0000 Encounter for general adult medical examination without abnormal findings: Secondary | ICD-10-CM

## 2019-11-26 MED FILL — glipiZIDE 5 MG TABS: 5 | 90 days supply | Qty: 90 | Fill #2

## 2019-11-26 MED FILL — DASETTA 1-35-28 TABLET: 1-35 | 28 days supply | Qty: 28 | Fill #0

## 2019-12-03 MED FILL — INVOKANA 100 MG TABLET: 100 | 90 days supply | Qty: 90 | Fill #1

## 2019-12-26 ENCOUNTER — Other Ambulatory Visit: Payer: Self-pay | Admitting: Family Medicine

## 2019-12-26 DIAGNOSIS — Z0000 Encounter for general adult medical examination without abnormal findings: Secondary | ICD-10-CM

## 2019-12-26 MED FILL — ACYCLOVIR 400 MG TABLET: 400 | 13 days supply | Qty: 50 | Fill #2

## 2019-12-26 MED FILL — DASETTA 1-35-28 TABLET: 1-35 | 28 days supply | Qty: 28 | Fill #0

## 2020-01-03 NOTE — Progress Notes (Addendum)
[facility] [address], [place], [place] [address] [phone] [id number]  Date:  01/05/2020   Name:  [name] Young   DOB:  1970/08/05   MRN:  [id number]  PCP:  Darreld Mclean, MD    Chief Complaint: Annual Exam   History of Present Illness:  [name] Young is a 49 y.o. 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  She is also working on getting her masters degree in nurse education.  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   Married to [place]  Her diabetes is managed by Dr. Cruzita Lederer, most recent visit in March  COVID-19 series- done in December, she does not have the exact dates handy Pap- will do today Urine micro and labs due today Due for foot exam Mammogram up-to-date Cologuard up-to-date  She has noted some blood and mucus in her stool for a month or so. She did a cologuard a year ago that was negative She is not aware of any family history of inflammatory bowel disease  Her weight has been pretty stable No vomiting No abd pain or cramping, appetite has been normal    Patient Active Problem List   Diagnosis Date Noted  . Elevated blood pressure reading 03/20/2017  . Genetic testing 06/26/2016  . Family history of breast cancer   . Type 2 diabetes mellitus with hyperglycemia ([place]) 09/13/2015  . Nonrheumatic mitral valve prolapse  03/29/2015  . Vitamin B12 deficiency 03/12/2015  . Dysplastic nevus of trunk 03/05/2012  . HSV (herpes simplex virus) infection 02/06/2012  . DUB (dysfunctional uterine bleeding) 02/06/2012  . Dysplastic nevus of lower extremity, right 12/12/2011  . Hyperlipidemia 10/23/2008  . ALLERGIC REACTION 07/10/2007    Past Medical History:  Diagnosis Date  . Diabetes mellitus    Type II  . Family history of breast cancer   . Family history of breast cancer     No past surgical history on file.  Social History   Tobacco Use  . Smoking status: Never Smoker  . Smokeless tobacco: Never Used  Substance Use Topics  . Alcohol use: No  . Drug use: No    Family History  Problem Relation Age of Onset  . Diabetes Other        Familly Hx First degree relative  . Breast cancer Mother 81  . Breast cancer Maternal Aunt 64  . Brain cancer Maternal Uncle 58    Allergies  Allergen Reactions  . Penicillins     REACTION: RASH    Medication list has been reviewed and updated.  Current Outpatient Medications on File Prior to Visit  Medication Sig Dispense Refill  . acyclovir (ZOVIRAX) 400 MG tablet TAKE 2 TABLETS BY MOUTH TWICE A DAY AS NEEDED 50 tablet 2  . B Complex-C (SUPER B COMPLEX PO) Take 1 tablet by mouth daily.    . Blood Glucose Monitoring Suppl (  FREESTYLE LITE) DEVI Use to check blood sugar 2 times a day. 1 each 0  . cholecalciferol (VITAMIN D) 1000 UNITS tablet Take 1,000 Units by mouth daily.    Marland Kitchen DASETTA 1/35 tablet TAKE 1 TABLET BY MOUTH ONCE DAILY 28 tablet 0  . fluticasone (FLONASE) 50 MCG/ACT nasal spray USE 1 SPRAY INTO BOTH NOSTRILS AT BEDTIME 16 g 6  . glipiZIDE (GLUCOTROL) 5 MG tablet Take 1 tablet (5 mg total) by mouth daily. 90 tablet 2  . glucose blood (FREESTYLE LITE) test strip Use to check blood sugar 2 times a day. 200 each 12  . INVOKANA 100 MG TABS tablet TAKE 1 TABLET BY MOUTH DAILY. 90 tablet 1  . Lancets (FREESTYLE) lancets Use to check blood sugar 2 times  a day. 200 each 12  . metFORMIN (GLUCOPHAGE) 1000 MG tablet TAKE 1 TABLET BY MOUTH 2 TIMES DAILY WITH A MEAL. 180 tablet 3  . Multiple Vitamins-Minerals (DIASENSE MULTIVITAMIN) TABS Take 1 tablet by mouth daily.      . Omeprazole (PRILOSEC PO) Take by mouth as needed.     Marland Kitchen OZEMPIC, 1 MG/DOSE, 2 MG/1.5ML SOPN INJECT 1 MG INTO THE SKIN ONCE A WEEK. 3 mL 5  . simvastatin (ZOCOR) 20 MG tablet Take 1 tablet (20 mg total) by mouth at bedtime. 90 tablet 3  . vitamin B-12 (CYANOCOBALAMIN) 1000 MCG tablet Take 1,000 mcg by mouth daily.     . vitamin C (ASCORBIC ACID) 500 MG tablet Take 500 mg by mouth daily.     No current facility-administered medications on file prior to visit.    Review of Systems:  As per HPI- otherwise negative.   Physical Examination: Vitals:   01/05/20 1522  BP: 110/72  Pulse: 85  Resp: 16  SpO2: 98%   Vitals:   01/05/20 1522  Weight: 148 lb (67.1 kg)  Height: 5\' 6"  (1.676 m)   Body mass index is 23.89 kg/m. Ideal Body Weight: Weight in (lb) to have BMI = 25: 154.6  GEN: no acute distress.  Normal weight, looks well HEENT: Atraumatic, Normocephalic.   Bilateral TM wnl, oropharynx normal.  PEERL,EOMI.   Ears and Nose: No external deformity. CV: RRR, No M/G/R. No JVD. No thrill. No extra heart sounds. PULM: CTA B, no wheezes, crackles, rhonchi. No retractions. No resp. distress. No accessory muscle use. ABD: S, NT, ND, +BS. No rebound. No HSM. EXTR: No c/c/e PSYCH: Normally interactive. Conversant.  Breast: normal exam, no masses/ dimpling/ discharge Pelvic: normal, no vaginal lesions or discharge. Uterus normal, no CMT, no adnexal tendereness or masses   Assessment and Plan: Physical exam  Mixed hyperlipidemia - Plan: Lipid panel  Type 2 diabetes mellitus without complication, without long-term current use of insulin (HCC) - Plan: Comprehensive metabolic panel, Hemoglobin A1c, Microalbumin / creatinine urine ratio  Screening for deficiency anemia -  Plan: CBC  Screening for thyroid disorder - Plan: TSH  Encounter for hepatitis C screening test for low risk patient - Plan: Hepatitis C antibody  Screening for HIV (human immunodeficiency virus) - Plan: HIV Antibody (routine testing w rflx)  Family history of B12 deficiency - Plan: B12  Blood in stool - Plan: Ambulatory referral to Gastroenterology  Screening for cervical cancer - Plan: Cytology - PAP  Immunization due - Plan: Varicella-zoster vaccine IM (Shingrix)  Patient is here today for a physical exam.  Pap and labs pending as above Gave first dose of Shingrix She has noticed some blood and mucus in her  stools for about a month.  I referred her to gastroenterology for further evaluation-she did have a recent negative Cologuard test This visit occurred during the SARS-CoV-2 public health emergency.  Safety protocols were in place, including screening questions prior to the visit, additional usage of staff PPE, and extensive cleaning of exam room while observing appropriate contact time as indicated for disinfecting solutions.    Signed Lamar Blinks, MD   addnd 6/29- received her labs as below, message to pt  Results for orders placed or performed in visit on 01/05/20  CBC  Result Value Ref Range   WBC 4.9 4.0 - 10.5 K/uL   RBC 4.09 3.87 - 5.11 Mil/uL   Platelets 252.0 150 - 400 K/uL   Hemoglobin 12.8 12.0 - 15.0 g/dL   HCT 37.8 36 - 46 %   MCV 92.3 78.0 - 100.0 fl   MCHC 33.8 30.0 - 36.0 g/dL   RDW 13.6 11.5 - 15.5 %  Comprehensive metabolic panel  Result Value Ref Range   Sodium 136 135 - 145 mEq/L   Potassium 3.9 3.5 - 5.1 mEq/L   Chloride 99 96 - 112 mEq/L   CO2 29 19 - 32 mEq/L   Glucose, Bld 219 (H) 70 - 99 mg/dL   BUN 16 6 - 23 mg/dL   Creatinine, Ser 0.80 0.40 - 1.20 mg/dL   Total Bilirubin 0.5 0.2 - 1.2 mg/dL   Alkaline Phosphatase 51 39 - 117 U/L   AST 13 0 - 37 U/L   ALT 17 0 - 35 U/L   Total Protein 6.5 6.0 - 8.3 g/dL   Albumin 4.2 3.5 - 5.2  g/dL   GFR 75.48 >60.00 mL/min   Calcium 9.2 8.4 - 10.5 mg/dL  Hemoglobin A1c  Result Value Ref Range   Hgb A1c MFr Bld 7.5 (H) 4.6 - 6.5 %  Lipid panel  Result Value Ref Range   Cholesterol 137 0 - 200 mg/dL   Triglycerides 121.0 0 - 149 mg/dL   HDL 59.40 >39.00 mg/dL   VLDL 24.2 0.0 - 40.0 mg/dL   LDL Cholesterol 53 0 - 99 mg/dL   Total CHOL/HDL Ratio 2    NonHDL 77.58   Microalbumin / creatinine urine ratio  Result Value Ref Range   Microalb, Ur <0.7 0.0 - 1.9 mg/dL   Creatinine,U 41.0 mg/dL   Microalb Creat Ratio 1.7 0.0 - 30.0 mg/g  TSH  Result Value Ref Range   TSH 1.19 0.35 - 4.50 uIU/mL  Hepatitis C antibody  Result Value Ref Range   Hepatitis C Ab NON-REACTIVE NON-REACTI   SIGNAL TO CUT-OFF 0.00 <1.00  HIV Antibody (routine testing w rflx)  Result Value Ref Range   HIV 1&2 Ab, 4th Generation NON-REACTIVE NON-REACTI  B12  Result Value Ref Range   Vitamin B-12 490 211 - 911 pg/mL

## 2020-01-03 NOTE — Patient Instructions (Addendum)
It was good to see you again today, I will be in touch with your labs as soon as possible We will set you up to see Gi regarding your stool changes   You got your first dose of shingles vaccine- shingrix- today.  2nd dose due tin 2-6 months    Health Maintenance, Female Adopting a healthy lifestyle and getting preventive care are important in promoting health and wellness. Ask your health care provider about:  The right schedule for you to have regular tests and exams.  Things you can do on your own to prevent diseases and keep yourself healthy. What should I know about diet, weight, and exercise? Eat a healthy diet   Eat a diet that includes plenty of vegetables, fruits, low-fat dairy products, and lean protein.  Do not eat a lot of foods that are high in solid fats, added sugars, or sodium. Maintain a healthy weight Body mass index (BMI) is used to identify weight problems. It estimates body fat based on height and weight. Your health care provider can help determine your BMI and help you achieve or maintain a healthy weight. Get regular exercise Get regular exercise. This is one of the most important things you can do for your health. Most adults should:  Exercise for at least 150 minutes each week. The exercise should increase your heart rate and make you sweat (moderate-intensity exercise).  Do strengthening exercises at least twice a week. This is in addition to the moderate-intensity exercise.  Spend less time sitting. Even light physical activity can be beneficial. Watch cholesterol and blood lipids Have your blood tested for lipids and cholesterol at 51 years of age, then have this test every 5 years. Have your cholesterol levels checked more often if:  Your lipid or cholesterol levels are high.  You are older than 51 years of age.  You are at high risk for heart disease. What should I know about cancer screening? Depending on your health history and family history,  you may need to have cancer screening at various ages. This may include screening for:  Breast cancer.  Cervical cancer.  Colorectal cancer.  Skin cancer.  Lung cancer. What should I know about heart disease, diabetes, and high blood pressure? Blood pressure and heart disease  High blood pressure causes heart disease and increases the risk of stroke. This is more likely to develop in people who have high blood pressure readings, are of African descent, or are overweight.  Have your blood pressure checked: ? Every 3-5 years if you are 56-53 years of age. ? Every year if you are 74 years old or older. Diabetes Have regular diabetes screenings. This checks your fasting blood sugar level. Have the screening done:  Once every three years after age 83 if you are at a normal weight and have a low risk for diabetes.  More often and at a younger age if you are overweight or have a high risk for diabetes. What should I know about preventing infection? Hepatitis B If you have a higher risk for hepatitis B, you should be screened for this virus. Talk with your health care provider to find out if you are at risk for hepatitis B infection. Hepatitis C Testing is recommended for:  Everyone born from [birth year] through 1967.  Anyone with known risk factors for hepatitis C. Sexually transmitted infections (STIs)  Get screened for STIs, including gonorrhea and chlamydia, if: ? You are sexually active and are younger than 51 years of  age. ? You are older than 51 years of age and your health care provider tells you that you are at risk for this type of infection. ? Your sexual activity has changed since you were last screened, and you are at increased risk for chlamydia or gonorrhea. Ask your health care provider if you are at risk.  Ask your health care provider about whether you are at high risk for HIV. Your health care provider may recommend a prescription medicine to help prevent HIV infection.  If you choose to take medicine to prevent HIV, you should first get tested for HIV. You should then be tested every 3 months for as long as you are taking the medicine. Pregnancy  If you are about to stop having your period (premenopausal) and you may become pregnant, seek counseling before you get pregnant.  Take 400 to 800 micrograms (mcg) of folic acid every day if you become pregnant.  Ask for birth control (contraception) if you want to prevent pregnancy. Osteoporosis and menopause Osteoporosis is a disease in which the bones lose minerals and strength with aging. This can result in bone fractures. If you are 31 years old or older, or if you are at risk for osteoporosis and fractures, ask your health care provider if you should:  Be screened for bone loss.  Take a calcium or vitamin D supplement to lower your risk of fractures.  Be given hormone replacement therapy (HRT) to treat symptoms of menopause. Follow these instructions at home: Lifestyle  Do not use any products that contain nicotine or tobacco, such as cigarettes, e-cigarettes, and chewing tobacco. If you need help quitting, ask your health care provider.  Do not use street drugs.  Do not share needles.  Ask your health care provider for help if you need support or information about quitting drugs. Alcohol use  Do not drink alcohol if: ? Your health care provider tells you not to drink. ? You are pregnant, may be pregnant, or are planning to become pregnant.  If you drink alcohol: ? Limit how much you use to 0-1 drink a day. ? Limit intake if you are breastfeeding.  Be aware of how much alcohol is in your drink. In the [place] one drink equals one 12 oz bottle of beer (355 mL), one 5 oz glass of wine (148 mL), or one 1 oz glass of hard liquor (44 mL). General instructions  Schedule regular health, dental, and eye exams.  Stay current with your vaccines.  Tell your health care provider if: ? You often feel  depressed. ? You have ever been abused or do not feel safe at home. Summary  Adopting a healthy lifestyle and getting preventive care are important in promoting health and wellness.  Follow your health care provider's instructions about healthy diet, exercising, and getting tested or screened for diseases.  Follow your health care provider's instructions on monitoring your cholesterol and blood pressure. This information is not intended to replace advice given to you by your health care provider. Make sure you discuss any questions you have with your health care provider. Document Revised: 06/19/2018 Document Reviewed: 06/19/2018 Elsevier Patient Education  2020 [employer].

## 2020-01-05 ENCOUNTER — Other Ambulatory Visit (HOSPITAL_COMMUNITY)
Admission: RE | Admit: 2020-01-05 | Discharge: 2020-01-05 | Disposition: A | Discharge status: Home / Self Care | Payer: 59 | Source: Ambulatory Visit | Attending: Family Medicine | Admitting: Family Medicine

## 2020-01-05 ENCOUNTER — Encounter: Payer: Self-pay | Admitting: Family Medicine

## 2020-01-05 ENCOUNTER — Ambulatory Visit (INDEPENDENT_AMBULATORY_CARE_PROVIDER_SITE_OTHER): Payer: 59 | Admitting: Family Medicine

## 2020-01-05 ENCOUNTER — Other Ambulatory Visit: Payer: Self-pay

## 2020-01-05 ENCOUNTER — Encounter: Payer: Self-pay | Admitting: Gastroenterology

## 2020-01-05 VITALS — BP 110/72 | HR 85 | Resp 16 | Ht 66.0 in | Wt 148.0 lb

## 2020-01-05 DIAGNOSIS — Z114 Encounter for screening for human immunodeficiency virus [HIV]: Secondary | ICD-10-CM

## 2020-01-05 DIAGNOSIS — Z1329 Encounter for screening for other suspected endocrine disorder: Secondary | ICD-10-CM

## 2020-01-05 DIAGNOSIS — Z0000 Encounter for general adult medical examination without abnormal findings: Secondary | ICD-10-CM | POA: Diagnosis not present

## 2020-01-05 DIAGNOSIS — Z1159 Encounter for screening for other viral diseases: Secondary | ICD-10-CM | POA: Diagnosis not present

## 2020-01-05 DIAGNOSIS — Z124 Encounter for screening for malignant neoplasm of cervix: Secondary | ICD-10-CM | POA: Diagnosis not present

## 2020-01-05 DIAGNOSIS — Z130 Encounter for screening for diseases of the blood and blood-forming organs and certain disorders involving the immune mechanism: Secondary | ICD-10-CM

## 2020-01-05 DIAGNOSIS — E119 Type 2 diabetes mellitus without complications: Secondary | ICD-10-CM

## 2020-01-05 DIAGNOSIS — Z23 Encounter for immunization: Secondary | ICD-10-CM

## 2020-01-05 DIAGNOSIS — E782 Mixed hyperlipidemia: Secondary | ICD-10-CM | POA: Diagnosis not present

## 2020-01-05 DIAGNOSIS — K921 Melena: Secondary | ICD-10-CM

## 2020-01-05 DIAGNOSIS — Z8349 Family history of other endocrine, nutritional and metabolic diseases: Secondary | ICD-10-CM

## 2020-01-06 ENCOUNTER — Encounter: Payer: Self-pay | Admitting: Family Medicine

## 2020-01-06 DIAGNOSIS — R87612 Low grade squamous intraepithelial lesion on cytologic smear of cervix (LGSIL): Secondary | ICD-10-CM | POA: Diagnosis not present

## 2020-01-06 LAB — COMPREHENSIVE METABOLIC PANEL
ALT: 17 U/L (ref 0–35)
AST: 13 U/L (ref 0–37)
Albumin: 4.2 g/dL (ref 3.5–5.2)
Alkaline Phosphatase: 51 U/L (ref 39–117)
BUN: 16 mg/dL (ref 6–23)
CO2: 29 mEq/L (ref 19–32)
Calcium: 9.2 mg/dL (ref 8.4–10.5)
Chloride: 99 mEq/L (ref 96–112)
Creatinine, Ser: 0.8 mg/dL (ref 0.40–1.20)
GFR: 75.48 mL/min (ref >60.00)
Glucose, Bld: 219 mg/dL — ABNORMAL HIGH (ref 70–99)
Potassium: 3.9 mEq/L (ref 3.5–5.1)
Sodium: 136 mEq/L (ref 135–145)
Total Bilirubin: 0.5 mg/dL (ref 0.2–1.2)
Total Protein: 6.5 g/dL (ref 6.0–8.3)

## 2020-01-06 LAB — CBC
HCT: 37.8 % (ref 36.0–46.0)
Hemoglobin: 12.8 g/dL (ref 12.0–15.0)
MCHC: 33.8 g/dL (ref 30.0–36.0)
MCV: 92.3 fl (ref 78.0–100.0)
Platelets: 252 10*3/uL (ref 150.0–400.0)
RBC: 4.09 Mil/uL (ref 3.87–5.11)
RDW: 13.6 % (ref 11.5–15.5)
WBC: 4.9 10*3/uL (ref 4.0–10.5)

## 2020-01-06 LAB — LIPID PANEL
Cholesterol: 137 mg/dL (ref 0–200)
HDL: 59.4 mg/dL (ref >39.00)
LDL Cholesterol: 53 mg/dL (ref 0–99)
NonHDL: 77.58
Total CHOL/HDL Ratio: 2
Triglycerides: 121 mg/dL (ref 0.0–149.0)
VLDL: 24.2 mg/dL (ref 0.0–40.0)

## 2020-01-06 LAB — VITAMIN B12: Vitamin B-12: 490 pg/mL (ref 211–911)

## 2020-01-06 LAB — TSH: TSH: 1.19 u[IU]/mL (ref 0.35–4.50)

## 2020-01-06 LAB — MICROALBUMIN / CREATININE URINE RATIO
Creatinine,U: 41 mg/dL
Microalb Creat Ratio: 1.7 mg/g (ref 0.0–30.0)
Microalb, Ur: <0.7 mg/dL (ref 0.0–1.9)

## 2020-01-06 LAB — HEPATITIS C ANTIBODY
Hepatitis C Ab: NONREACTIVE
SIGNAL TO CUT-OFF: 0 (ref <1.00)

## 2020-01-06 LAB — HEMOGLOBIN A1C: Hgb A1c MFr Bld: 7.5 % — ABNORMAL HIGH (ref 4.6–6.5)

## 2020-01-06 LAB — HIV ANTIBODY (ROUTINE TESTING W REFLEX): HIV 1&2 Ab, 4th Generation: NONREACTIVE

## 2020-01-07 LAB — CYTOLOGY - PAP
Comment: NEGATIVE
High risk HPV: NEGATIVE

## 2020-01-08 ENCOUNTER — Encounter: Payer: Self-pay | Admitting: Family Medicine

## 2020-01-12 ENCOUNTER — Encounter: Payer: Self-pay | Admitting: Family Medicine

## 2020-01-20 ENCOUNTER — Other Ambulatory Visit: Payer: Self-pay | Admitting: Family Medicine

## 2020-01-20 DIAGNOSIS — Z0000 Encounter for general adult medical examination without abnormal findings: Secondary | ICD-10-CM

## 2020-01-20 MED FILL — DASETTA 1-35-28 TABLET: 1-35 | 28 days supply | Qty: 28 | Fill #0

## 2020-01-26 ENCOUNTER — Ambulatory Visit: Payer: 59 | Admitting: Internal Medicine

## 2020-01-26 ENCOUNTER — Other Ambulatory Visit: Payer: Self-pay

## 2020-01-26 ENCOUNTER — Other Ambulatory Visit: Payer: Self-pay | Admitting: Internal Medicine

## 2020-01-26 ENCOUNTER — Encounter: Payer: Self-pay | Admitting: Internal Medicine

## 2020-01-26 VITALS — BP 102/60 | HR 85 | Ht 66.0 in | Wt 150.0 lb

## 2020-01-26 DIAGNOSIS — E1165 Type 2 diabetes mellitus with hyperglycemia: Secondary | ICD-10-CM

## 2020-01-26 DIAGNOSIS — E782 Mixed hyperlipidemia: Secondary | ICD-10-CM | POA: Diagnosis not present

## 2020-01-26 DIAGNOSIS — E538 Deficiency of other specified B group vitamins: Secondary | ICD-10-CM | POA: Diagnosis not present

## 2020-01-26 DIAGNOSIS — E119 Type 2 diabetes mellitus without complications: Secondary | ICD-10-CM

## 2020-01-26 MED ORDER — GLIPIZIDE 5 MG PO TABS: 5.0000 mg | ORAL_TABLET | Freq: Every day | ORAL | 3 refills | Status: DC

## 2020-01-26 MED ORDER — METFORMIN HCL 1000 MG PO TABS: ORAL_TABLET | ORAL | 3 refills | Status: DC

## 2020-01-26 MED FILL — metFORMIN HCL 1000 MG TABS: 1000 | 90 days supply | Qty: 180 | Fill #0

## 2020-01-26 NOTE — Progress Notes (Signed)
Patient ID: [name] Young, female   DOB: [birth year], 49 y.o.   MRN: [id number]  This visit occurred during the SARS-CoV-2 public health emergency.  Safety protocols were in place, including screening questions prior to the visit, additional usage of staff PPE, and extensive cleaning of exam room while observing appropriate contact time as indicated for disinfecting solutions.   HPI: [name] Young is a 49 y.o.-year-old female, presenting for f/u for DM2, dx in 2008 (GDM 1998), non-insulin-dependent, controlled, without long-term complications. Last visit 4 months ago (virtual).  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   DM2: Reviewed HbA1c levels: Lab Results  Component Value Date   HGBA1C 7.5 (H) 01/05/2020   HGBA1C 6.8 (A) 06/04/2019   HGBA1C 7.8 (H) 02/28/2019  Since she was dx with DM >> she lost 50 lbs and was able to come off Lantus.  She is on: - Metformin 2000 mg with dinner - Invokana 100 mg daily - Glipizide 5 mg moved before dinner 29/4765 - Trulicity 1.5 mg weekly -increased 10/2018 >> changed to Ozempic 1 mg weekly 02/2019-she tolerates this well  Pt checks her sugars 1-2 times a day: - am:  70, 103-146, 176, 181, 193 >> 87-140s, 192 (spaghetti) >> 85, 108-173, 186 - 2h after b'fast: 163  >> n/c >> 200 >> 205 >> 182 >> n/c - before lunch:  186 >> 134 >> >> 124-154, 160 >> n/c >> 103, 126 - >2h after lunch: 158 >> n/c >> 223 >> n/c >> 127, 149 >> n/c - before dinner: 117-134 >> 136 >> 167 >> 64 >> 149 >> 102-153 - 2h after dinner: n/c >> 124 >> n/c >> 193 >> n/c - bedtime: 144, 157 >> n/c >> 69, 77 >> n/c >> 92 >> n/c >> 58 - nighttime: 58 >> n/c >> 118 >> n/c Lowest sugar was 49 (alcohol) >> ... 64 x1 (before dinner) >> 87 >> 58 (alcohol); she has hypoglycemia awareness in the 60s. Highest sugar was  200 (out of Glipizide) >>  192.  Glucometer: [employer] >> [employer]  Pt's meals are: - Breakfast: Eggs or oatmeal - Lunch: Salad or veggies or tuna salad with saltines or chicken tenders - Dinner: Fast food -take out - Snacks: Graham crackers  -No CKD, last BUN/creatinine:  Lab Results  Component Value Date   BUN 16 01/05/2020   CREATININE 0.80 01/05/2020   Previously on lisinopril but now off.  ACR remains normal: Lab Results  Component Value Date   MICRALBCREAT 1.7 01/05/2020   MICRALBCREAT 0.8 10/22/2018   MICRALBCREAT 0.6 04/20/2017   MICRALBCREAT 0.7 03/16/2016   MICRALBCREAT 0.7 03/22/2015   MICRALBCREAT 0.7 10/27/2014   MICRALBCREAT 0.1 02/09/2014   MICRALBCREAT 0.1 01/28/2013   MICRALBCREAT 0.3 06/07/2011   MICRALBCREAT 0.2 11/29/2010   -+ HL; last set of lipids: Lab Results  Component Value Date   CHOL 137 01/05/2020   HDL 59.40 01/05/2020   LDLCALC 53 01/05/2020   LDLDIRECT 62.0 03/22/2015   TRIG 121.0 01/05/2020   CHOLHDL 2 01/05/2020  On Zocor 20. - last eye exam was in 01/2019: No [facility]. Coming up. -+ Numbness and tingling in her feet  Vit B12 def. -She is on B complex and 1000 mcg B12 daily  Reviewed vitamin B12 levels: Lab Results  Component Value Date   VITAMINB12  490 01/05/2020   VITAMINB12 1,216 (H) 02/28/2019   VITAMINB12 890 10/22/2018   VITAMINB12 909 12/05/2017   VITAMINB12 782 01/24/2017   VITAMINB12 969 (H) 03/16/2016   VITAMINB12 503 09/13/2015   VITAMINB12 923 (H) 03/12/2015   VITAMINB12 220 12/10/2014   No results found for: VD25OH   She continues on vitamin D 1000 units daily  ROS: Constitutional: no weight gain/no weight loss, no fatigue, no subjective hyperthermia, no subjective hypothermia Eyes: no blurry vision, no xerophthalmia ENT: no sore throat, no nodules palpated in neck, no dysphagia, no odynophagia, no hoarseness Cardiovascular: no CP/no SOB/no palpitations/no leg swelling Respiratory: no cough/no SOB/no  wheezing Gastrointestinal: no N/no V/no D/no C/no acid reflux Musculoskeletal: no muscle aches/no joint aches Skin: no rashes, no hair loss Neurological: no tremors/+ numbness/+ tingling/no dizziness  I reviewed pt's medications, allergies, PMH, social hx, family hx, and changes were documented in the history of present illness. Otherwise, unchanged from my initial visit note.  Past Medical History:  Diagnosis Date  . Diabetes mellitus    Type II  . Family history of breast cancer   . Family history of breast cancer    No past surgical history on file. History   Social History  . Marital Status: Married    Spouse Name: N/A  . 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 Smoking status: Never Smoker   . Smokeless tobacco: Not on file  . Alcohol Use: Socially; liquor  . Drug Use: No   Social History Narrative   Regular exercise - 2-3x a week   Current Outpatient Medications on File Prior to Visit  Medication Sig Dispense Refill  . acyclovir (ZOVIRAX) 400 MG tablet TAKE 2 TABLETS BY MOUTH TWICE A DAY AS NEEDED 50 tablet 2  . B Complex-C (SUPER B COMPLEX PO) Take 1 tablet by mouth daily.    . Blood Glucose Monitoring Suppl (FREESTYLE LITE) DEVI Use to check blood sugar 2 times a day. 1 each 0  . cholecalciferol (VITAMIN D) 1000 UNITS tablet Take 1,000 Units by mouth daily.    Marland Kitchen DASETTA 1/35 tablet TAKE 1 TABLET BY MOUTH ONCE DAILY 28 tablet 10  . fluticasone (FLONASE) 50 MCG/ACT nasal spray USE 1 SPRAY INTO BOTH NOSTRILS AT BEDTIME 16 g 6  . glipiZIDE (GLUCOTROL) 5 MG tablet Take 1 tablet (5 mg total) by mouth daily. 90 tablet 2  . glucose blood (FREESTYLE LITE) test strip Use to check blood sugar 2 times a day. 200 each 12  . INVOKANA 100 MG TABS tablet TAKE 1 TABLET BY MOUTH DAILY. 90 tablet 1  . Lancets (FREESTYLE) lancets Use to check blood sugar 2 times a day. 200 each 12  . metFORMIN (GLUCOPHAGE) 1000 MG tablet TAKE 1 TABLET  BY MOUTH 2 TIMES DAILY WITH A MEAL. 180 tablet 3  . Multiple Vitamins-Minerals (DIASENSE MULTIVITAMIN) TABS Take 1 tablet by mouth daily.      . Omeprazole (PRILOSEC PO) Take by mouth as needed.     Marland Kitchen OZEMPIC, 1 MG/DOSE, 2 MG/1.5ML SOPN INJECT 1 MG INTO THE SKIN ONCE A WEEK. 3 mL 5  . simvastatin (ZOCOR) 20 MG tablet Take 1 tablet (20 mg total) by mouth at bedtime. 90 tablet 3  . vitamin B-12 (CYANOCOBALAMIN) 1000 MCG tablet Take 1,000 mcg by mouth daily.     . vitamin C (ASCORBIC ACID) 500 MG tablet Take 500 mg by mouth daily.  No current facility-administered medications on file prior to visit.   Allergies  Allergen Reactions  . Penicillins     REACTION: RASH   Family History  Problem Relation Age of Onset  . Diabetes Other        Familly Hx First degree relative  . Breast cancer Mother 57  . Breast cancer Maternal Aunt 64  . Brain cancer Maternal Uncle 58   PE: BP 102/60   Pulse 85   Ht 5\' 6"  (1.676 m)   Wt 150 lb (68 kg)   SpO2 97%   BMI 24.21 kg/m  Body mass index is 24.21 kg/m.   Wt Readings from Last 3 Encounters:  01/26/20 150 lb (68 kg)  01/05/20 148 lb (67.1 kg)  06/04/19 150 lb (68 kg)   Constitutional: normal weight, in NAD Eyes: PERRLA, EOMI, no exophthalmos ENT: moist mucous membranes, no thyromegaly, no cervical lymphadenopathy Cardiovascular: RRR, No MRG Respiratory: CTA B Gastrointestinal: abdomen soft, NT, ND, BS+ Musculoskeletal: no deformities, strength intact in all 4 Skin: moist, warm, no rashes Neurological: no tremor with outstretched hands, DTR normal in all 4  ASSESSMENT: 1. DM2, non-insulin-dependent, controlled, without lon term complications, but with hyperglycemia  2. B12 deficiency  3. HL  PLAN:  1. Patient with longstanding, fairly well-controlled diabetes, on a complex medication regimen with Metformin, SGLT2 inhibitor, sulfonylurea and also weekly GLP-1 receptor agonist.  Her sugars improved after switching from Trulicity to  Ozempic.  She is tolerating Ozempic well, without GI side effects. -At last visit, she has only checking her sugars in the morning and they were mostly at goal.  She had a high blood sugars after forgetting to take glipizide before dinner we discussed about the importance of taking the sulfonylurea dose 15 to 30 minutes before this meal.  Otherwise, we did not change her regimen at that time.  Of note, this was a virtual appointment and we could not check an HbA1c then.  She did have another HbA1c checked last month and this was 7.5%, increased from 6.8%. -At this visit, we reviewed her sugars at home per glucometer downloads.  Her sugars many times at goal in the morning but she also has CBGs above target, even in the 180s.  She feels that this is happening whenever she has a late dinner or if she eats takeout, which is frequent nowadays due to being very busy.  She knows that she will need to stop eating out to improve her sugars later in the day and subsequently in the morning.  She has few weeks left of her masters in her [place] became less busy.  She is planning to improve diet and start exercising, too over the next few months. -At this visit, I advised her to move glipizide 15 to 30 minutes before dinner as she is not taking it right before dinner.  Otherwise, we will not change her regimen. - I suggested to:  Patient Instructions  Please continue: - Metformin 2000 mg with dinner - Invokana 100 mg daily - Glipizide 5 mg 15-30 min before dinner - Ozempic 1 mg weekly  Please work on your dinners.   Please return in 4 months with your sugar log.  - advised to check sugars at different times of the day - 1x a day, rotating check times - advised for yearly eye exams >> she is UTD - return to clinic in 4 months  2. B12 deficiency -Reviewed latest B12 level and this was normal:  Lab Results  Component Value Date   BXIDHWYS16 837 01/05/2020  -She continues on B complex + B12  1000 mcg  daily  3.  HL -Reviewed latest lipid panel from 12/2019: LDL excellent, at goal, as are the rest of her lipid fractions Lab Results  Component Value Date   CHOL 137 01/05/2020   HDL 59.40 01/05/2020   LDLCALC 53 01/05/2020   LDLDIRECT 62.0 03/22/2015   TRIG 121.0 01/05/2020   CHOLHDL 2 01/05/2020  -Continue Zocor 20 without side effects  Philemon Kingdom, MD PhD [facility] Endocrinology

## 2020-01-26 NOTE — Patient Instructions (Addendum)
Please continue: - Metformin 2000 mg with dinner - Invokana 100 mg daily - Glipizide 5 mg 15-30 min before dinner - Ozempic 1 mg weekly  Please work on your dinners.   Please return in 4 months with your sugar log.

## 2020-01-27 ENCOUNTER — Other Ambulatory Visit: Payer: Self-pay | Admitting: Family Medicine

## 2020-01-27 DIAGNOSIS — E782 Mixed hyperlipidemia: Secondary | ICD-10-CM

## 2020-01-27 MED FILL — SIMVASTATIN 20 MG TABLET: 20 | 90 days supply | Qty: 90 | Fill #0

## 2020-02-02 DIAGNOSIS — H5213 Myopia, bilateral: Secondary | ICD-10-CM | POA: Diagnosis not present

## 2020-02-02 DIAGNOSIS — E119 Type 2 diabetes mellitus without complications: Secondary | ICD-10-CM | POA: Diagnosis not present

## 2020-02-11 ENCOUNTER — Other Ambulatory Visit (HOSPITAL_COMMUNITY): Payer: Self-pay | Admitting: Internal Medicine

## 2020-02-11 ENCOUNTER — Other Ambulatory Visit: Payer: Self-pay | Admitting: Internal Medicine

## 2020-02-11 MED FILL — OZEMPIC (1 MG/DOSE) 4 MG/3M: 4 | 84 days supply | Qty: 9 | Fill #0

## 2020-02-18 MED FILL — ALYACEN 1-35-28 TABLET: 1-35 | 28 days supply | Qty: 28 | Fill #1

## 2020-02-27 MED FILL — glipiZIDE 5 MG TABS: 5 | 90 days supply | Qty: 90 | Fill #0

## 2020-03-03 ENCOUNTER — Other Ambulatory Visit: Payer: Self-pay | Admitting: Internal Medicine

## 2020-03-03 MED FILL — INVOKANA 100 MG TABLET: 100 | 90 days supply | Qty: 90 | Fill #0

## 2020-03-04 ENCOUNTER — Other Ambulatory Visit: Payer: Self-pay | Admitting: Family Medicine

## 2020-03-04 DIAGNOSIS — B009 Herpesviral infection, unspecified: Secondary | ICD-10-CM

## 2020-03-04 MED FILL — ACYCLOVIR 400 MG TABLET: 400 | 13 days supply | Qty: 50 | Fill #0

## 2020-03-05 ENCOUNTER — Encounter: Payer: Self-pay | Admitting: Gastroenterology

## 2020-03-05 ENCOUNTER — Ambulatory Visit: Payer: 59 | Admitting: Gastroenterology

## 2020-03-05 VITALS — BP 110/70 | HR 96 | Ht 67.0 in | Wt 148.0 lb

## 2020-03-05 DIAGNOSIS — R152 Fecal urgency: Secondary | ICD-10-CM | POA: Diagnosis not present

## 2020-03-05 DIAGNOSIS — R195 Other fecal abnormalities: Secondary | ICD-10-CM

## 2020-03-05 DIAGNOSIS — K219 Gastro-esophageal reflux disease without esophagitis: Secondary | ICD-10-CM | POA: Diagnosis not present

## 2020-03-05 DIAGNOSIS — R197 Diarrhea, unspecified: Secondary | ICD-10-CM | POA: Diagnosis not present

## 2020-03-05 DIAGNOSIS — K921 Melena: Secondary | ICD-10-CM | POA: Diagnosis not present

## 2020-03-05 NOTE — Patient Instructions (Addendum)
If you are age 51 or older, your body mass index should be between 23-30. Your Body mass index is 23.18 kg/m. If this is out of the aforementioned range listed, please consider follow up with your Primary Care Provider.  If you are age 41 or younger, your body mass index should be between 19-25. Your Body mass index is 23.18 kg/m. If this is out of the aformentioned range listed, please consider follow up with your Primary Care Provider.   You have been scheduled for a colonoscopy. Please follow written instructions given to you at your visit today.   If you use inhalers (even only as needed), please bring them with you on the day of your procedure.   It was a pleasure to see you today!  Vito Cirigliano, D.O.

## 2020-03-05 NOTE — Progress Notes (Signed)
Chief Complaint: Loose stools, urgency, hematochezia  Referring Provider:     Darreld Mclean, MD   HPI:     [name] Young is a 49 y.o. female w/ a hx of DM, HLD, B12 def, HTN, referred to the [facility]ology [facility] for evaluation of lower GI sxs.  She c/o loose stools x2 months with intemrittent BRBPR and mucus like stools.Increased stool frequency to 5-6 BM/day (from baseline 1/day). +urgency. No incontinence or seepage. No dyschezia. No nocturnal sxs. No prior similar sxs. 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 Patient is otherwise without preceding Abx, travel and denies new medications, supplements, OTCs.  Has a long history of reflux for many years.  Index symptoms of heartburn.  No dysphagia.  Controlled with Prilosec OTC 20 mg daily.   Normal CBC and CMP in 12/2019. No recent abdominal imaging. No previous EGD/colonoscopy. Cologuard negative in 2020.   No known family history of CRC, GI malignancy, liver disease, pancreatic disease, or IBD.    Past Medical History:  Diagnosis Date  . Diabetes mellitus    Type II  . Family history of breast cancer   . Family history of breast cancer      History reviewed. No pertinent surgical history. Family History  Problem Relation Age of Onset  . Diabetes Other        Familly Hx First degree relative  . Breast cancer Mother 30  . Breast cancer Maternal Aunt 64  . Brain cancer Maternal Uncle 25   Social History   Tobacco Use  . Smoking status: Never Smoker  . Smokeless tobacco: Never Used  Substance Use Topics  . Alcohol use: No  . Drug use: No   Current Outpatient Medications  Medication Sig Dispense Refill  . acyclovir (ZOVIRAX) 400 MG tablet TAKE 2 TABLETS BY MOUTH TWICE A DAY AS NEEDED 50 tablet 2  . B Complex-C (SUPER B COMPLEX PO) Take 1 tablet by mouth daily.    . Blood Glucose Monitoring Suppl (FREESTYLE LITE) DEVI Use to check blood sugar 2  times a day. 1 each 0  . cholecalciferol (VITAMIN D) 1000 UNITS tablet Take 1,000 Units by mouth daily.    Marland Kitchen DASETTA 1/35 tablet TAKE 1 TABLET BY MOUTH ONCE DAILY 28 tablet 10  . fluticasone (FLONASE) 50 MCG/ACT nasal spray USE 1 SPRAY INTO BOTH NOSTRILS AT BEDTIME 16 g 6  . glipiZIDE (GLUCOTROL) 5 MG tablet Take 1 tablet (5 mg total) by mouth daily. 90 tablet 3  . glucose blood (FREESTYLE LITE) test strip Use to check blood sugar 2 times a day. 200 each 12  . INVOKANA 100 MG TABS tablet TAKE 1 TABLET BY MOUTH DAILY. 90 tablet 1  . Lancets (FREESTYLE) lancets Use to check blood sugar 2 times a day. 200 each 12  . metFORMIN (GLUCOPHAGE) 1000 MG tablet TAKE 1 TABLET BY MOUTH 2 TIMES DAILY WITH A MEAL. 180 tablet 3  . Multiple Vitamins-Minerals (DIASENSE MULTIVITAMIN) TABS Take 1 tablet by mouth daily.      . Omeprazole (PRILOSEC PO) Take by mouth as needed.     Marland Kitchen OZEMPIC, 1 MG/DOSE, 2 MG/1.5ML SOPN INJECT 1 MG INTO THE SKIN ONCE A WEEK. 9 mL 5  . simvastatin (ZOCOR) 20 MG tablet TAKE 1 TABLET (20 MG TOTAL) BY MOUTH AT BEDTIME. 90 tablet 3  . vitamin B-12 (CYANOCOBALAMIN) 1000 MCG tablet Take  1,000 mcg by mouth daily.     . vitamin C (ASCORBIC ACID) 500 MG tablet Take 500 mg by mouth daily.     No current facility-administered medications for this visit.   Allergies  Allergen Reactions  . Penicillins     REACTION: RASH     Review of Systems: All systems reviewed and negative except where noted in HPI.     Physical Exam:    Wt Readings from Last 3 Encounters:  03/05/20 148 lb (67.1 kg)  01/26/20 150 lb (68 kg)  01/05/20 148 lb (67.1 kg)    BP 110/70   Pulse 96   Ht '5\' 7"'  (1.702 m)   Wt 148 lb (67.1 kg)   BMI 23.18 kg/m  Constitutional:  Pleasant, in no acute distress. Psychiatric: Normal mood and affect. Behavior is normal. EENT: Pupils normal.  Conjunctivae are normal. No scleral icterus. Neck supple. No cervical LAD. Cardiovascular: Normal rate, regular rhythm. No  edema Pulmonary/chest: Effort normal and breath sounds normal. No wheezing, rales or rhonchi. Abdominal: Soft, nondistended, nontender. Bowel sounds active throughout. There are no masses palpable. No hepatomegaly. Neurological: Alert and oriented to person place and time. Skin: Skin is warm and dry. No rashes noted.   ASSESSMENT AND PLAN;   1) Change in bowel habits 2) Hematochezia 3) Diarrhea 4) Fecal urgency  -Colonoscopy to evaluate for mucosal/luminal pathology -Discussed the role/utility of GI PCR, fecal calprotectin, ESR, CRP, but opted for colonoscopy first  5) GERD -Well-controlled on current medical management -Continue antireflux lifestyle/dietary modifications  The indications, risks, and benefits of colonoscopy were explained to the patient in detail. Risks include but are not limited to bleeding, perforation, adverse reaction to medications, and cardiopulmonary compromise. Sequelae include but are not limited to the possibility of surgery, hospitalization, and mortality. The patient verbalized understanding and wished to proceed. All questions answered, referred to the scheduler and bowel prep ordered. Further recommendations pending results of the exam.    Lavena Bullion, DO, FACG  03/05/2020, 4:16 PM   Copland, Gay Filler, MD

## 2020-03-08 ENCOUNTER — Encounter: Payer: Self-pay | Admitting: Gastroenterology

## 2020-03-09 ENCOUNTER — Other Ambulatory Visit: Payer: Self-pay

## 2020-03-09 ENCOUNTER — Encounter: Payer: Self-pay | Admitting: Gastroenterology

## 2020-03-09 ENCOUNTER — Other Ambulatory Visit (INDEPENDENT_AMBULATORY_CARE_PROVIDER_SITE_OTHER): Payer: 59

## 2020-03-09 ENCOUNTER — Ambulatory Visit (AMBULATORY_SURGERY_CENTER): Payer: 59 | Admitting: Gastroenterology

## 2020-03-09 ENCOUNTER — Other Ambulatory Visit: Payer: Self-pay | Admitting: Gastroenterology

## 2020-03-09 VITALS — BP 116/75 | HR 77 | Temp 98.0°F | Resp 11 | Ht 67.0 in | Wt 148.0 lb

## 2020-03-09 DIAGNOSIS — R195 Other fecal abnormalities: Secondary | ICD-10-CM | POA: Diagnosis not present

## 2020-03-09 DIAGNOSIS — K5120 Ulcerative (chronic) proctitis without complications: Secondary | ICD-10-CM

## 2020-03-09 DIAGNOSIS — R197 Diarrhea, unspecified: Secondary | ICD-10-CM

## 2020-03-09 DIAGNOSIS — R194 Change in bowel habit: Secondary | ICD-10-CM | POA: Diagnosis not present

## 2020-03-09 DIAGNOSIS — K529 Noninfective gastroenteritis and colitis, unspecified: Secondary | ICD-10-CM

## 2020-03-09 DIAGNOSIS — K219 Gastro-esophageal reflux disease without esophagitis: Secondary | ICD-10-CM | POA: Diagnosis not present

## 2020-03-09 DIAGNOSIS — D124 Benign neoplasm of descending colon: Secondary | ICD-10-CM

## 2020-03-09 DIAGNOSIS — K921 Melena: Secondary | ICD-10-CM | POA: Diagnosis not present

## 2020-03-09 DIAGNOSIS — E119 Type 2 diabetes mellitus without complications: Secondary | ICD-10-CM | POA: Diagnosis not present

## 2020-03-09 DIAGNOSIS — D125 Benign neoplasm of sigmoid colon: Secondary | ICD-10-CM

## 2020-03-09 LAB — C-REACTIVE PROTEIN: CRP: <1 mg/dL (ref 0.5–20.0)

## 2020-03-09 LAB — SEDIMENTATION RATE: Sed Rate: 20 mm/hr (ref 0–30)

## 2020-03-09 MED ORDER — MESALAMINE 1000 MG RE SUPP: 1000.0000 mg | Freq: Every day | RECTAL | 3 refills | Status: DC

## 2020-03-09 MED ORDER — SODIUM CHLORIDE 0.9 % IV SOLN: 500.0000 mL | Freq: Once | INTRAVENOUS | Status: DC

## 2020-03-09 MED FILL — MESALAMINE 1000 MG SUPP: 1000 | 30 days supply | Qty: 30 | Fill #0

## 2020-03-09 NOTE — Progress Notes (Signed)
Called to room to assist during endoscopic procedure.  Patient ID and intended procedure confirmed with present staff. Received instructions for my participation in the procedure from the performing physician.  

## 2020-03-09 NOTE — Progress Notes (Signed)
Report to PACU, RN, vss, BBS= Clear.  

## 2020-03-09 NOTE — Patient Instructions (Signed)
Handout given for polyps.  See Dr Vivia Ewing recommendations on the procedure report for new Rx and Lab orders.   YOU HAD AN ENDOSCOPIC PROCEDURE TODAY AT [facility]:   Refer to the procedure report that was given to you for any specific questions about what was found during the examination.  If the procedure report does not answer your questions, please call your gastroenterologist to clarify.  If you requested that your care partner not be given the details of your procedure findings, then the procedure report has been included in a sealed envelope for you to review at your convenience later.  YOU SHOULD EXPECT: Some feelings of bloating in the abdomen. Passage of more gas than usual.  Walking can help get rid of the air that was put into your GI tract during the procedure and reduce the bloating. If you had a lower endoscopy (such as a colonoscopy or flexible sigmoidoscopy) you may notice spotting of blood in your stool or on the toilet paper. If you underwent a bowel prep for your procedure, you may not have a normal bowel movement for a few days.  Please Note:  You might notice some irritation and congestion in your nose or some drainage.  This is from the oxygen used during your procedure.  There is no need for concern and it should clear up in a day or so.  SYMPTOMS TO REPORT IMMEDIATELY:   Following lower endoscopy (colonoscopy or flexible sigmoidoscopy):  Excessive amounts of blood in the stool  Significant tenderness or worsening of abdominal pains  Swelling of the abdomen that is new, acute  Fever of 100F or higher  For urgent or emergent issues, a gastroenterologist can be reached at any hour by calling [phone]. Do not use MyChart messaging for urgent concerns.    DIET:  We do recommend a small meal at first, but then you may proceed to your regular diet.  Drink plenty of fluids but you should avoid alcoholic beverages for 24 hours.  ACTIVITY:  You  should plan to take it easy for the rest of today and you should NOT DRIVE or use heavy machinery until tomorrow (because of the sedation medicines used during the test).    FOLLOW UP: Our staff will call the number listed on your records 48-72 hours following your procedure to check on you and address any questions or concerns that you may have regarding the information given to you following your procedure. If we do not reach you, we will leave a message.  We will attempt to reach you two times.  During this call, we will ask if you have developed any symptoms of COVID 19. If you develop any symptoms (ie: fever, flu-like symptoms, shortness of breath, cough etc.) before then, please call [phone].  If you test positive for Covid 19 in the 2 weeks post procedure, please call and report this information to [place].    If any biopsies were taken you will be contacted by phone, my chart and/ or by letter within the next 1-3 weeks.  Please call us at [phone] if you have not heard about the biopsies in 3 weeks.    SIGNATURES/CONFIDENTIALITY: You and/or your care partner have signed paperwork which will be entered into your electronic medical record.  These signatures attest to the fact that that the information above on your After Visit Summary has been reviewed and is understood.  Full responsibility of the confidentiality of this discharge information lies  with you and/or your care-partner.

## 2020-03-09 NOTE — Progress Notes (Signed)
Pt's states no medical or surgical changes since previsit or office visit.  VS CW  

## 2020-03-09 NOTE — Op Note (Signed)
[facility] Patient Name: [name] Young Procedure Date: 03/09/2020 11:45 AM MRN: [id number] Endoscopist:   , MD Age: 49 Referring MD:  Date of Birth: 11/20/1970 Gender: Female Account #: [id number] Procedure:                Colonoscopy Indications:              Hematochezia, Change in bowel habits, Diarrhea,                            Increased stool frequency with urgency, Mucus like                            stools x2 months Medicines:                Monitored Anesthesia Care Procedure:                Pre-Anesthesia Assessment:                           - Prior to the procedure, a History and Physical                            was performed, and patient medications and                            allergies were reviewed. The patient's tolerance of                            previous anesthesia was also reviewed. The risks                            and benefits of the procedure and the sedation                            options and risks were discussed with the patient.                            All questions were answered, and informed consent                            was obtained. Prior Anticoagulants: The patient has                            taken no previous anticoagulant or antiplatelet                            agents. ASA Grade Assessment: II - A patient with                            mild systemic disease. After reviewing the risks                            and benefits, the patient was deemed in                              satisfactory condition to undergo the procedure.                           After obtaining informed consent, the colonoscope                            was passed under direct vision. Throughout the                            procedure, the patient's blood pressure, pulse, and                            oxygen saturations were monitored continuously. The                            Colonoscope was introduced through the  anus and                            advanced to the the terminal ileum. The colonoscopy                            was performed without difficulty. The patient                            tolerated the procedure well. The quality of the                            bowel preparation was good. The terminal ileum,                            ileocecal valve, appendiceal orifice, and rectum                            were photographed. Scope In: 11:57:28 AM Scope Out: 12:23:34 PM Scope Withdrawal Time: 0 hours 21 minutes 3 seconds  Total Procedure Duration: 0 hours 26 minutes 6 seconds  Findings:                 The perianal and digital rectal examinations were                            normal.                           Three sessile polyps were found in the sigmoid                            colon (1) and descending colon (2). The polyps were                            3 to 6 mm in size. These polyps were removed with a                            cold snare. Resection and retrieval were complete.                              Estimated blood loss was minimal.                           Inflammation characterized by congestion (edema),                            erythema, loss of vascularity and aphthous                            ulcerations was found in a continuous and                            circumferential pattern from the anus to the                            rectum, with transtion to normal mucosa at 20 cm                            from the anal verge. This was graded as Mayo Score                            1 (mild, with erythema, decreased vascular pattern,                            mild friability), and when compared to previous                            examinations, the findings are new. Biopsies were                            taken with a cold forceps for histology. Estimated                            blood loss was minimal.                           Normal mucosa was found  in the sigmoid colon                            through the cecum. Biopsies were taken with a cold                            forceps for histology. Estimated blood loss was                            minimal.                           The terminal ileum appeared normal. Complications:            No immediate complications. Estimated Blood Loss:     Estimated blood loss was minimal. Impression:               - Three 3 to 6 mm polyps in the sigmoid colon and  in the descending colon, removed with a cold snare.                            Resected and retrieved.                           - Inflammation was found from the anus to the                            rectum. This was graded as Mayo Score 1 (mild                            disease), new compared to previous examinations.                            Biopsied. Endosocpic appearance and clinical                            presentation most consistent with newly diagnosed                            ulcerative colitis/proctitis.                           - Normal mucosa in the sigmoid colon to cecum.                            Biopsied.                           - The examined portion of the ileum was normal. Recommendation:           - Patient has a contact number available for                            emergencies. The signs and symptoms of potential                            delayed complications were discussed with the                            patient. Return to normal activities tomorrow.                            Written discharge instructions were provided to the                            patient.                           - Resume previous diet.                           - Continue present medications.                           -   Await pathology results.                           - Repeat colonoscopy for surveillance based on                            pathology results.                           -  Return to GI office at appointment to be                            scheduled.                           - Use Canasa 1000 mg suppository 1 per rectum QHS.                           - Check ESR, CRP, and fecal calprotectin to                            establish baseline inflammatory markers at diagnosis                           - Check GI PCR panel to rule out concomitant                            infection. Gerrit Heck, MD 03/09/2020 12:37:17 PM

## 2020-03-11 ENCOUNTER — Telehealth: Payer: Self-pay

## 2020-03-11 ENCOUNTER — Telehealth: Payer: Self-pay | Admitting: *Deleted

## 2020-03-11 NOTE — Telephone Encounter (Signed)
  Follow up Call-  Call back number 03/09/2020  Post procedure Call Back phone  # [phone]  Permission to leave phone message Yes  Some recent data might be hidden     Left message

## 2020-03-11 NOTE — Telephone Encounter (Signed)
No answer for post procedure call back, Left message for patient and will call back later today.

## 2020-03-22 MED FILL — ALYACEN 1-35-28 TABLET: 1-35 | 28 days supply | Qty: 28 | Fill #2

## 2020-03-31 ENCOUNTER — Other Ambulatory Visit: Payer: 59

## 2020-03-31 DIAGNOSIS — K5120 Ulcerative (chronic) proctitis without complications: Secondary | ICD-10-CM | POA: Diagnosis not present

## 2020-03-31 DIAGNOSIS — R195 Other fecal abnormalities: Secondary | ICD-10-CM

## 2020-03-31 DIAGNOSIS — K921 Melena: Secondary | ICD-10-CM

## 2020-03-31 DIAGNOSIS — R197 Diarrhea, unspecified: Secondary | ICD-10-CM | POA: Diagnosis not present

## 2020-04-02 ENCOUNTER — Encounter: Payer: Self-pay | Admitting: Family Medicine

## 2020-04-03 LAB — GI PROFILE, STOOL, PCR

## 2020-04-03 LAB — CALPROTECTIN, FECAL: Calprotectin, Fecal: 114 ug/g (ref 0–120)

## 2020-04-12 MED FILL — MESALAMINE 1000 MG SUPP: 1000 | 30 days supply | Qty: 30 | Fill #1

## 2020-04-13 MED FILL — ALYACEN 1-35-28 TABLET: 1-35 | 28 days supply | Qty: 28 | Fill #3

## 2020-04-16 ENCOUNTER — Encounter: Payer: Self-pay | Admitting: Gastroenterology

## 2020-04-16 ENCOUNTER — Ambulatory Visit: Payer: 59 | Admitting: Gastroenterology

## 2020-04-16 VITALS — BP 114/66 | HR 93 | Ht 67.0 in | Wt 149.2 lb

## 2020-04-16 DIAGNOSIS — K219 Gastro-esophageal reflux disease without esophagitis: Secondary | ICD-10-CM | POA: Diagnosis not present

## 2020-04-16 DIAGNOSIS — K51919 Ulcerative colitis, unspecified with unspecified complications: Secondary | ICD-10-CM

## 2020-04-16 DIAGNOSIS — Z86010 Personal history of colonic polyps: Secondary | ICD-10-CM | POA: Diagnosis not present

## 2020-04-16 NOTE — Patient Instructions (Addendum)
If you are age 51 or older, your body mass index should be between 23-30. Your Body mass index is 23.38 kg/m. If this is out of the aforementioned range listed, please consider follow up with your Primary Care Provider.  If you are age 78 or younger, your body mass index should be between 19-25. Your Body mass index is 23.38 kg/m. If this is out of the aformentioned range listed, please consider follow up with your Primary Care Provider.   Your provider has requested that you go to the basement level for lab work at [place], [place], [place]. Press "B" on the elevator. The lab is located at the first door on the left as you exit the elevator.  We will call you with results.  Follow up with me in three months.  Please call the office for an appointment as the schedule is not available at this time.   It was a pleasure to see you today!  Vito Cirigliano, D.O.

## 2020-04-16 NOTE — Progress Notes (Signed)
P  Chief Complaint:    Procedure follow-up, Ulcerative Colitis  GI History: [name] Young is a 49 y.o. female w/ a hx of DM, HLD, B12 def, HTN,  who follows in the GI clinic for the following:  1) Ulcerative Colitis (proctitis): Diagnosed with mild ulcerative proctitis on colonoscopy in 02/2020.  Treated with Canasa 1000 mg qhs -Colonoscopy (03/09/2020, Dr. Bryan Lemma): 3 subcentimeter tubular adenomas, mild ulcerative proctitis with transition to normal mucosa at 20 cm (Mayo 1), normal remainder of the colon (normal biopsies), normal TI. -03/09/2020: Normal ESR, CRP.  Calprotectin 114 (borderline elevation).  Negative GI PCR -No EIMs  2) GERD: Longstanding history of reflux for many years.  Index symptoms of heartburn.  No dysphagia.  Well-controlled with Prilosec OTC 20 mg/day.  3) History of colon polyps: 3 subcentimeter tubular adenomas in 02/2020.  Repeat in 3 years (2024)  HPI:     Patient is a 49 y.o. female presenting to the Gastroenterology Clinic for follow-up.  Reports excellent clinical response to treatment of newly diagnosed Ulcerative Colitis with Canasa suppositories. Complete resolution of hematochezia and back to 1 formed stool/day.  Feels much better and without any complaints today.   Review of systems:     No chest pain, no SOB, no fevers, no urinary sx   Past Medical History:  Diagnosis Date  . Diabetes mellitus    Type II  . Family history of breast cancer   . Family history of breast cancer     Patient's surgical history, family medical history, social history, medications and allergies were all reviewed in Epic    Current Outpatient Medications  Medication Sig Dispense Refill  . acyclovir (ZOVIRAX) 400 MG tablet TAKE 2 TABLETS BY MOUTH TWICE A DAY AS NEEDED 50 tablet 2  . B Complex-C (SUPER B COMPLEX PO) Take 1 tablet by mouth daily.    . Blood Glucose Monitoring Suppl (FREESTYLE LITE) DEVI Use to check blood sugar 2 times a day. 1 each 0  .  cholecalciferol (VITAMIN D) 1000 UNITS tablet Take 1,000 Units by mouth daily.    Marland Kitchen DASETTA 1/35 tablet TAKE 1 TABLET BY MOUTH ONCE DAILY 28 tablet 10  . fluticasone (FLONASE) 50 MCG/ACT nasal spray USE 1 SPRAY INTO BOTH NOSTRILS AT BEDTIME 16 g 6  . glipiZIDE (GLUCOTROL) 5 MG tablet Take 1 tablet (5 mg total) by mouth daily. 90 tablet 3  . glucose blood (FREESTYLE LITE) test strip Use to check blood sugar 2 times a day. 200 each 12  . INVOKANA 100 MG TABS tablet TAKE 1 TABLET BY MOUTH DAILY. 90 tablet 1  . Lancets (FREESTYLE) lancets Use to check blood sugar 2 times a day. 200 each 12  . mesalamine (CANASA) 1000 MG suppository Place 1 suppository (1,000 mg total) rectally at bedtime. 30 suppository 3  . metFORMIN (GLUCOPHAGE) 1000 MG tablet TAKE 1 TABLET BY MOUTH 2 TIMES DAILY WITH A MEAL. 180 tablet 3  . Multiple Vitamins-Minerals (DIASENSE MULTIVITAMIN) TABS Take 1 tablet by mouth daily.      . Omeprazole (PRILOSEC PO) Take by mouth as needed.     Marland Kitchen OZEMPIC, 1 MG/DOSE, 2 MG/1.5ML SOPN INJECT 1 MG INTO THE SKIN ONCE A WEEK. 9 mL 5  . simvastatin (ZOCOR) 20 MG tablet TAKE 1 TABLET (20 MG TOTAL) BY MOUTH AT BEDTIME. 90 tablet 3  . vitamin B-12 (CYANOCOBALAMIN) 1000 MCG tablet Take 1,000 mcg by mouth daily.     . vitamin C (ASCORBIC ACID)  500 MG tablet Take 500 mg by mouth daily.     No current facility-administered medications for this visit.    Physical Exam:     BP 114/66   Pulse 93   Ht 5' 7" (1.702 m)   Wt 149 lb 4 oz (67.7 kg)   BMI 23.38 kg/m   GENERAL:  Pleasant female in NAD PSYCH: : Cooperative, normal affect Musculoskeletal:  Normal muscle tone, normal strength NEURO: Alert and oriented x 3, no focal neurologic deficits   IMPRESSION and PLAN:    1) Ulcerative Colitis 49 year old female with newly diagnosed ulcerative proctitis with excellent clinical response to Canasa monotherapy.  Now in clinical remission.  Interestingly, inflammatory markers were normal at time of  diagnosis, so not reliable surrogate for active inflammation in the future.  Discussed pathophysiology of ulcerative colitis at length today and plan for the following:  -Resume Canasa suppository -Discussed possibility of exchanging for PO mesalamine agent in the future, but concern for loss of efficacy given very distal disease -Check BMP.  If normal, can check again in 4-6 weeks, and if again normal, reasonable to just check yearly -RTC in 3 months or sooner as needed -Consider flexible sigmoidoscopy in 6 months to evaluate for deep remission  2) GERD: -Well-controlled on Prilosec.  No change  3) History of colon polyps: -Repeat colonoscopy 2020 for ongoing polyp surveillance  I spent 22 minutes of time, including independent review of results as outlined above, communicating results with the patient directly, face-to-face time with the patient, coordinating care, ordering studies and medications as appropriate, and documentation.            Lavena Bullion ,DO, FACG 04/16/2020, 3:08 PM

## 2020-04-21 ENCOUNTER — Other Ambulatory Visit (INDEPENDENT_AMBULATORY_CARE_PROVIDER_SITE_OTHER): Payer: 59

## 2020-04-21 DIAGNOSIS — K51919 Ulcerative colitis, unspecified with unspecified complications: Secondary | ICD-10-CM | POA: Diagnosis not present

## 2020-04-21 LAB — BASIC METABOLIC PANEL
BUN: 19 mg/dL (ref 6–23)
CO2: 27 mEq/L (ref 19–32)
Calcium: 9.1 mg/dL (ref 8.4–10.5)
Chloride: 100 mEq/L (ref 96–112)
Creatinine, Ser: 0.95 mg/dL (ref 0.40–1.20)
GFR: 68.98 mL/min (ref >60.00)
Glucose, Bld: 229 mg/dL — ABNORMAL HIGH (ref 70–99)
Potassium: 3.5 mEq/L (ref 3.5–5.1)
Sodium: 136 mEq/L (ref 135–145)

## 2020-04-23 ENCOUNTER — Other Ambulatory Visit: Payer: Self-pay | Admitting: Gastroenterology

## 2020-04-23 ENCOUNTER — Encounter: Payer: Self-pay | Admitting: Gastroenterology

## 2020-04-23 DIAGNOSIS — K51919 Ulcerative colitis, unspecified with unspecified complications: Secondary | ICD-10-CM

## 2020-04-28 MED FILL — SIMVASTATIN 20 MG TABLET: 20 | 90 days supply | Qty: 90 | Fill #1

## 2020-05-03 MED FILL — METFORMIN HCL 1000 MG TABS: 1000 | 90 days supply | Qty: 180 | Fill #1

## 2020-05-11 MED FILL — ALYACEN 1-35-28 TABLET: 1-35 | 28 days supply | Qty: 28 | Fill #4

## 2020-05-11 MED FILL — MESALAMINE 1000 MG SUPP: 1000 | 30 days supply | Qty: 30 | Fill #2

## 2020-05-11 MED FILL — OZEMPIC (1 MG/DOSE) 4 MG/3M: 4 | 84 days supply | Qty: 9 | Fill #1

## 2020-05-31 ENCOUNTER — Other Ambulatory Visit: Payer: Self-pay

## 2020-05-31 ENCOUNTER — Ambulatory Visit: Payer: 59 | Admitting: Internal Medicine

## 2020-05-31 ENCOUNTER — Encounter: Payer: Self-pay | Admitting: Internal Medicine

## 2020-05-31 VITALS — BP 120/78 | HR 111 | Ht 67.0 in | Wt 144.5 lb

## 2020-05-31 DIAGNOSIS — E782 Mixed hyperlipidemia: Secondary | ICD-10-CM | POA: Diagnosis not present

## 2020-05-31 DIAGNOSIS — E1165 Type 2 diabetes mellitus with hyperglycemia: Secondary | ICD-10-CM

## 2020-05-31 DIAGNOSIS — E538 Deficiency of other specified B group vitamins: Secondary | ICD-10-CM

## 2020-05-31 LAB — POCT GLYCOSYLATED HEMOGLOBIN (HGB A1C): Hemoglobin A1C: 7 % — AB (ref 4.0–5.6)

## 2020-05-31 MED FILL — glipiZIDE 5 MG TABS: 5 | 90 days supply | Qty: 90 | Fill #1

## 2020-05-31 MED FILL — INVOKANA 100 MG TABLET: 100 | 90 days supply | Qty: 90 | Fill #1

## 2020-05-31 NOTE — Patient Instructions (Signed)
Please continue: - Metformin 2000 mg with dinner - Invokana 100 mg daily - Glipizide 5 mg 15-30 min before dinner - Ozempic 1 mg weekly  Please return in 4 months with your sugar log.

## 2020-05-31 NOTE — Addendum Note (Signed)
Addended by: Lauralyn Primes on: 05/31/2020 03:53 PM   Modules accepted: Orders

## 2020-05-31 NOTE — Progress Notes (Signed)
Patient ID: [name] Young, female   DOB: 1971/05/03, 49 y.o.   MRN: [id number]  This visit occurred during the SARS-CoV-2 public health emergency.  Safety protocols were in place, including screening questions prior to the visit, additional usage of staff PPE, and extensive cleaning of exam room while observing appropriate contact time as indicated for disinfecting solutions.   HPI: [name] Young is a 49 y.o.-year-old female, presenting for f/u for DM2, dx in 2008 (GDM 1998), non-insulin-dependent, controlled, without long-term complications. Last visit 4 months ago.  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 She will graduate next month.  She was recently found to have UC >> now on Mesalamine suppository.  She reduced her alcohol intake.   DM2: Reviewed HbA1c levels: Lab Results  Component Value Date   HGBA1C 7.5 (H) 01/05/2020   HGBA1C 6.8 (A) 06/04/2019   HGBA1C 7.8 (H) 02/28/2019  Since she was dx with DM >> she lost 50 lbs and was able to come off Lantus.  She is on: - Metformin 2000 mg with dinner - Invokana 100 mg daily - Glipizide 5 mg moved before dinner 45/8592 - Trulicity 1.5 mg weekly -increased 10/2018 >> Ozempic 1 mg weekly 02/2019-she tolerates this well  Pt checks her sugars 1-2 times a day: - am: 87-140s, 192 (spaghetti) >> 85, 108-173, 186 >> 115-120, 167 - 2h after b'fast: 200 >> 205 >> 182 >> n/c - before lunch: 124-154, 160 >> n/c >> 103, 126 >> ? - >2h after lunch: 223 >> n/c >> 127, 149 >> n/c - before dinner: 167 >> 64 >> 149 >> 102-153 >> ? - 2h after dinner: 124 >> n/c >> 193 >> n/c - bedtime: 169, 77 >> n/c >> 92 >> n/c >> 58 >> up to 180 - nighttime: 58 >> n/c >> 118 >> n/c Lowest sugar was 49 (alcohol) >> ...  58 (alcohol); she has hypoglycemia awareness in the 60s Highest sugar was  200 (out of Glipizide) >> 192.  Glucometer:  [employer] >> [employer]  Pt's meals are: - Breakfast: Eggs or oatmeal - Lunch: Salad or veggies or tuna salad with saltines or chicken tenders - Dinner: Fast food -take out - Snacks: Graham crackers  -No CKD, last BUN/creatinine:  Lab Results  Component Value Date   BUN 19 04/21/2020   CREATININE 0.95 04/21/2020   ACR normal.  Previously on lisinopril but now off. Lab Results  Component Value Date   MICRALBCREAT 1.7 01/05/2020   MICRALBCREAT 0.8 10/22/2018   MICRALBCREAT 0.6 04/20/2017   MICRALBCREAT 0.7 03/16/2016   MICRALBCREAT 0.7 03/22/2015   MICRALBCREAT 0.7 10/27/2014   MICRALBCREAT 0.1 02/09/2014   MICRALBCREAT 0.1 01/28/2013   MICRALBCREAT 0.3 06/07/2011   MICRALBCREAT 0.2 11/29/2010   -+ HL; last set of lipids: Lab Results  Component Value Date   CHOL 137 01/05/2020   HDL 59.40 01/05/2020   LDLCALC 53 01/05/2020   LDLDIRECT 62.0 03/22/2015   TRIG 121.0 01/05/2020   CHOLHDL 2 01/05/2020  On Zocor 20. - last eye exam was in 01/2020: reportedly No [facility].  -She has numbness and tingling in her feet  Vit B12 def. -She continues on B complex and 1000 mcg B12 daily  Review vitamin B12 levels: Lab Results  Component Value Date   VITAMINB12 490 01/05/2020   VITAMINB12 1,216 (H)  02/28/2019   VITAMINB12 890 10/22/2018   VITAMINB12 909 12/05/2017   VITAMINB12 782 01/24/2017   VITAMINB12 969 (H) 03/16/2016   VITAMINB12 503 09/13/2015   VITAMINB12 923 (H) 03/12/2015   VITAMINB12 220 12/10/2014   No results found for: VD25OH   She continues on vitamin D 1000 units daily.  ROS: Constitutional: no weight gain/no weight loss, no fatigue, no subjective hyperthermia, no subjective hypothermia Eyes: no blurry vision, no xerophthalmia ENT: no sore throat, no nodules palpated in neck, no dysphagia, no odynophagia, no hoarseness Cardiovascular: no CP/no SOB/no palpitations/no leg swelling Respiratory: no cough/no SOB/no wheezing Gastrointestinal: no N/no  V/no D/no C/no acid reflux Musculoskeletal: no muscle aches/no joint aches Skin: no rashes, no hair loss Neurological: no tremors/+ numbness/+ tingling/no dizziness  I reviewed pt's medications, allergies, PMH, social hx, family hx, and changes were documented in the history of present illness. Otherwise, unchanged from my initial visit note.  Past Medical History:  Diagnosis Date  . Diabetes mellitus    Type II  . Family history of breast cancer   . Family history of breast cancer    No past surgical history on file. History   Social History  . Marital Status: Married    Spouse Name: N/A  . 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 Smoking status: Never Smoker   . Smokeless tobacco: Not on file  . Alcohol Use: Socially; liquor  . Drug Use: No   Social History Narrative   Regular exercise - 2-3x a week   Current Outpatient Medications on File Prior to Visit  Medication Sig Dispense Refill  . acyclovir (ZOVIRAX) 400 MG tablet TAKE 2 TABLETS BY MOUTH TWICE A DAY AS NEEDED 50 tablet 2  . B Complex-C (SUPER B COMPLEX PO) Take 1 tablet by mouth daily.    . Blood Glucose Monitoring Suppl (FREESTYLE LITE) DEVI Use to check blood sugar 2 times a day. 1 each 0  . cholecalciferol (VITAMIN D) 1000 UNITS tablet Take 1,000 Units by mouth daily.    Marland Kitchen DASETTA 1/35 tablet TAKE 1 TABLET BY MOUTH ONCE DAILY 28 tablet 10  . fluticasone (FLONASE) 50 MCG/ACT nasal spray USE 1 SPRAY INTO BOTH NOSTRILS AT BEDTIME 16 g 6  . glipiZIDE (GLUCOTROL) 5 MG tablet Take 1 tablet (5 mg total) by mouth daily. 90 tablet 3  . glucose blood (FREESTYLE LITE) test strip Use to check blood sugar 2 times a day. 200 each 12  . INVOKANA 100 MG TABS tablet TAKE 1 TABLET BY MOUTH DAILY. 90 tablet 1  . Lancets (FREESTYLE) lancets Use to check blood sugar 2 times a day. 200 each 12  . mesalamine (CANASA) 1000 MG suppository Place 1 suppository (1,000 mg total) rectally at  bedtime. 30 suppository 3  . metFORMIN (GLUCOPHAGE) 1000 MG tablet TAKE 1 TABLET BY MOUTH 2 TIMES DAILY WITH A MEAL. 180 tablet 3  . Multiple Vitamins-Minerals (DIASENSE MULTIVITAMIN) TABS Take 1 tablet by mouth daily.      . Omeprazole (PRILOSEC PO) Take by mouth as needed.     Marland Kitchen OZEMPIC, 1 MG/DOSE, 2 MG/1.5ML SOPN INJECT 1 MG INTO THE SKIN ONCE A WEEK. 9 mL 5  . simvastatin (ZOCOR) 20 MG tablet TAKE 1 TABLET (20 MG TOTAL) BY MOUTH AT BEDTIME. 90 tablet 3  . vitamin B-12 (CYANOCOBALAMIN) 1000 MCG tablet Take 1,000 mcg by mouth daily.     . vitamin C (ASCORBIC  ACID) 500 MG tablet Take 500 mg by mouth daily.     No current facility-administered medications on file prior to visit.   Allergies  Allergen Reactions  . Penicillins     REACTION: RASH  . Shingrix [Zoster Vac Recomb Adjuvanted]     Patient developed itching at site a couple of days after shot.  Decided not to administer second dose   Family History  Problem Relation Age of Onset  . Diabetes Other        Familly Hx First degree relative  . Breast cancer Mother 67  . Breast cancer Maternal Aunt 64  . Brain cancer Maternal Uncle 58  . Colon cancer Neg Hx   . Esophageal cancer Neg Hx   . Stomach cancer Neg Hx   . Rectal cancer Neg Hx    PE: BP 120/78   Pulse (!) 111   Ht 5\' 7"  (1.702 m)   Wt 144 lb 8 oz (65.5 kg)   SpO2 97%   BMI 22.63 kg/m  Body mass index is 22.63 kg/m.   Wt Readings from Last 3 Encounters:  05/31/20 144 lb 8 oz (65.5 kg)  04/16/20 149 lb 4 oz (67.7 kg)  03/09/20 148 lb (67.1 kg)   Constitutional: normal weight, in NAD Eyes: PERRLA, EOMI, no exophthalmos ENT: moist mucous membranes, no thyromegaly, no cervical lymphadenopathy Cardiovascular: Tachycardia, RR, No MRG Respiratory: CTA B Gastrointestinal: abdomen soft, NT, ND, BS+ Musculoskeletal: no deformities, strength intact in all 4 Skin: moist, warm, no rashes Neurological: no tremor with outstretched hands, DTR normal in all  4  ASSESSMENT: 1. DM2, non-insulin-dependent, controlled, without lon term complications, but with hyperglycemia  2. B12 deficiency  3. HL  PLAN:  1. Patient with longstanding, fairly well-controlled diabetes, on a complex medication regimen with Metformin, SGLT2 inhibitor, sulfonylurea, and also weekly GLP-1 receptor agonist, with an HbA1c above target at last visit, of 7.5%. -At that time, she was very busy studying and working and also breathing problems.  She was eating more out at night.  She was taking glipizide after dinner and we discussed about moving this before dinner.  She was planning to improve her diet and start exercise so we did not make any other changes in her regimen at that time. -At today's visit, her sugars have improved and they are almost all at goal.  She tells me that she was diagnosed with ulcerative colitis since last visit.  This is mild.  She was started on mesalamine and symptoms improved.  To help with this, she cut down alcohol significantly.  She also changed jobs (now works in [employer]) and is now able to get home sooner.  She has earlier dinners and is most likely contributed to her better blood sugars.  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  Also, she does not have as many puppies as before, so overall, she is much less stressed.  I do not feel we need to change her regimen for now. -My only suggestion was to move Ozempic during the weekend, since as of now, she is taking it on Wednesday and she is trying not to forget it. - I suggested to:  Patient Instructions  Please continue: - Metformin 2000 mg with dinner - Invokana 100 mg daily - Glipizide 5 mg 15-30 min before dinner - Ozempic 1 mg weekly  Please return in 4 months with your sugar log.  - we checked her HbA1c: 7.0% (better) - advised  to check sugars at different times of the day - 1x a day, rotating check times - advised for yearly eye exams >> she is UTD - return to  clinic in 4 months  2. B12 deficiency -Reviewed latest B12 level and this was normal Lab Results  Component Value Date   VITAMINB12 490 01/05/2020  -Continues on B complex + B12 1000 mcg daily -We will recheck this at next visit  3.  HL -Reviewed latest lipid panel from 12/2019: All fractions at goal: Lab Results  Component Value Date   CHOL 137 01/05/2020   HDL 59.40 01/05/2020   LDLCALC 53 01/05/2020   LDLDIRECT 62.0 03/22/2015   TRIG 121.0 01/05/2020   CHOLHDL 2 01/05/2020  -Continue Zocor 20 mg without side effects  Philemon Kingdom, MD PhD [facility] Endocr[facility]

## 2020-06-10 MED FILL — ALYACEN 1-35-28 TABLET: 1-35 | 84 days supply | Qty: 84 | Fill #5

## 2020-06-18 MED FILL — ACYCLOVIR 400 MG TABLET: 400 | 13 days supply | Qty: 50 | Fill #1

## 2020-06-18 MED FILL — FLUTICASONE PROP 50 MCG SPR: 50 | 60 days supply | Qty: 16 | Fill #1

## 2020-06-29 MED FILL — MESALAMINE 1000 MG SUPP: 1000 | 30 days supply | Qty: 30 | Fill #3

## 2020-07-27 ENCOUNTER — Other Ambulatory Visit: Payer: Self-pay | Admitting: Family Medicine

## 2020-07-27 DIAGNOSIS — Z0000 Encounter for general adult medical examination without abnormal findings: Secondary | ICD-10-CM

## 2020-07-28 MED FILL — METFORMIN HCL 1000 MG TABS: 1000 | 90 days supply | Qty: 180 | Fill #2

## 2020-07-28 MED FILL — SIMVASTATIN 20 MG TABLET: 20 | 90 days supply | Qty: 90 | Fill #2

## 2020-07-28 MED FILL — FREESTYLE LITE TEST STRIP: 75 days supply | Qty: 150 | Fill #1

## 2020-08-02 ENCOUNTER — Other Ambulatory Visit: Payer: Self-pay | Admitting: Gastroenterology

## 2020-08-02 MED FILL — OZEMPIC (1 MG/DOSE) 4 MG/3M: 4 | 84 days supply | Qty: 9 | Fill #2

## 2020-08-06 ENCOUNTER — Other Ambulatory Visit: Payer: Self-pay | Admitting: Gastroenterology

## 2020-08-06 ENCOUNTER — Telehealth: Payer: Self-pay | Admitting: Gastroenterology

## 2020-08-06 MED ORDER — MESALAMINE 1000 MG RE SUPP: 1000.0000 mg | Freq: Every day | RECTAL | 3 refills | Status: DC

## 2020-08-06 MED FILL — MESALAMINE 1000 MG SUPP: 1000 | 30 days supply | Qty: 30 | Fill #0

## 2020-08-06 NOTE — Telephone Encounter (Signed)
Pt is requesting a refill on her mesalamine.  [place]

## 2020-08-06 NOTE — Telephone Encounter (Signed)
I have sent refill to patients pharmacy.  

## 2020-08-23 ENCOUNTER — Other Ambulatory Visit: Payer: Self-pay | Admitting: Internal Medicine

## 2020-08-23 MED FILL — ALYACEN 1-35-28 TABLET: 1-35 | 84 days supply | Qty: 84 | Fill #6

## 2020-08-25 MED FILL — glipiZIDE 5 MG TABS: 5 | 90 days supply | Qty: 90 | Fill #2

## 2020-08-25 MED FILL — INVOKANA 100 MG TABLET: 100 | 30 days supply | Qty: 30 | Fill #0

## 2020-09-01 MED FILL — MESALAMINE 1000 MG SUPP: 1000 | 30 days supply | Qty: 30 | Fill #1

## 2020-09-08 ENCOUNTER — Ambulatory Visit
Admission: RE | Admit: 2020-09-08 | Discharge: 2020-09-08 | Disposition: A | Discharge status: Home / Self Care | Payer: 59 | Source: Ambulatory Visit | Attending: Family Medicine | Admitting: Family Medicine

## 2020-09-08 ENCOUNTER — Other Ambulatory Visit: Payer: Self-pay

## 2020-09-08 DIAGNOSIS — Z0000 Encounter for general adult medical examination without abnormal findings: Secondary | ICD-10-CM

## 2020-09-08 DIAGNOSIS — Z1231 Encounter for screening mammogram for malignant neoplasm of breast: Secondary | ICD-10-CM | POA: Diagnosis not present

## 2020-09-14 ENCOUNTER — Other Ambulatory Visit: Payer: Self-pay | Admitting: Family Medicine

## 2020-09-14 DIAGNOSIS — R928 Other abnormal and inconclusive findings on diagnostic imaging of breast: Secondary | ICD-10-CM

## 2020-09-27 MED FILL — INVOKANA 100 MG TABLET: 100 | 30 days supply | Qty: 30 | Fill #1

## 2020-09-28 ENCOUNTER — Ambulatory Visit: Payer: 59 | Admitting: Internal Medicine

## 2020-09-28 ENCOUNTER — Other Ambulatory Visit: Payer: Self-pay

## 2020-09-28 ENCOUNTER — Other Ambulatory Visit: Payer: Self-pay | Admitting: Internal Medicine

## 2020-09-28 ENCOUNTER — Encounter: Payer: Self-pay | Admitting: Internal Medicine

## 2020-09-28 VITALS — BP 128/90 | HR 95 | Ht 67.0 in | Wt 141.6 lb

## 2020-09-28 DIAGNOSIS — E538 Deficiency of other specified B group vitamins: Secondary | ICD-10-CM

## 2020-09-28 DIAGNOSIS — E782 Mixed hyperlipidemia: Secondary | ICD-10-CM

## 2020-09-28 DIAGNOSIS — E1165 Type 2 diabetes mellitus with hyperglycemia: Secondary | ICD-10-CM

## 2020-09-28 LAB — COMPREHENSIVE METABOLIC PANEL
ALT: 24 U/L (ref 0–35)
AST: 17 U/L (ref 0–37)
Albumin: 4.4 g/dL (ref 3.5–5.2)
Alkaline Phosphatase: 56 U/L (ref 39–117)
BUN: 19 mg/dL (ref 6–23)
CO2: 26 mEq/L (ref 19–32)
Calcium: 9.5 mg/dL (ref 8.4–10.5)
Chloride: 102 mEq/L (ref 96–112)
Creatinine, Ser: 0.71 mg/dL (ref 0.40–1.20)
GFR: 97.91 mL/min (ref >60.00)
Glucose, Bld: 244 mg/dL — ABNORMAL HIGH (ref 70–99)
Potassium: 3.4 mEq/L — ABNORMAL LOW (ref 3.5–5.1)
Sodium: 138 mEq/L (ref 135–145)
Total Bilirubin: 0.3 mg/dL (ref 0.2–1.2)
Total Protein: 6.9 g/dL (ref 6.0–8.3)

## 2020-09-28 LAB — LIPID PANEL
Cholesterol: 165 mg/dL (ref 0–200)
HDL: 65.7 mg/dL (ref >39.00)
LDL Cholesterol: 77 mg/dL (ref 0–99)
NonHDL: 99.71
Total CHOL/HDL Ratio: 3
Triglycerides: 116 mg/dL (ref 0.0–149.0)
VLDL: 23.2 mg/dL (ref 0.0–40.0)

## 2020-09-28 LAB — POCT GLYCOSYLATED HEMOGLOBIN (HGB A1C): Hemoglobin A1C: 7.3 % — AB (ref 4.0–5.6)

## 2020-09-28 LAB — MICROALBUMIN / CREATININE URINE RATIO
Creatinine,U: 39.5 mg/dL
Microalb Creat Ratio: 1.8 mg/g (ref 0.0–30.0)
Microalb, Ur: <0.7 mg/dL (ref 0.0–1.9)

## 2020-09-28 LAB — VITAMIN B12: Vitamin B-12: 963 pg/mL — ABNORMAL HIGH (ref 211–911)

## 2020-09-28 MED ORDER — LYUMJEV KWIKPEN 100 UNIT/ML ~~LOC~~ SOPN: 6.0000 [IU] | PEN_INJECTOR | Freq: Every day | SUBCUTANEOUS | 3 refills | Status: DC

## 2020-09-28 MED ORDER — INSULIN PEN NEEDLE 32G X 4 MM MISC: 3 refills | Status: DC

## 2020-09-28 MED FILL — LYUMJEV KWIKPEN 100 UNIT/ML: 100 | 84 days supply | Qty: 9 | Fill #0

## 2020-09-28 MED FILL — UNIFINE PENTIPS 32GX5/32: 32G X 4 MM | 90 days supply | Qty: 100 | Fill #0

## 2020-09-28 NOTE — Patient Instructions (Addendum)
Please continue: - Metformin 2000 mg with dinner - Invokana 100 mg daily - Ozempic 1 mg weekly  Please stop: - Glipizide 5 mg 15-30 min before dinner  Start: - Lyumjev 6-10 units before dinner  Please return in 4 months with your sugar log.

## 2020-09-28 NOTE — Progress Notes (Signed)
Patient ID: [name] Young, female   DOB: Jul 24, 1970, 50 y.o.   MRN: [id number]  This visit occurred during the SARS-CoV-2 public health emergency.  Safety protocols were in place, including screening questions prior to the visit, additional usage of staff PPE, and extensive cleaning of exam room while observing appropriate contact time as indicated for disinfecting solutions.   HPI: [name] Young is a 50 y.o.-year-old female, presenting for f/u for DM2, dx in 2008 (GDM 1998), non-insulin-dependent, controlled, without long-term complications. Last visit 4 months ago.  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  She graduated since then.  She also changed her job and working in a short stay now.  Sugars improved.   Before last visit she was found to have ulcerative colitis and started on mesalamine suppositories.  She reduced her alcohol intake. However, since last visit, she had a lot of stress at home with her husband being very sick (MRSA pneumonia) and being hospitalized for several weeks and intubated.  He is now doing better but he is not home yet.  Her sugars increased during this period of time.   DM2: Reviewed HbA1c levels: Lab Results  Component Value Date   HGBA1C 7.0 (A) 05/31/2020   HGBA1C 7.5 (H) 01/05/2020   HGBA1C 6.8 (A) 06/04/2019  Since she was dx with DM >> she lost 50 lbs and was able to come off Lantus.  She is on: - Metformin 2000 mg with dinner - Invokana 100 mg daily - Glipizide 5 mg moved before dinner 36/1443 - Trulicity 1.5 mg weekly -increased 10/2018 >> Ozempic 1 mg weekly 02/2019-she tolerates this well  Pt checks her sugars 1-2 times a day: - am: 85, 108-173, 186 >> 115-120, 167 >> 108, 118-200, 214 - 2h after b'fast: 200 >> 205 >> 182 >> n/c - before lunch: 124-154, 160 >> n/c >> 103, 126 >> n/c - >2h  after lunch: 223 >> n/c >> 127, 149 >> n/c - before dinner:  64 >> 149 >> 102-153 >> 130-173 - 2h after dinner: 124 >> n/c >> 193 >> n/c - bedtime:  92 >> n/c >> 58 >> up to 180 >> n/c - nighttime: 58 >> n/c >> 118 >> n/c Lowest sugar was 49 (alcohol) >> ...  58 (alcohol); she has hypoglycemia awareness in the 60s Highest sugar was  200 (out of Glipizide) >> 192.  Glucometer: [employer] >> [employer]  Pt's meals are: - Breakfast: Eggs or oatmeal - Lunch: Salad or veggies or tuna salad with saltines or chicken tenders - Dinner: Fast food -take out - Snacks: Graham crackers  -No CKD, last BUN/creatinine:  Lab Results  Component Value Date   BUN 19 04/21/2020   CREATININE 0.95 04/21/2020   ACR normal.  Previously on lisinopril but now off. Lab Results  Component Value Date   MICRALBCREAT 1.7 01/05/2020   MICRALBCREAT 0.8 10/22/2018   MICRALBCREAT 0.6 04/20/2017   MICRALBCREAT 0.7 03/16/2016   MICRALBCREAT 0.7 03/22/2015   MICRALBCREAT 0.7 10/27/2014   MICRALBCREAT 0.1 02/09/2014   MICRALBCREAT 0.1 01/28/2013   MICRALBCREAT 0.3 06/07/2011   MICRALBCREAT 0.2 11/29/2010   -+ HL; last set of lipids: Lab Results  Component Value Date   CHOL 137 01/05/2020   HDL 59.40 01/05/2020   LDLCALC 53 01/05/2020   LDLDIRECT 62.0 03/22/2015  TRIG 121.0 01/05/2020   CHOLHDL 2 01/05/2020  On Zocor 20. - last eye exam was in 01/2020: reportedly No [facility].  -She has numbness and tingling in her feet  Vit B12 def. -She continues on B complex and 1000 mcg B12 daily  Review vitamin B12 levels: Lab Results  Component Value Date   VITAMINB12 490 01/05/2020   VITAMINB12 1,216 (H) 02/28/2019   VITAMINB12 890 10/22/2018   VITAMINB12 909 12/05/2017   VITAMINB12 782 01/24/2017   VITAMINB12 969 (H) 03/16/2016   VITAMINB12 503 09/13/2015   VITAMINB12 923 (H) 03/12/2015   VITAMINB12 220 12/10/2014   No results found for: VD25OH   She continues on vitamin D 1000 units  daily.  ROS: Constitutional: no weight gain/+ weight loss, no fatigue, no subjective hyperthermia, no subjective hypothermia Eyes: no blurry vision, no xerophthalmia ENT: no sore throat, no nodules palpated in neck, no dysphagia, no odynophagia, no hoarseness Cardiovascular: no CP/no SOB/no palpitations/no leg swelling Respiratory: no cough/no SOB/no wheezing Gastrointestinal: no N/no V/no D/no C/no acid reflux Musculoskeletal: no muscle aches/no joint aches Skin: no rashes, no hair loss Neurological: no tremors/+ numbness/+ tingling/no dizziness  I reviewed pt's medications, allergies, PMH, social hx, family hx, and changes were documented in the history of present illness. Otherwise, unchanged from my initial visit note.  Past Medical History:  Diagnosis Date  . Diabetes mellitus    Type II  . Family history of breast cancer   . Family history of breast cancer    No past surgical history on file. History   Social History  . Marital Status: Married    Spouse Name: N/A  . 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 Smoking status: Never Smoker   . Smokeless tobacco: Not on file  . Alcohol Use: Socially; liquor  . Drug Use: No   Social History Narrative   Regular exercise - 2-3x a week   Current Outpatient Medications on File Prior to Visit  Medication Sig Dispense Refill  . acyclovir (ZOVIRAX) 400 MG tablet TAKE 2 TABLETS BY MOUTH TWICE A DAY AS NEEDED 50 tablet 2  . B Complex-C (SUPER B COMPLEX PO) Take 1 tablet by mouth daily.    . Blood Glucose Monitoring Suppl (FREESTYLE LITE) DEVI Use to check blood sugar 2 times a day. 1 each 0  . cholecalciferol (VITAMIN D) 1000 UNITS tablet Take 1,000 Units by mouth daily.    Marland Kitchen DASETTA 1/35 tablet TAKE 1 TABLET BY MOUTH ONCE DAILY 28 tablet 10  . fluticasone (FLONASE) 50 MCG/ACT nasal spray USE 1 SPRAY INTO BOTH NOSTRILS AT BEDTIME 16 g 6  . glipiZIDE (GLUCOTROL) 5 MG tablet Take 1  tablet (5 mg total) by mouth daily. 90 tablet 3  . glucose blood (FREESTYLE LITE) test strip Use to check blood sugar 2 times a day. 200 each 12  . INVOKANA 100 MG TABS tablet TAKE 1 TABLET BY MOUTH DAILY. 90 tablet 1  . Lancets (FREESTYLE) lancets Use to check blood sugar 2 times a day. 200 each 12  . mesalamine (CANASA) 1000 MG suppository Place 1 suppository (1,000 mg total) rectally at bedtime. 30 suppository 3  . metFORMIN (GLUCOPHAGE) 1000 MG tablet TAKE 1 TABLET BY MOUTH 2 TIMES DAILY WITH A MEAL. 180 tablet 3  . Multiple Vitamins-Minerals (DIASENSE MULTIVITAMIN) TABS Take 1 tablet by mouth daily.      . Omeprazole (PRILOSEC PO) Take  by mouth as needed.     Marland Kitchen OZEMPIC, 1 MG/DOSE, 2 MG/1.5ML SOPN INJECT 1 MG INTO THE SKIN ONCE A WEEK. 9 mL 5  . simvastatin (ZOCOR) 20 MG tablet TAKE 1 TABLET (20 MG TOTAL) BY MOUTH AT BEDTIME. 90 tablet 3  . vitamin B-12 (CYANOCOBALAMIN) 1000 MCG tablet Take 1,000 mcg by mouth daily.     . vitamin C (ASCORBIC ACID) 500 MG tablet Take 500 mg by mouth daily.     No current facility-administered medications on file prior to visit.   Allergies  Allergen Reactions  . Penicillins     REACTION: RASH  . Shingrix [Zoster Vac Recomb Adjuvanted]     Patient developed itching at site a couple of days after shot.  Decided not to administer second dose   Family History  Problem Relation Age of Onset  . Diabetes Other        Familly Hx First degree relative  . Breast cancer Mother 40  . Breast cancer Maternal Aunt 64  . Brain cancer Maternal Uncle 58  . Colon cancer Neg Hx   . Esophageal cancer Neg Hx   . Stomach cancer Neg Hx   . Rectal cancer Neg Hx    PE: BP 128/90 (BP Location: Right Arm, Patient Position: Sitting, Cuff Size: Normal)   Pulse 95   Ht 5\' 7"  (1.702 m)   Wt 141 lb 9.6 oz (64.2 kg)   SpO2 98%   BMI 22.18 kg/m  Body mass index is 22.18 kg/m.   Wt Readings from Last 3 Encounters:  09/28/20 141 lb 9.6 oz (64.2 kg)  05/31/20 144 lb 8 oz  (65.5 kg)  04/16/20 149 lb 4 oz (67.7 kg)   Constitutional: normal weight, in NAD Eyes: PERRLA, EOMI, no exophthalmos ENT: moist mucous membranes, no thyromegaly, no cervical lymphadenopathy Cardiovascular: Tachycardia, RR, No MRG Respiratory: CTA B Gastrointestinal: abdomen soft, NT, ND, BS+ Musculoskeletal: no deformities, strength intact in all 4 Skin: moist, warm, no rashes Neurological: no tremor with outstretched hands, DTR normal in all 4  ASSESSMENT: 1. DM2, non-insulin-dependent, controlled, without lon term complications, but with hyperglycemia  2. B12 deficiency  3. HL  PLAN:  1. Patient with longstanding, fairly well-controlled diabetes, on a complex medication regimen with Metformin, SGLT2 inhibitor, sulfonylurea and also weekly GLP-1 receptor agonist, with improved control at last visit with most sugars at goal and an HbA1c of 7.0%.  Before this, she cut out alcohol and was started on mesalamine for mild UC.  She also changed jobs and finished her masters degree.  Overall, her stress was reduced at last visit.  We moved Ozempic during the weekend to help her remember to take it, but otherwise we did not change the regimen at last visit. -At today's visit, we reviewed her blood sugars at home and it appears that they are usually above target.  Her largest meal is at night so at this visit we discussed about adding rapid acting insulin before this meal.  I advised her to vary between 6 to 10 units of Lyumjev injected at the start of the meal.  We will stop glipizide before dinner.  I advised her that the main advantage of using the insulin before the meal is the fact that it is adjustable, based on the meals. - I suggested to:  Patient Instructions  Please continue: - Metformin 2000 mg with dinner - Invokana 100 mg daily - Ozempic 1 mg weekly  Please stop: - Glipizide 5 mg  15-30 min before dinner  Start: - Lyumjev 6-10 units before dinner  Please return in 4 months  with your sugar log.  - we checked her HbA1c: 7.3% - higher - advised to check sugars at different times of the day - 1x a day, rotating check times - advised for yearly eye exams >> she is UTD - will check annual labs today - return to clinic in 4 months  2. B12 deficiency -Review latest B12 level and this was normal: Lab Results  Component Value Date   VITAMINB12 490 01/05/2020  -She continues on  B12 1000 mcg daily + MVI  -We will recheck her B12 level now  3.  HL -Reviewed latest lipid panel from 12/2019: All fractions at goal: Lab Results  Component Value Date   CHOL 137 01/05/2020   HDL 59.40 01/05/2020   LDLCALC 53 01/05/2020   LDLDIRECT 62.0 03/22/2015   TRIG 121.0 01/05/2020   CHOLHDL 2 01/05/2020  -Continue Zocor 20 mg daily without side effects  Component     Latest Ref Rng & Units 09/28/2020          Sodium     135 - 145 mEq/L 138  Potassium     3.5 - 5.1 mEq/L 3.4 (L)  Chloride     96 - 112 mEq/L 102  CO2     19 - 32 mEq/L 26  Glucose     70 - 99 mg/dL 244 (H)  BUN     6 - 23 mg/dL 19  Creatinine     0.40 - 1.20 mg/dL 0.71  Total Bilirubin     0.2 - 1.2 mg/dL 0.3  Alkaline Phosphatase     39 - 117 U/L 56  AST     0 - 37 U/L 17  ALT     0 - 35 U/L 24  Total Protein     6.0 - 8.3 g/dL 6.9  Albumin     3.5 - 5.2 g/dL 4.4  Calcium     8.4 - 10.5 mg/dL 9.5  GFR     >60.00 mL/min 97.91  Cholesterol     0 - 200 mg/dL 165  Triglycerides     0.0 - 149.0 mg/dL 116.0  HDL Cholesterol     >39.00 mg/dL 65.70  VLDL     0.0 - 40.0 mg/dL 23.2  LDL (calc)     0 - 99 mg/dL 77  Total CHOL/HDL Ratio      3  NonHDL      99.71  Microalb, Ur     0.0 - 1.9 mg/dL <0.7  Creatinine,U     mg/dL 39.5  MICROALB/CREAT RATIO     0.0 - 30.0 mg/g 1.8  Hemoglobin A1C     4.0 - 5.6 % 7.3 (A)  Vitamin B12     211 - 911 pg/mL 963 (H)   Labs are at goal, with the exception of a slightly high vitamin B12.  We can continue the same dose of B12 supplement,  however. ACR is normal. LDL at goal. Glucose is quite high-please see plan above to start insulin before dinner. Potassium level is slightly low -will advise her to increase dietary potassium.  Philemon Kingdom, MD PhD [facility] Endocrin[facility]

## 2020-09-30 ENCOUNTER — Ambulatory Visit
Admission: RE | Admit: 2020-09-30 | Discharge: 2020-09-30 | Disposition: A | Discharge status: Home / Self Care | Payer: 59 | Source: Ambulatory Visit | Attending: Family Medicine | Admitting: Family Medicine

## 2020-09-30 ENCOUNTER — Ambulatory Visit: Payer: 59

## 2020-09-30 ENCOUNTER — Other Ambulatory Visit: Payer: Self-pay

## 2020-09-30 DIAGNOSIS — R928 Other abnormal and inconclusive findings on diagnostic imaging of breast: Secondary | ICD-10-CM

## 2020-10-04 MED FILL — MESALAMINE 1000 MG SUPP: 1000 | 30 days supply | Qty: 30 | Fill #2

## 2020-10-06 NOTE — Telephone Encounter (Signed)
Rx already sent to pharmacy.

## 2020-10-25 MED FILL — Metformin HCl Tab 1000 MG: ORAL | 90 days supply | Qty: 180 | Fill #0 | Status: AC

## 2020-10-25 MED FILL — Canagliflozin Tab 100 MG: ORAL | 30 days supply | Qty: 30 | Fill #0 | Status: AC

## 2020-10-25 MED FILL — Simvastatin Tab 20 MG: ORAL | 90 days supply | Qty: 90 | Fill #0 | Status: AC

## 2020-10-26 ENCOUNTER — Other Ambulatory Visit (HOSPITAL_COMMUNITY): Payer: Self-pay

## 2020-10-30 MED FILL — Semaglutide Soln Pen-inj 1 MG/DOSE (4 MG/3ML): SUBCUTANEOUS | 84 days supply | Qty: 9 | Fill #0 | Status: AC

## 2020-10-31 MED FILL — Mesalamine Suppos 1000 MG: RECTAL | 30 days supply | Qty: 30 | Fill #0 | Status: AC

## 2020-11-01 ENCOUNTER — Other Ambulatory Visit (HOSPITAL_COMMUNITY): Payer: Self-pay

## 2020-11-16 ENCOUNTER — Other Ambulatory Visit: Payer: Self-pay | Admitting: Family Medicine

## 2020-11-16 ENCOUNTER — Other Ambulatory Visit (HOSPITAL_COMMUNITY): Payer: Self-pay

## 2020-11-16 ENCOUNTER — Encounter: Payer: Self-pay | Admitting: Family Medicine

## 2020-11-16 DIAGNOSIS — Z0000 Encounter for general adult medical examination without abnormal findings: Secondary | ICD-10-CM

## 2020-11-16 MED ORDER — ALYACEN 1/35 1-35 MG-MCG PO TABS
1.0000 | ORAL_TABLET | Freq: Every day | ORAL | 3 refills | Status: DC
  Filled 2020-11-16: qty 84, 84d supply, fill #0
  Filled 2021-02-10: qty 84, 84d supply, fill #1
  Filled 2021-04-26: qty 84, 84d supply, fill #2
  Filled 2021-07-26: qty 84, 84d supply, fill #3

## 2020-11-16 NOTE — Telephone Encounter (Signed)
Pharmacy is asking to change to norethindrone-ethinyl estradiol 1/35 (ALAYCEN 1/35) tablet.   If changing please send in new rx with sig and qty

## 2020-11-28 MED FILL — Canagliflozin Tab 100 MG: ORAL | 30 days supply | Qty: 30 | Fill #1 | Status: AC

## 2020-11-29 ENCOUNTER — Other Ambulatory Visit (HOSPITAL_COMMUNITY): Payer: Self-pay

## 2020-11-29 MED FILL — Acyclovir Tab 400 MG: ORAL | 12 days supply | Qty: 50 | Fill #0 | Status: AC

## 2020-12-03 ENCOUNTER — Other Ambulatory Visit (HOSPITAL_COMMUNITY): Payer: Self-pay

## 2020-12-03 MED FILL — Insulin Lispro-aabc Soln Pen-inj 100 Unit/ML (1 Unit Dial): SUBCUTANEOUS | 90 days supply | Qty: 9 | Fill #0 | Status: AC

## 2020-12-07 ENCOUNTER — Other Ambulatory Visit (HOSPITAL_COMMUNITY): Payer: Self-pay

## 2020-12-08 ENCOUNTER — Other Ambulatory Visit (HOSPITAL_COMMUNITY): Payer: Self-pay

## 2020-12-08 ENCOUNTER — Other Ambulatory Visit: Payer: Self-pay | Admitting: Gastroenterology

## 2020-12-08 MED ORDER — MESALAMINE 1000 MG RE SUPP
1000.0000 mg | Freq: Every evening | RECTAL | 3 refills | Status: DC
  Filled 2020-12-08: qty 30, 30d supply, fill #0
  Filled 2021-01-10: qty 30, 30d supply, fill #1
  Filled 2021-02-13: qty 30, 30d supply, fill #2
  Filled 2021-03-23: qty 30, 30d supply, fill #3

## 2020-12-09 ENCOUNTER — Other Ambulatory Visit (HOSPITAL_COMMUNITY): Payer: Self-pay

## 2020-12-26 NOTE — Progress Notes (Addendum)
[facility] [address], [place], [place] [address] [phone]  Date:  12/29/2020   Name:  [name] Young   DOB:  1970/12/27   MRN:  [id number]  PCP:  Darreld Mclean, MD    Chief Complaint: Annual Exam (Pt states no fasting )   History of Present Illness:  [name] Young is a 50 y.o. 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  She is also working on getting her masters degree in nurse education.  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   Married to [place]  She moved to short stay surgical - overall this is a good change for her Covid series- done and boosted  Foot exam Eye exam- about one year ago, she is scheduled for this summer Shingrix 2nd dose - she had a lot of redness and warmth at the site with 1st dose,we have not decided about giving a 2nd dose yet  Colon, mammo, pap all UTD She will repeat colon in 3 years  She had labs done in March of this year   Her husband had a very serious pneumonia and was inpt for 41 days.  He survived thankfully- he is doing much better and has also been able to get off his chronic pain meds.   Patient Active Problem List   Diagnosis Date Noted   Elevated blood pressure reading 03/20/2017   Genetic testing 06/26/2016   Family  history of breast cancer    Type 2 diabetes mellitus with hyperglycemia ([place]) 09/13/2015   Nonrheumatic mitral valve prolapse 03/29/2015   Vitamin B12 deficiency 03/12/2015   Dysplastic nevus of trunk 03/05/2012   HSV (herpes simplex virus) infection 02/06/2012   DUB (dysfunctional uterine bleeding) 02/06/2012   Dysplastic nevus of lower extremity, right 12/12/2011   Hyperlipidemia 10/23/2008   ALLERGIC REACTION 07/10/2007    Past Medical History:  Diagnosis Date   Diabetes mellitus    Type II   Family history of breast cancer    Family history of breast cancer     No past surgical history on file.  Social History   Tobacco Use   Smoking status: Never   Smokeless tobacco: Never  Vaping Use   Vaping Use: Never used  Substance Use Topics   Alcohol use: Yes    Alcohol/week: 2.0 standard drinks    Types: 2 Standard drinks or equivalent per week    Comment: socially   Drug use: No    Family History  Problem Relation Age of Onset   Diabetes Other        Familly Hx First degree relative   Breast cancer Mother 60   Breast cancer Maternal Aunt 64   Brain cancer Maternal Uncle 58   Colon cancer Neg Hx    Esophageal  cancer Neg Hx    Stomach cancer Neg Hx    Rectal cancer Neg Hx     Allergies  Allergen Reactions   Penicillins     REACTION: RASH   Shingrix [Zoster Vac Recomb Adjuvanted]     Patient developed itching at site a couple of days after shot.  Decided not to administer second dose    Medication list has been reviewed and updated.  Current Outpatient Medications on File Prior to Visit  Medication Sig Dispense Refill   acyclovir (ZOVIRAX) 400 MG tablet TAKE 2 TABLETS BY MOUTH TWICE A DAY AS NEEDED 50 tablet 2   B Complex-C (SUPER B COMPLEX PO) Take 1 tablet by mouth daily.     Blood Glucose Monitoring Suppl (FREESTYLE LITE) DEVI Use to check blood sugar 2 times a day. 1 each 0   canagliflozin (INVOKANA) 100 MG TABS tablet TAKE 1 TABLET BY MOUTH ONCE DAILY.  90 tablet 1   cholecalciferol (VITAMIN D) 1000 UNITS tablet Take 1,000 Units by mouth daily.     fluticasone (FLONASE) 50 MCG/ACT nasal spray USE 1 SPRAY INTO BOTH NOSTRILS AT BEDTIME 16 g 6   Insulin Lispro-aabc, 1 U Dial, 100 UNIT/ML SOPN INJECT 6-10 UNITS INTO THE SKIN DAILY BEFORE SUPPER. 15 mL 3   Insulin Pen Needle 32G X 4 MM MISC USE AS DIRECTED ONCE DAILY. 100 each 3   Lancets (FREESTYLE) lancets Use to check blood sugar 2 times a day. 200 each 12   mesalamine (CANASA) 1000 MG suppository Place 1 suppository (1,000 mg total) rectally at bedtime. 30 suppository 3   metFORMIN (GLUCOPHAGE) 1000 MG tablet TAKE 1 TABLET BY MOUTH 2 TIMES DAILY WITH A MEAL. 180 tablet 3   Multiple Vitamins-Minerals (DIASENSE MULTIVITAMIN) TABS Take 1 tablet by mouth daily.     norethindrone-ethinyl estradiol 1/35 (ALAYCEN 1/35) tablet Take 1 tablet by mouth daily. 84 tablet 3   Omeprazole (PRILOSEC PO) Take by mouth as needed.      OZEMPIC, 1 MG/DOSE, 2 MG/1.5ML SOPN INJECT 1 MG INTO THE SKIN ONCE A WEEK. 9 mL 5   simvastatin (ZOCOR) 20 MG tablet TAKE 1 TABLET (20 MG TOTAL) BY MOUTH AT BEDTIME. 90 tablet 3   vitamin B-12 (CYANOCOBALAMIN) 1000 MCG tablet Take 1,000 mcg by mouth daily.      vitamin C (ASCORBIC ACID) 500 MG tablet Take 500 mg by mouth daily.     Semaglutide, 1 MG/DOSE, 4 MG/3ML SOPN INJECT 1 MG INTO THE SKIN ONCE A WEEK. (Patient not taking: Reported on 12/29/2020) 9 mL 5   [DISCONTINUED] glipiZIDE (GLUCOTROL) 5 MG tablet Take 1 tablet (5 mg total) by mouth daily. 90 tablet 3   No current facility-administered medications on file prior to visit.    Review of Systems:  As per HPI- otherwise negative.   Physical Examination: Vitals:   12/29/20 1435  BP: 108/70  Pulse: (!) 102  Resp: 16  Temp: 98.6 F (37 C)  SpO2: 96%   Vitals:   12/29/20 1435  Weight: 143 lb 12.8 oz (65.2 kg)  Height: 5\' 7"  (1.702 m)   Body mass index is 22.52 kg/m. Ideal Body Weight: Weight in (lb) to have BMI  = 25: 159.3  GEN: no acute distress.  Normal weight, looks well  HEENT: Atraumatic, Normocephalic.  Bilateral TM wnl, oropharynx normal.  PEERL,EOMI.   Ears and Nose: No external deformity. CV: RRR, No M/G/R. No JVD. No thrill. No extra heart sounds. PULM: CTA B, no wheezes,  crackles, rhonchi. No retractions. No resp. distress. No accessory muscle use. ABD: S, NT, ND, +BS. No rebound. No HSM. EXTR: No c/c/e PSYCH: Normally interactive. Conversant.  She noted a white patch of thick nail both great toes, and the nail is separating from the bed She has on nail polish today so I cannot see this nail change   Assessment and Plan: Physical exam  Mixed hyperlipidemia - Plan: simvastatin (ZOCOR) 20 MG tablet  Type 2 diabetes mellitus without complication, without long-term current use of insulin ([place]) - Plan: Hemoglobin A1c, CBC  Onychomycosis of great toe - Plan: terbinafine (LAMISIL) 250 MG tablet Physical exam today Encouraged a healthy diet and exercise routine Will plan further follow- up pending labs. Will treat with terbinafine for 12 weeks for onychomycosis Will plan further follow- up pending labs.   This visit occurred during the SARS-CoV-2 public health emergency.  Safety protocols were in place, including screening questions prior to the visit, additional usage of staff PPE, and extensive cleaning of exam room while observing appropriate contact time as indicated for disinfecting solutions.   Signed Lamar Blinks, MD  Addendum 6/23, received her labs as below.  Message to patient  Results for orders placed or performed in visit on 12/29/20  Hemoglobin A1c  Result Value Ref Range   Hgb A1c MFr Bld 8.9 (H) 4.6 - 6.5 %  CBC  Result Value Ref Range   WBC 8.1 4.0 - 10.5 K/uL   RBC 4.28 3.87 - 5.11 Mil/uL   Platelets 246.0 150.0 - 400.0 K/uL   Hemoglobin 13.1 12.0 - 15.0 g/dL   HCT 39.0 36.0 - 46.0 %   MCV 91.1 78.0 - 100.0 fl   MCHC 33.6 30.0 - 36.0 g/dL   RDW 13.3  11.5 - 15.5 %

## 2020-12-28 ENCOUNTER — Other Ambulatory Visit (HOSPITAL_COMMUNITY): Payer: Self-pay

## 2020-12-28 MED FILL — Canagliflozin Tab 100 MG: ORAL | 30 days supply | Qty: 30 | Fill #2 | Status: AC

## 2020-12-29 ENCOUNTER — Encounter: Payer: Self-pay | Admitting: Family Medicine

## 2020-12-29 ENCOUNTER — Ambulatory Visit (INDEPENDENT_AMBULATORY_CARE_PROVIDER_SITE_OTHER): Payer: 59 | Admitting: Family Medicine

## 2020-12-29 ENCOUNTER — Other Ambulatory Visit (HOSPITAL_COMMUNITY): Payer: Self-pay

## 2020-12-29 ENCOUNTER — Other Ambulatory Visit: Payer: Self-pay

## 2020-12-29 VITALS — BP 108/70 | HR 75 | Temp 98.6°F | Resp 16 | Ht 67.0 in | Wt 143.8 lb

## 2020-12-29 DIAGNOSIS — E119 Type 2 diabetes mellitus without complications: Secondary | ICD-10-CM | POA: Diagnosis not present

## 2020-12-29 DIAGNOSIS — Z0000 Encounter for general adult medical examination without abnormal findings: Secondary | ICD-10-CM | POA: Diagnosis not present

## 2020-12-29 DIAGNOSIS — E782 Mixed hyperlipidemia: Secondary | ICD-10-CM | POA: Diagnosis not present

## 2020-12-29 DIAGNOSIS — B351 Tinea unguium: Secondary | ICD-10-CM

## 2020-12-29 MED ORDER — TERBINAFINE HCL 250 MG PO TABS
250.0000 mg | ORAL_TABLET | Freq: Every day | ORAL | 0 refills | Status: DC
  Filled 2020-12-29: qty 90, 90d supply, fill #0

## 2020-12-29 MED ORDER — SIMVASTATIN 20 MG PO TABS
20.0000 mg | ORAL_TABLET | Freq: Every day | ORAL | 3 refills | Status: DC
  Filled 2020-12-29: qty 90, fill #0
  Filled 2021-01-25: qty 90, 90d supply, fill #0
  Filled 2021-04-26: qty 90, 90d supply, fill #1
  Filled 2021-07-26: qty 90, 90d supply, fill #2
  Filled 2021-10-25: qty 90, 90d supply, fill #3

## 2020-12-29 NOTE — Patient Instructions (Signed)
Good to see you again today- I will be in touch with your labs asap I am so glad your husband is back home!

## 2020-12-30 ENCOUNTER — Other Ambulatory Visit (HOSPITAL_COMMUNITY): Payer: Self-pay

## 2020-12-30 ENCOUNTER — Encounter: Payer: Self-pay | Admitting: Internal Medicine

## 2020-12-30 ENCOUNTER — Encounter: Payer: Self-pay | Admitting: Family Medicine

## 2020-12-30 LAB — HEMOGLOBIN A1C: Hgb A1c MFr Bld: 8.9 % — ABNORMAL HIGH (ref 4.6–6.5)

## 2020-12-30 LAB — CBC
HCT: 39 % (ref 36.0–46.0)
Hemoglobin: 13.1 g/dL (ref 12.0–15.0)
MCHC: 33.6 g/dL (ref 30.0–36.0)
MCV: 91.1 fl (ref 78.0–100.0)
Platelets: 246 10*3/uL (ref 150.0–400.0)
RBC: 4.28 Mil/uL (ref 3.87–5.11)
RDW: 13.3 % (ref 11.5–15.5)
WBC: 8.1 10*3/uL (ref 4.0–10.5)

## 2020-12-30 NOTE — Addendum Note (Signed)
Addended by: Lamar Blinks C on: 12/30/2020 01:03 PM   Modules accepted: Orders

## 2021-01-11 ENCOUNTER — Other Ambulatory Visit (HOSPITAL_COMMUNITY): Payer: Self-pay

## 2021-01-12 ENCOUNTER — Other Ambulatory Visit (HOSPITAL_COMMUNITY): Payer: Self-pay

## 2021-01-14 ENCOUNTER — Other Ambulatory Visit (HOSPITAL_COMMUNITY): Payer: Self-pay

## 2021-01-14 ENCOUNTER — Other Ambulatory Visit: Payer: Self-pay | Admitting: Internal Medicine

## 2021-01-14 NOTE — Telephone Encounter (Signed)
Pt called to ask Dr to please disregard this refill request. She was out of town and left her medication at home but now her neighbor will overnight it to her so she will not need the refill.   Pt also wanted Dr to be aware her PCP Dr.Copland discontinued one of her insulin medications and was not sure which one so she would like if Dr.Gherghe could take a look and see if she could get an understanding as to why she discontinued because she believes it may be a mistake.

## 2021-01-25 ENCOUNTER — Encounter: Payer: Self-pay | Admitting: Internal Medicine

## 2021-01-25 ENCOUNTER — Ambulatory Visit: Payer: 59 | Admitting: Internal Medicine

## 2021-01-25 ENCOUNTER — Other Ambulatory Visit: Payer: Self-pay

## 2021-01-25 ENCOUNTER — Other Ambulatory Visit (HOSPITAL_COMMUNITY): Payer: Self-pay

## 2021-01-25 VITALS — BP 128/80 | HR 84 | Ht 67.0 in | Wt 135.6 lb

## 2021-01-25 DIAGNOSIS — E1165 Type 2 diabetes mellitus with hyperglycemia: Secondary | ICD-10-CM | POA: Diagnosis not present

## 2021-01-25 DIAGNOSIS — E119 Type 2 diabetes mellitus without complications: Secondary | ICD-10-CM

## 2021-01-25 DIAGNOSIS — E538 Deficiency of other specified B group vitamins: Secondary | ICD-10-CM | POA: Diagnosis not present

## 2021-01-25 DIAGNOSIS — E782 Mixed hyperlipidemia: Secondary | ICD-10-CM | POA: Diagnosis not present

## 2021-01-25 MED ORDER — METFORMIN HCL 1000 MG PO TABS
2000.0000 mg | ORAL_TABLET | Freq: Every day | ORAL | 3 refills | Status: DC
  Filled 2021-01-25: qty 180, 90d supply, fill #0
  Filled 2021-04-26: qty 180, 90d supply, fill #1
  Filled 2021-07-26: qty 180, 90d supply, fill #2
  Filled 2021-10-26: qty 180, 90d supply, fill #3

## 2021-01-25 MED ORDER — CANAGLIFLOZIN 100 MG PO TABS
100.0000 mg | ORAL_TABLET | Freq: Every day | ORAL | 3 refills | Status: DC
  Filled 2021-01-25: qty 30, 30d supply, fill #0
  Filled 2021-03-01: qty 30, 30d supply, fill #1

## 2021-01-25 MED ORDER — MOUNJARO 10 MG/0.5ML ~~LOC~~ SOAJ
10.0000 mg | SUBCUTANEOUS | 11 refills | Status: DC
  Filled 2021-01-25: qty 4, 56d supply, fill #0
  Filled 2021-03-20: qty 4, 56d supply, fill #1
  Filled 2021-05-17: qty 4, 56d supply, fill #2
  Filled 2021-07-01: qty 2, 28d supply, fill #3
  Filled 2021-08-10: qty 2, 28d supply, fill #4
  Filled 2021-09-03: qty 2, 28d supply, fill #5
  Filled 2021-10-03: qty 2, 28d supply, fill #6

## 2021-01-25 MED ORDER — FREESTYLE LIBRE 3 SENSOR MISC
1.0000 | 3 refills | Status: DC
  Filled 2021-01-25: qty 6, fill #0
  Filled 2021-05-30: qty 2, 28d supply, fill #0

## 2021-01-25 MED ORDER — FREESTYLE LIBRE 2 SENSOR MISC
1.0000 | 3 refills | Status: DC
  Filled 2021-01-25: qty 2, 28d supply, fill #0
  Filled 2021-02-14 – 2021-03-24 (×2): qty 2, 28d supply, fill #1
  Filled 2021-05-02: qty 2, 28d supply, fill #2

## 2021-01-25 NOTE — Patient Instructions (Addendum)
Please continue: - Metformin 2000 mg with dinner - Invokana 100 mg daily - Lyumjev 6-10 units before dinner  Try to switch from Ozempic to Mounjaro 10 mg weekly.  If Darcel Bayley is not covered, let me know to increase Ozempic and probably start back on Lantus.  Please start the CGM.  Please return in 3 months with your sugar log.

## 2021-01-25 NOTE — Progress Notes (Signed)
Patient ID: [name] Young, female   DOB: 01-24-1971, 50 y.o.   MRN: [id number]  This visit occurred during the SARS-CoV-2 public health emergency.  Safety protocols were in place, including screening questions prior to the visit, additional usage of staff PPE, and extensive cleaning of exam room while observing appropriate contact time as indicated for disinfecting solutions.   HPI: [name] Young is a 50 y.o.-year-old female, presenting for f/u for DM2, dx in 2008 (GDM 1998), insulin-dependent, uncontrolled, without long-term complications. Last visit 4 months ago.  Interim history: Last year, she was found to have ulcerative colitis and started on mesalamine suppositories.  She reduced her alcohol intake. Before last visit, she had a lot of stress at home with her husband being very sick (MRSA pneumonia) and being hospitalized for several weeks and intubated.  Her sugars increased during this period of time and did not decrease afterwards. At today's visit, she noticed that her sugars are worse and she feels that this may be related to stopping glipizide. No increased urination, blurry vision, nausea, chest pain.  She continues to have hair loss.  DM2: Reviewed HbA1c levels: Lab Results  Component Value Date   HGBA1C 8.9 (H) 12/29/2020   HGBA1C 7.3 (A) 09/28/2020   HGBA1C 7.0 (A) 05/31/2020  Since she was dx with DM >> she lost 50 lbs and was able to come off Lantus.  She is on: - Metformin 2000 mg with dinner - Invokana 100 mg daily - Glipizide 5 mg moved before dinner - started 09/2019 >> Lyumjev 10 units before dinner - started 37/9024 - Trulicity 1.5 mg weekly -increased 10/2018 >> Ozempic 1 mg weekly 02/2019-she tolerates this well Was on Lantus before.  Pt checks her sugars 1-2 times a day: - am: 85, 108-173, 186 >> 115-120, 167 >> 108, 118-200, 214 >> 70, 150-200 - 2h after b'fast: 200 >> 205 >> 182 >> n/c - before lunch: 124-154, 160 >> n/c >> 103, 126 >> n/c >> 150s -  >2h after lunch: 223 >> n/c >> 127, 149 >> n/c - before dinner:  64 >> 149 >> 102-153 >> 130-173 >> n/c - 2h after dinner: 124 >> n/c >> 193 >> n/c >> 200s - bedtime:  92 >> n/c >> 58 >> up to 180 >> n/c - nighttime: 58 >> n/c >> 118 >> n/c Lowest sugar was 49 (alcohol) >> ...  58 (alcohol) >> 70; she has hypoglycemia awareness in the 60s Highest sugar was  200 (out of Glipizide) >> 192 >> 270s.  Glucometer: [employer] >> [employer]  Pt's meals are: - Breakfast: Eggs or oatmeal - Lunch: Salad or veggies or tuna salad with saltines or chicken tenders - Dinner: Fast food -take out - Snacks: Graham crackers  -No CKD, last BUN/creatinine:  Lab Results  Component Value Date   BUN 19 09/28/2020   CREATININE 0.71 09/28/2020   ACR normal.  Previously on lisinopril but now off. Lab Results  Component Value Date   MICRALBCREAT 1.8 09/28/2020   MICRALBCREAT 1.7 01/05/2020   MICRALBCREAT 0.8 10/22/2018   MICRALBCREAT 0.6 04/20/2017   MICRALBCREAT 0.7 03/16/2016   MICRALBCREAT 0.7 03/22/2015   MICRALBCREAT 0.7 10/27/2014   MICRALBCREAT 0.1 02/09/2014   MICRALBCREAT 0.1 01/28/2013   MICRALBCREAT 0.3 06/07/2011   -+ HL; last set of lipids: Lab Results  Component Value Date   CHOL 165 09/28/2020   HDL 65.70 09/28/2020   LDLCALC 77 09/28/2020   LDLDIRECT 62.0 03/22/2015   TRIG 116.0  09/28/2020   CHOLHDL 3 09/28/2020  On Zocor 20.  - last eye exam was in 01/2020: reportedly [facility].   -She has numbness and tingling in her feet  Vit B12 def. -She continues on B complex and 1000 mcg B12 daily  Review vitamin B12 levels: Lab Results  Component Value Date   VITAMINB12 963 (H) 09/28/2020   VITAMINB12 490 01/05/2020   VITAMINB12 1,216 (H) 02/28/2019   VITAMINB12 890 10/22/2018   VITAMINB12 909 12/05/2017   VITAMINB12 782 01/24/2017   VITAMINB12 969 (H) 03/16/2016   VITAMINB12 503 09/13/2015   VITAMINB12 923 (H) 03/12/2015   VITAMINB12 220 12/10/2014   No results  found for: VD25OH   She continues on vitamin D 1000 units daily.  ROS: Constitutional: no weight gain/weight loss, no fatigue, no subjective hyperthermia, no subjective hypothermia Eyes: no blurry vision, no xerophthalmia ENT: no sore throat, no nodules palpated in neck, no dysphagia, no odynophagia, no hoarseness Cardiovascular: no CP/no SOB/no palpitations/no leg swelling Respiratory: no cough/no SOB/no wheezing Gastrointestinal: no N/no V/no D/no C/no acid reflux Musculoskeletal: no muscle aches/no joint aches Skin: no rashes, no hair loss Neurological: no tremors/+ numbness/+ tingling/no dizziness  I reviewed pt's medications, allergies, PMH, social hx, family hx, and changes were documented in the history of present illness. Otherwise, unchanged from my initial visit note.  Past Medical History:  Diagnosis Date   Diabetes mellitus    Type II   Family history of breast cancer    Family history of breast cancer    No past surgical history on file. 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     Blood Glucose Monitoring Suppl (FREESTYLE LITE) DEVI Use to check blood sugar 2 times a day. 1 each 0   canagliflozin (INVOKANA) 100 MG TABS tablet TAKE 1 TABLET BY MOUTH ONCE DAILY. 90 tablet 1   cholecalciferol (VITAMIN D) 1000 UNITS tablet Take 1,000 Units by mouth daily.     fluticasone (FLONASE) 50 MCG/ACT nasal spray USE 1 SPRAY INTO BOTH  NOSTRILS AT BEDTIME 16 g 6   Insulin Lispro-aabc, 1 U Dial, 100 UNIT/ML SOPN INJECT 6-10 UNITS INTO THE SKIN DAILY BEFORE SUPPER. 15 mL 3   Insulin Pen Needle 32G X 4 MM MISC USE AS DIRECTED ONCE DAILY. 100 each 3   Lancets (FREESTYLE) lancets Use to check blood sugar 2 times a day. 200 each 12   mesalamine (CANASA) 1000 MG suppository Place 1 suppository (1,000 mg total) rectally at bedtime. 30 suppository 3   metFORMIN (GLUCOPHAGE) 1000 MG tablet TAKE 1 TABLET BY MOUTH 2 TIMES DAILY WITH A MEAL. 180 tablet 3   Multiple Vitamins-Minerals (DIASENSE MULTIVITAMIN) TABS Take 1 tablet by mouth daily.     norethindrone-ethinyl estradiol 1/35 (ALAYCEN 1/35) tablet Take 1 tablet by mouth daily. [place]  tablet 3   Omeprazole (PRILOSEC PO) Take by mouth as needed.      OZEMPIC, 1 MG/DOSE, 2 MG/1.5ML SOPN INJECT 1 MG INTO THE SKIN ONCE A WEEK. 9 mL 5   simvastatin (ZOCOR) 20 MG tablet Take 1 tablet (20 mg total) by mouth at bedtime. 90 tablet 3   terbinafine (LAMISIL) 250 MG tablet Take 1 tablet (250 mg total) by mouth daily. 90 tablet 0   vitamin B-12 (CYANOCOBALAMIN) 1000 MCG tablet Take 1,000 mcg by mouth daily.      vitamin C (ASCORBIC ACID) 500 MG tablet Take 500 mg by mouth daily.     [DISCONTINUED] glipiZIDE (GLUCOTROL) 5 MG tablet Take 1 tablet (5 mg total) by mouth daily. 90 tablet 3   No current facility-administered medications on file prior to visit.   Allergies  Allergen Reactions   Penicillins     REACTION: RASH   Shingrix [Zoster Vac Recomb Adjuvanted]     Patient developed itching at site a couple of days after shot.  Decided not to administer second dose   Family History  Problem Relation Age of Onset   Diabetes Other        Familly Hx First degree relative   Breast cancer Mother 14   Breast cancer Maternal Aunt 69   Brain cancer Maternal Uncle 58   Colon cancer Neg Hx    Esophageal cancer Neg Hx    Stomach cancer Neg Hx    Rectal cancer Neg Hx    PE: BP 128/80 (BP Location:  Right Arm, Patient Position: Sitting, Cuff Size: Normal)   Pulse 84   Ht 5\' 7"  (1.702 m)   Wt 135 lb 9.6 oz (61.5 kg)   SpO2 98%   BMI 21.24 kg/m  Body mass index is 21.24 kg/m.   Wt Readings from Last 3 Encounters:  01/25/21 135 lb 9.6 oz (61.5 kg)  12/29/20 143 lb 12.8 oz (65.2 kg)  09/28/20 141 lb 9.6 oz (64.2 kg)   Constitutional: normal weight, in NAD Eyes: PERRLA, EOMI, no exophthalmos ENT: moist mucous membranes, no thyromegaly, no cervical lymphadenopathy Cardiovascular: Tachycardia, RR, No MRG Respiratory: CTA B Gastrointestinal: abdomen soft, NT, ND, BS+ Musculoskeletal: no deformities, strength intact in all 4 Skin: moist, warm, no rashes Neurological: no tremor with outstretched hands, DTR normal in all 4  ASSESSMENT: 1. DM2, insulin-dependent, uncontrolled, without long term complications, but with hyperglycemia  2. B12 deficiency  3. HL  PLAN:  1. Patient with longstanding, poorly controlled diabetes, on a complex medication regimen with metformin, SGLT2 inhibitor, weekly GLP-1 receptor agonist and mealtime insulin added before dinner at last visit.  At that time, sugars were above target and her largest meal was at night.  We stopped her glipizide before dinner and recommended to start Lyumjev 6 to 10 units before this meal.  HbA1c at that time was 7.3%, higher.  However, last month she had another HbA1c which was even higher, at 8.9%. -At today's visit sugars are worse, despite starting Lyumjev before dinner.  Therefore, we discussed about switching from Ozempic to [facility], which is stronger and may allow [place] to continue without basal-bolus insulin.  However, if this is not covered, will need to increase Ozempic and probably stay with higher doses of Lyumjev before dinner and may be also before other meals. -I also again strongly suggested a CGM.  She agrees to start the freestyle libre.  I tried to send the [name] 3 to her pharmacy but they do not  have it yet so I  will go ahead and send the [place] 2 system. - I suggested to:  Patient Instructions  Please continue: - Metformin 2000 mg with dinner - Invokana 100 mg daily - Lyumjev 6-10 units before dinner  Try to switch from Ozempic to Mounjaro 10 mg weekly.  If Darcel Bayley is not covered, let me know to increase Ozempic and probably start back on Lantus.  Please start the CGM.  Please return in 3 months with your sugar log.  - advised to check sugars at different times of the day - 2-3x a day, rotating check times - advised for yearly eye exams >> she is UTD - return to clinic in 3 months  2. B12 deficiency -Latest B12 level was only slightly above target so we did not change her supplement dose: Lab Results  Component Value Date   VITAMINB12 963 (H) 09/28/2020  -She continues on B12 1000 mcg daily + a B complex  3.  HL -Reviewed lipids from last visit: All fractions at goal: Lab Results  Component Value Date   CHOL 165 09/28/2020   HDL 65.70 09/28/2020   LDLCALC 77 09/28/2020   LDLDIRECT 62.0 03/22/2015   TRIG 116.0 09/28/2020   CHOLHDL 3 09/28/2020  -Continue Zocor 20 mg daily without side effects  Philemon Kingdom, MD PhD [facility] Endocrinology

## 2021-01-26 ENCOUNTER — Other Ambulatory Visit (HOSPITAL_COMMUNITY): Payer: Self-pay

## 2021-01-27 ENCOUNTER — Other Ambulatory Visit (HOSPITAL_COMMUNITY): Payer: Self-pay

## 2021-01-28 ENCOUNTER — Other Ambulatory Visit (HOSPITAL_COMMUNITY): Payer: Self-pay

## 2021-01-28 MED ORDER — EMPAGLIFLOZIN 25 MG PO TABS
25.0000 mg | ORAL_TABLET | Freq: Every day | ORAL | 3 refills | Status: DC
  Filled 2021-01-28: qty 90, 90d supply, fill #0

## 2021-01-29 DIAGNOSIS — E119 Type 2 diabetes mellitus without complications: Secondary | ICD-10-CM | POA: Diagnosis not present

## 2021-02-10 ENCOUNTER — Other Ambulatory Visit (HOSPITAL_COMMUNITY): Payer: Self-pay

## 2021-02-14 ENCOUNTER — Other Ambulatory Visit (HOSPITAL_COMMUNITY): Payer: Self-pay

## 2021-03-01 ENCOUNTER — Other Ambulatory Visit (HOSPITAL_COMMUNITY): Payer: Self-pay

## 2021-03-04 ENCOUNTER — Other Ambulatory Visit: Payer: Self-pay | Admitting: Internal Medicine

## 2021-03-04 ENCOUNTER — Other Ambulatory Visit (HOSPITAL_COMMUNITY): Payer: Self-pay

## 2021-03-04 MED ORDER — EMPAGLIFLOZIN 25 MG PO TABS
25.0000 mg | ORAL_TABLET | Freq: Every day | ORAL | 3 refills | Status: DC
  Filled 2021-03-04: qty 90, 90d supply, fill #0
  Filled 2021-06-30: qty 90, 90d supply, fill #1
  Filled 2021-09-22: qty 90, 90d supply, fill #2
  Filled 2021-12-26: qty 90, 90d supply, fill #3

## 2021-03-21 ENCOUNTER — Other Ambulatory Visit (HOSPITAL_COMMUNITY): Payer: Self-pay

## 2021-03-24 ENCOUNTER — Other Ambulatory Visit (HOSPITAL_COMMUNITY): Payer: Self-pay

## 2021-04-18 ENCOUNTER — Encounter: Payer: Self-pay | Admitting: Family Medicine

## 2021-04-18 DIAGNOSIS — R239 Unspecified skin changes: Secondary | ICD-10-CM

## 2021-04-25 ENCOUNTER — Other Ambulatory Visit (HOSPITAL_COMMUNITY): Payer: Self-pay

## 2021-04-25 ENCOUNTER — Other Ambulatory Visit: Payer: Self-pay | Admitting: Family Medicine

## 2021-04-25 DIAGNOSIS — B009 Herpesviral infection, unspecified: Secondary | ICD-10-CM

## 2021-04-25 MED ORDER — ACYCLOVIR 400 MG PO TABS
800.0000 mg | ORAL_TABLET | Freq: Two times a day (BID) | ORAL | 2 refills | Status: DC | PRN
  Filled 2021-04-25: qty 50, 13d supply, fill #0
  Filled 2021-05-17: qty 50, 13d supply, fill #1
  Filled 2021-08-15: qty 50, 13d supply, fill #2

## 2021-04-26 ENCOUNTER — Telehealth: Payer: Self-pay | Admitting: Dermatology

## 2021-04-26 ENCOUNTER — Other Ambulatory Visit (HOSPITAL_COMMUNITY): Payer: Self-pay

## 2021-04-26 MED FILL — Insulin Lispro-aabc Soln Pen-inj 100 Unit/ML (1 Unit Dial): SUBCUTANEOUS | 90 days supply | Qty: 9 | Fill #1 | Status: AC

## 2021-04-26 NOTE — Telephone Encounter (Signed)
Please contact referrer, Dr. Edilia Bo, + see if they can get her in sooner than March or April

## 2021-04-26 NOTE — Telephone Encounter (Signed)
Notes documented and referral routed back to referring office. 

## 2021-04-27 ENCOUNTER — Encounter: Payer: Self-pay | Admitting: Family Medicine

## 2021-04-27 ENCOUNTER — Other Ambulatory Visit (HOSPITAL_COMMUNITY): Payer: Self-pay

## 2021-04-29 ENCOUNTER — Ambulatory Visit: Payer: 59 | Admitting: Internal Medicine

## 2021-05-02 ENCOUNTER — Other Ambulatory Visit (HOSPITAL_COMMUNITY): Payer: Self-pay

## 2021-05-03 ENCOUNTER — Other Ambulatory Visit: Payer: Self-pay

## 2021-05-03 ENCOUNTER — Encounter: Payer: Self-pay | Admitting: Internal Medicine

## 2021-05-03 ENCOUNTER — Ambulatory Visit (INDEPENDENT_AMBULATORY_CARE_PROVIDER_SITE_OTHER): Payer: 59 | Admitting: Internal Medicine

## 2021-05-03 VITALS — BP 118/76 | HR 97 | Ht 67.0 in | Wt 135.4 lb

## 2021-05-03 DIAGNOSIS — E1165 Type 2 diabetes mellitus with hyperglycemia: Secondary | ICD-10-CM

## 2021-05-03 DIAGNOSIS — E538 Deficiency of other specified B group vitamins: Secondary | ICD-10-CM

## 2021-05-03 DIAGNOSIS — E782 Mixed hyperlipidemia: Secondary | ICD-10-CM

## 2021-05-03 LAB — POCT GLYCOSYLATED HEMOGLOBIN (HGB A1C): Hemoglobin A1C: 6.9 % — AB (ref 4.0–5.6)

## 2021-05-03 NOTE — Patient Instructions (Addendum)
Please continue: - Metformin 2000 mg with dinner - Jardiance 25 mg daily - Mounjaro 10 mg weekly - Lyumjev 6-10 units before dinner (may need 4-6 units dose before lunch)  Please return in 3-4 months.

## 2021-05-03 NOTE — Progress Notes (Signed)
Patient ID: [name] Young, female   DOB: 1971/06/27, 50 y.o.   MRN: [id number]  This visit occurred during the SARS-CoV-2 public health emergency.  Safety protocols were in place, including screening questions prior to the visit, additional usage of staff PPE, and extensive cleaning of exam room while observing appropriate contact time as indicated for disinfecting solutions.   HPI: [name] Young is a 50 y.o.-year-old female, presenting for f/u for DM2, dx in 2008 (GDM 1998), insulin-dependent, uncontrolled, without long-term complications. Last visit 4 months ago.  Interim history: No increased urination, blurry vision, nausea, chest pain.  DM2: Reviewed HbA1c levels: Lab Results  Component Value Date   HGBA1C 8.9 (H) 12/29/2020   HGBA1C 7.3 (A) 09/28/2020   HGBA1C 7.0 (A) 05/31/2020  Since she was dx with DM >> she lost 50 lbs and was able to come off Lantus.  She is on: - Metformin 2000 mg with dinner - Invokana 100 >> Jardiance 25 mg daily - Glipizide 5 mg moved before dinner - started 09/2019 >> Lyumjev 10 units before dinner - started 95/0932 - Trulicity 1.5 mg weekly -increased 10/2018 >> Ozempic 1 mg weekly 02/2019 >> Mounjaro 10 mg weekly Was on Lantus before.  Pt checks her sugars >4 times a day:   Prev.: - am: 85, 108-173, 186 >> 115-120, 167 >> 108, 118-200, 214 >> 70, 150-200 - 2h after b'fast: 200 >> 205 >> 182 >> n/c - before lunch: 124-154, 160 >> n/c >> 103, 126 >> n/c >> 150s - >2h after lunch: 223 >> n/c >> 127, 149 >> n/c - before dinner:  64 >> 149 >> 102-153 >> 130-173 >> n/c - 2h after dinner: 124 >> n/c >> 193 >> n/c >> 200s - bedtime:  92 >> n/c >> 58 >> up to 180 >> n/c - nighttime: 58 >> n/c >> 118 >> n/c Lowest sugar was 49 (alcohol) >> ...  58 (alcohol) >> 70 >> 60s; she has hypoglycemia awareness in the 60s Highest sugar was  200 (out of Glipizide) >> 192 >> 270s >> 200s.  Glucometer: [employer] >> [employer]  Pt's meals are: - Breakfast:  Eggs or oatmeal >> bacon and eggs - Lunch: Salad or veggies or tuna salad with saltines or chicken tenders - Dinner: Fast food -take out - Snacks: Graham crackers  -No CKD, last BUN/creatinine:  Lab Results  Component Value Date   BUN 19 09/28/2020   CREATININE 0.71 09/28/2020   ACR normal.  Previously on lisinopril but now off. Lab Results  Component Value Date   MICRALBCREAT 1.8 09/28/2020   MICRALBCREAT 1.7 01/05/2020   MICRALBCREAT 0.8 10/22/2018   MICRALBCREAT 0.6 04/20/2017   MICRALBCREAT 0.7 03/16/2016   MICRALBCREAT 0.7 03/22/2015   MICRALBCREAT 0.7 10/27/2014   MICRALBCREAT 0.1 02/09/2014   MICRALBCREAT 0.1 01/28/2013   MICRALBCREAT 0.3 06/07/2011   -+ HL; last set of lipids: Lab Results  Component Value Date   CHOL 165 09/28/2020   HDL 65.70 09/28/2020   LDLCALC 77 09/28/2020   LDLDIRECT 62.0 03/22/2015   TRIG 116.0 09/28/2020   CHOLHDL 3 09/28/2020  On Zocor 20.  - last eye exam was in 01/2021: reportedly No [facility].   -She has numbness and tingling in her feet  Vit B12 def. -She continues on B complex and 1000 mcg B12 daily  Review vitamin B12 levels: Lab Results  Component Value Date   VITAMINB12 963 (H) 09/28/2020   VITAMINB12 490 01/05/2020   VITAMINB12  1,216 (H) 02/28/2019   VITAMINB12 890 10/22/2018   VITAMINB12 909 12/05/2017   VITAMINB12 782 01/24/2017   VITAMINB12 969 (H) 03/16/2016   VITAMINB12 503 09/13/2015   VITAMINB12 923 (H) 03/12/2015   VITAMINB12 220 12/10/2014   No results found for: VD25OH   She continues on vitamin D 1000 units daily.  ROS: Constitutional: no weight gain/weight loss, no fatigue, no subjective hyperthermia, no subjective hypothermia Eyes: no blurry vision, no xerophthalmia ENT: no sore throat, no nodules palpated in neck, no dysphagia, no odynophagia, no hoarseness Cardiovascular: no CP/no SOB/no palpitations/no leg swelling Respiratory: no cough/no SOB/no wheezing Gastrointestinal: no N/no V/no  D/no C/no acid reflux Musculoskeletal: no muscle aches/no joint aches Skin: no rashes, no hair loss Neurological: no tremors/+ numbness/+ tingling/no dizziness  I reviewed pt's medications, allergies, PMH, social hx, family hx, and changes were documented in the history of present illness. Otherwise, unchanged from my initial visit note.  Past Medical History:  Diagnosis Date   Diabetes mellitus    Type II   Family history of breast cancer    Family history of breast cancer    No past surgical history on file. 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 50 tablet 2   B Complex-C (SUPER B COMPLEX PO) Take 1 tablet by mouth daily.     Blood Glucose Monitoring Suppl (FREESTYLE LITE) DEVI Use to check blood sugar 2 times a day. 1 each 0   cholecalciferol (VITAMIN D) 1000 UNITS tablet Take 1,000 Units by mouth daily.     Continuous Blood Gluc Sensor (FREESTYLE LIBRE 2 SENSOR) MISC Change sensor every 14 days 6 each 3   Continuous Blood Gluc Sensor (FREESTYLE LIBRE 3 SENSOR) MISC Use as directed every 14 days 6 each 3   empagliflozin (JARDIANCE) 25 MG TABS tablet Take 1 tablet (25 mg total) by mouth daily. 90 tablet 3   fluticasone (FLONASE) 50 MCG/ACT nasal spray USE 1 SPRAY INTO BOTH NOSTRILS AT BEDTIME 16 g 6   Insulin Lispro-aabc (LYUMJEV) 100 UNIT/ML KwikPen INJECT 6-10 UNITS INTO THE SKIN DAILY BEFORE SUPPER. 15 mL 3   Insulin Pen Needle 32G X 4 MM MISC USE AS DIRECTED ONCE DAILY. 100 each 3   Lancets  (FREESTYLE) lancets Use to check blood sugar 2 times a day. 200 each 12   mesalamine (CANASA) 1000 MG suppository Place 1 suppository (1,000 mg total) rectally at bedtime. 30 suppository 3   metFORMIN (GLUCOPHAGE) 1000 MG tablet Take 2 tablets (2,000 mg total) by mouth daily with supper. 180 tablet 3   Multiple Vitamins-Minerals (DIASENSE MULTIVITAMIN) TABS Take 1 tablet by mouth daily.     norethindrone-ethinyl estradiol 1/35 (ALAYCEN 1/35) tablet Take 1 tablet by mouth daily. 84 tablet 3   Omeprazole (PRILOSEC PO) Take by mouth as needed.      OZEMPIC, 1 MG/DOSE, 2 MG/1.5ML SOPN INJECT 1 MG INTO THE SKIN ONCE  A WEEK. 9 mL 5   simvastatin (ZOCOR) 20 MG tablet Take 1 tablet (20 mg total) by mouth at bedtime. 90 tablet 3   terbinafine (LAMISIL) 250 MG tablet Take 1 tablet (250 mg total) by mouth daily. 90 tablet 0   tirzepatide (MOUNJARO) 10 MG/0.5ML Pen Inject 10 mg into the skin once a week. 4 mL 11   vitamin B-12 (CYANOCOBALAMIN) 1000 MCG tablet Take 1,000 mcg by mouth daily.      vitamin C (ASCORBIC ACID) 500 MG tablet Take 500 mg by mouth daily.     [DISCONTINUED] glipiZIDE (GLUCOTROL) 5 MG tablet Take 1 tablet (5 mg total) by mouth daily. 90 tablet 3   No current facility-administered medications on file prior to visit.   Allergies  Allergen Reactions   Penicillins     REACTION: RASH   Shingrix [Zoster Vac Recomb Adjuvanted]     Patient developed itching at site a couple of days after shot.  Decided not to administer second dose   Family History  Problem Relation Age of Onset   Diabetes Other        Familly Hx First degree relative   Breast cancer Mother 25   Breast cancer Maternal Aunt 73   Brain cancer Maternal Uncle 58   Colon cancer Neg Hx    Esophageal cancer Neg Hx    Stomach cancer Neg Hx    Rectal cancer Neg Hx    PE: BP 118/76 (BP Location: Left Arm, Patient Position: Sitting, Cuff Size: Normal)   Pulse 97   Ht 5\' 7"  (1.702 m)   Wt 135 lb 6.4 oz (61.4 kg)   SpO2  96%   BMI 21.21 kg/m  Body mass index is 21.21 kg/m.   Wt Readings from Last 3 Encounters:  05/03/21 135 lb 6.4 oz (61.4 kg)  01/25/21 135 lb 9.6 oz (61.5 kg)  12/29/20 143 lb 12.8 oz (65.2 kg)   Constitutional: normal weight, in NAD Eyes: PERRLA, EOMI, no exophthalmos ENT: moist mucous membranes, no thyromegaly, no cervical lymphadenopathy Cardiovascular: Tachycardia, RR, No MRG Respiratory: CTA B Gastrointestinal: abdomen soft, NT, ND, BS+ Musculoskeletal: no deformities, strength intact in all 4 Skin: moist, warm, no rashes Neurological: no tremor with outstretched hands, DTR normal in all 4  ASSESSMENT: 1. DM2, insulin-dependent, uncontrolled, without long term complications, but with hyperglycemia  2. B12 deficiency  3. HL  PLAN:  1. Patient with longstanding, poorly controlled type 2 diabetes, on a complex medication regimen with metformin, SGLT2 inhibitor, also rapid acting insulin, and GLP-1 receptor agonist, changed from Ozempic to [place] at last visit.  At that time, sugars were worse, despite adding Lyumjev before dinner.  At that time, I advised her to start [facility] and I strongly suggested a CGM.  I sent a prescription for the [place] 2 system to her pharmacy.  HbA1c at time was high, at 8.9%. CGM interpretation: -At today's visit, we reviewed her CGM downloads: It appears that 80% of values are in target range (goal >70%), while 20% are higher than 180 (goal <25%), and 0% are lower than 70 (goal <4%).  The calculated average blood sugar is 150.  The projected HbA1c for the next 3 months (GMI) is 6.9%. -Reviewing the CGM trends, it appears that her sugars did improve from last visit but they are still slightly higher than target after lunch and especially after dinner and they are decreasing after dinner.  She is occasionally alerted at night because of low blood sugars.  However, she is not symptomatic during these episodes.  We discussed that the fluctuation in blood  sugars after dinner is most likely due to taking Lyumjev too late after her sugars are already high post dinner.  I strongly advised her to move Lyumjev before the meal to improve the postprandial hyperglycemia and subsequent overnight drop in blood sugars.  Due to this drop, her sugars are higher in the morning, most likely as a compensatory response.  I am hoping that taking Lyumjev is recommended that dinnertime will help her also have better blood sugars in the morning.  In this case, her blood sugars after lunch may also improved.  I advised her that if not, she may need to take several units of Lyumjev before this meal, also. -She tolerates Mounjaro well so we will continue the same dose. -Discussed about reducing the amount of fat in her diet, which increases her insulin resistance. -We also discussed about the possibility of using an insulin pump or a mechanical Cequr pump in the future - I suggested to:  Patient Instructions  Please continue: - Metformin 2000 mg with dinner - Jardiance 25 mg daily - Mounjaro 10 mg weekly - Lyumjev 6-10 units before dinner (may need 4-6 units dose before lunch)  Please return in 3-4 months.  - we checked her HbA1c: 6.9% (much better) - advised to check sugars at different times of the day - 4x a day, rotating check times - advised for yearly eye exams >> she is UTD - return to clinic in 3-4 months  2. B12 deficiency -Latest B12 level was only slightly above target so we continued the same supplement dose: Lab Results  Component Value Date   VITAMINB12 963 (H) 09/28/2020  -She continues on B12 1000 mcg daily and the B complex  3.  HL -Reviewed latest lipid panel from 09/2020: Fractions at goal Lab Results  Component Value Date   CHOL 165 09/28/2020   HDL 65.70 09/28/2020   LDLCALC 77 09/28/2020   LDLDIRECT 62.0 03/22/2015   TRIG 116.0 09/28/2020   CHOLHDL 3 09/28/2020  -Continue Zocor 20 mg daily without side effects  Philemon Kingdom, MD  PhD [facility] Endocrinology

## 2021-05-17 ENCOUNTER — Other Ambulatory Visit (HOSPITAL_COMMUNITY): Payer: Self-pay

## 2021-05-30 ENCOUNTER — Encounter: Payer: Self-pay | Admitting: Internal Medicine

## 2021-05-30 ENCOUNTER — Other Ambulatory Visit (HOSPITAL_COMMUNITY): Payer: Self-pay

## 2021-05-30 ENCOUNTER — Other Ambulatory Visit: Payer: Self-pay | Admitting: Internal Medicine

## 2021-05-30 ENCOUNTER — Other Ambulatory Visit: Payer: Self-pay

## 2021-05-30 DIAGNOSIS — E1165 Type 2 diabetes mellitus with hyperglycemia: Secondary | ICD-10-CM

## 2021-05-30 MED ORDER — DEXCOM G6 RECEIVER DEVI
0 refills | Status: DC
  Filled 2021-05-30: qty 1, fill #0
  Filled 2022-05-15: qty 1, 28d supply, fill #0

## 2021-05-30 MED ORDER — DEXCOM G6 SENSOR MISC
3 refills | Status: DC
  Filled 2021-05-30: qty 9, fill #0
  Filled 2021-06-01: qty 9, 84d supply, fill #0
  Filled 2021-06-06: qty 3, 30d supply, fill #0
  Filled 2021-07-05: qty 3, 30d supply, fill #1
  Filled 2021-08-06: qty 3, 30d supply, fill #2
  Filled 2021-09-01 – 2021-09-05 (×3): qty 3, 30d supply, fill #3
  Filled 2021-09-26 – 2021-10-03 (×2): qty 3, 30d supply, fill #4
  Filled 2021-11-03: qty 3, 30d supply, fill #5
  Filled 2021-11-29: qty 3, 30d supply, fill #6
  Filled 2021-12-26: qty 3, 30d supply, fill #7
  Filled 2022-01-18: qty 9, 90d supply, fill #8
  Filled 2022-04-12: qty 3, 30d supply, fill #9

## 2021-05-30 MED ORDER — DEXCOM G6 TRANSMITTER MISC
3 refills | Status: DC
  Filled 2021-05-30 – 2021-06-06 (×2): qty 1, 90d supply, fill #0
  Filled 2021-09-01 – 2021-09-05 (×2): qty 1, 90d supply, fill #1
  Filled 2021-12-26: qty 1, 90d supply, fill #2
  Filled 2022-03-28: qty 1, 90d supply, fill #3

## 2021-05-30 MED ORDER — FREESTYLE LIBRE 3 SENSOR MISC
1.0000 | 3 refills | Status: DC
  Filled 2021-05-30: qty 6, 90d supply, fill #0

## 2021-05-31 ENCOUNTER — Other Ambulatory Visit (HOSPITAL_COMMUNITY): Payer: Self-pay

## 2021-05-31 MED ORDER — FREESTYLE LIBRE 3 SENSOR MISC
1.0000 | 3 refills | Status: DC
  Filled 2021-05-31: qty 6, fill #0

## 2021-05-31 NOTE — Addendum Note (Signed)
Addended by: Lauralyn Primes on: 05/31/2021 04:47 PM   Modules accepted: Orders

## 2021-06-01 ENCOUNTER — Other Ambulatory Visit (HOSPITAL_COMMUNITY): Payer: Self-pay

## 2021-06-01 ENCOUNTER — Telehealth: Payer: Self-pay

## 2021-06-01 NOTE — Telephone Encounter (Signed)
Patient Advocate Encounter  Prior Authorization for [employer] has been approved.    PA# [id number]  Effective dates: 06/01/21 through 05/31/22  Per Test Claim Patients co-pay is $90.   Spoke with Pharmacy to Process.  Patient Advocate Fax:  [phone]

## 2021-06-03 ENCOUNTER — Other Ambulatory Visit (HOSPITAL_COMMUNITY): Payer: Self-pay

## 2021-06-06 ENCOUNTER — Other Ambulatory Visit (HOSPITAL_COMMUNITY): Payer: Self-pay

## 2021-06-06 NOTE — Telephone Encounter (Signed)
Patient Advocate Encounter  Prior Authorization for Dexcom G6 Sensor has been approved.    PA# [id number]  Effective dates: 06/05/21 through 06/04/22  Per Test Claim Patients co-pay is $64.33.   Spoke with Pharmacy to Process.  Patient Advocate Fax:  [phone]

## 2021-06-13 DIAGNOSIS — D1039 Benign neoplasm of other parts of mouth: Secondary | ICD-10-CM | POA: Diagnosis not present

## 2021-06-13 DIAGNOSIS — J343 Hypertrophy of nasal turbinates: Secondary | ICD-10-CM | POA: Diagnosis not present

## 2021-06-30 ENCOUNTER — Other Ambulatory Visit (HOSPITAL_COMMUNITY): Payer: Self-pay

## 2021-07-01 ENCOUNTER — Other Ambulatory Visit (HOSPITAL_COMMUNITY): Payer: Self-pay

## 2021-07-05 ENCOUNTER — Other Ambulatory Visit (HOSPITAL_COMMUNITY): Payer: Self-pay

## 2021-07-19 DIAGNOSIS — L988 Other specified disorders of the skin and subcutaneous tissue: Secondary | ICD-10-CM | POA: Diagnosis not present

## 2021-07-19 DIAGNOSIS — D2272 Melanocytic nevi of left lower limb, including hip: Secondary | ICD-10-CM | POA: Diagnosis not present

## 2021-07-19 DIAGNOSIS — R202 Paresthesia of skin: Secondary | ICD-10-CM | POA: Diagnosis not present

## 2021-07-26 ENCOUNTER — Other Ambulatory Visit (HOSPITAL_COMMUNITY): Payer: Self-pay

## 2021-07-26 MED FILL — Insulin Lispro-aabc Soln Pen-inj 100 Unit/ML (1 Unit Dial): SUBCUTANEOUS | 90 days supply | Qty: 9 | Fill #2 | Status: AC

## 2021-08-05 ENCOUNTER — Other Ambulatory Visit (HOSPITAL_COMMUNITY): Payer: Self-pay

## 2021-08-05 ENCOUNTER — Encounter: Payer: Self-pay | Admitting: Internal Medicine

## 2021-08-05 ENCOUNTER — Ambulatory Visit: Payer: 59 | Admitting: Internal Medicine

## 2021-08-05 ENCOUNTER — Other Ambulatory Visit: Payer: Self-pay

## 2021-08-05 VITALS — BP 110/72 | HR 97 | Ht 67.0 in | Wt 138.0 lb

## 2021-08-05 DIAGNOSIS — E782 Mixed hyperlipidemia: Secondary | ICD-10-CM | POA: Diagnosis not present

## 2021-08-05 DIAGNOSIS — E538 Deficiency of other specified B group vitamins: Secondary | ICD-10-CM | POA: Diagnosis not present

## 2021-08-05 DIAGNOSIS — E1165 Type 2 diabetes mellitus with hyperglycemia: Secondary | ICD-10-CM | POA: Diagnosis not present

## 2021-08-05 LAB — POCT GLYCOSYLATED HEMOGLOBIN (HGB A1C): Hemoglobin A1C: 7.1 % — AB (ref 4.0–5.6)

## 2021-08-05 MED ORDER — INSULIN GLARGINE-YFGN 100 UNIT/ML ~~LOC~~ SOPN
8.0000 [IU] | PEN_INJECTOR | Freq: Every day | SUBCUTANEOUS | 3 refills | Status: DC
  Filled 2021-08-05: qty 9, 90d supply, fill #0
  Filled 2021-11-01: qty 9, 90d supply, fill #1
  Filled 2022-02-23: qty 9, 90d supply, fill #2

## 2021-08-05 NOTE — Patient Instructions (Addendum)
Please continue: - Metformin 2000 mg with dinner - Jardiance 25 mg daily - Mounjaro 10 mg weekly  Please add: - Lantus 8 units at bedtime - Lyumjev 6-8 units before breakfast and dinner  Please return in 3-4 months.

## 2021-08-05 NOTE — Progress Notes (Signed)
Patient ID: [name] Young, female   DOB: 12/13/1970, 51 y.o.   MRN: [id number]  This visit occurred during the SARS-CoV-2 public health emergency.  Safety protocols were in place, including screening questions prior to the visit, additional usage of staff PPE, and extensive cleaning of exam room while observing appropriate contact time as indicated for disinfecting solutions.   HPI: [name] Young is a 51 y.o.-year-old female, presenting for f/u for DM2, dx in 2008 (GDM 1998), insulin-dependent, uncontrolled, without long-term complications. Last visit 4 months ago.  Interim history: No increased urination, blurry vision, nausea, chest pain.  DM2: Reviewed HbA1c levels: Lab Results  Component Value Date   HGBA1C 6.9 (A) 05/03/2021   HGBA1C 8.9 (H) 12/29/2020   HGBA1C 7.3 (A) 09/28/2020  Since she was dx with DM >> she lost 50 lbs and was able to come off Lantus.  She is on: - Metformin 2000 mg with dinner - Invokana 100 >> Jardiance 25 mg daily - Lyumjev 10 units before dinner - started 96/2952  - Trulicity 1.5 mg weekly -increased 10/2018 >> Ozempic 1 mg weekly 02/2019 >> Mounjaro 10 mg weekly Was on Lantus before. She was on Glipizide.  Pt checks her sugars >4 times a day now with a Dexcom CGM:     Previously:    Lowest sugar was 49 (alcohol) >> ...  58 (alcohol) >> 70 >> 60s; she has hypoglycemia awareness in the 60s Highest sugar was  270s >> 200s >> 317.  Glucometer: [employer] >> [employer]  Pt's meals are: - Breakfast: Eggs or oatmeal >> bacon and eggs - Lunch: Salad or veggies or tuna salad with saltines or chicken tenders - Dinner: Fast food -take out - Snacks: Graham crackers  -No CKD, last BUN/creatinine:  Lab Results  Component Value Date   BUN 19 09/28/2020   CREATININE 0.71 09/28/2020   ACR normal.  Previously on lisinopril but now off. Lab Results  Component Value Date   MICRALBCREAT 1.8 09/28/2020   MICRALBCREAT 1.7 01/05/2020   MICRALBCREAT  0.8 10/22/2018   MICRALBCREAT 0.6 04/20/2017   MICRALBCREAT 0.7 03/16/2016   MICRALBCREAT 0.7 03/22/2015   MICRALBCREAT 0.7 10/27/2014   MICRALBCREAT 0.1 02/09/2014   MICRALBCREAT 0.1 01/28/2013   MICRALBCREAT 0.3 06/07/2011   -+ HL; last set of lipids: Lab Results  Component Value Date   CHOL 165 09/28/2020   HDL 65.70 09/28/2020   LDLCALC 77 09/28/2020   LDLDIRECT 62.0 03/22/2015   TRIG 116.0 09/28/2020   CHOLHDL 3 09/28/2020  On Zocor 20.  - last eye exam was in 01/2021: reportedly No [facility].   -She has numbness and tingling in her feet  Vit B12 def. -She continues on B complex and 1000 mcg B12 daily  Review vitamin B12 levels: Lab Results  Component Value Date   VITAMINB12 963 (H) 09/28/2020   VITAMINB12 490 01/05/2020   VITAMINB12 1,216 (H) 02/28/2019   VITAMINB12 890 10/22/2018   VITAMINB12 909 12/05/2017   VITAMINB12 782 01/24/2017   VITAMINB12 969 (H) 03/16/2016   VITAMINB12 503 09/13/2015   VITAMINB12 923 (H) 03/12/2015   VITAMINB12 220 12/10/2014   ROS: + see HPI Neurological: no tremors/+ numbness/+ tingling/no dizziness  I reviewed pt's medications, allergies, PMH, social hx, family hx, and changes were documented in the history of present illness. Otherwise, unchanged from my initial visit note.  Past Medical History:  Diagnosis Date   Diabetes mellitus    Type II   Family history of  breast cancer    Family history of breast cancer    No past surgical history on file. 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 50 tablet 2   B Complex-C (SUPER B COMPLEX PO) Take 1 tablet by mouth daily. (Patient not taking: Reported on 05/03/2021)     Blood Glucose Monitoring Suppl (FREESTYLE LITE) DEVI Use to check blood sugar 2 times a day. 1 each 0   cholecalciferol (VITAMIN D) 1000 UNITS tablet Take 1,000 Units by mouth daily.     Continuous Blood Gluc Receiver (DEXCOM G6 RECEIVER) DEVI Use as instructed to check blood sugars 1 each 0   Continuous Blood Gluc Sensor (DEXCOM G6 SENSOR) MISC Change sensor every 10 days 9 each 3   Continuous Blood Gluc Sensor (FREESTYLE LIBRE 2 SENSOR) MISC Change sensor every 14 days 6 each 3   Continuous Blood Gluc Sensor (FREESTYLE LIBRE 3 SENSOR) MISC Use as directed every 14 days 6 each 3   Continuous Blood Gluc Transmit (DEXCOM G6 TRANSMITTER) MISC Change every 3 months 1 each 3   empagliflozin (JARDIANCE) 25 MG TABS tablet Take 1 tablet (25 mg total) by mouth daily. 90 tablet 3   fluticasone (FLONASE) 50 MCG/ACT nasal spray USE 1 SPRAY INTO BOTH NOSTRILS AT BEDTIME 16 g 6   Insulin Lispro-aabc (LYUMJEV) 100 UNIT/ML KwikPen INJECT 6-10 UNITS INTO THE SKIN DAILY BEFORE SUPPER. 15 mL 3   Insulin Pen Needle 32G X 4 MM MISC USE AS DIRECTED ONCE DAILY. 100 each 3   Lancets (FREESTYLE) lancets Use to check blood sugar 2 times a day. 200 each 12   mesalamine (CANASA) 1000 MG suppository Place 1 suppository (1,000 mg total) rectally at bedtime. 30 suppository 3   metFORMIN (GLUCOPHAGE) 1000 MG tablet Take 2 tablets (2,000 mg total) by mouth daily with supper. 180 tablet 3   Multiple Vitamins-Minerals (DIASENSE MULTIVITAMIN) TABS Take 1 tablet by mouth daily.     norethindrone-ethinyl estradiol 1/35 (ALAYCEN 1/35) tablet Take 1 tablet by mouth daily. 84 tablet 3   Omeprazole (PRILOSEC PO) Take by mouth as needed.      simvastatin (ZOCOR) 20 MG tablet Take 1 tablet (20 mg total) by mouth at bedtime. 90 tablet 3   terbinafine  (LAMISIL) 250 MG tablet Take 1 tablet (250 mg total) by mouth daily. 90 tablet 0   tirzepatide (MOUNJARO) 10 MG/0.5ML Pen Inject 10 mg into the skin once a week. 4 mL 11   vitamin B-12 (CYANOCOBALAMIN) 1000 MCG tablet Take 1,000 mcg by mouth daily.      vitamin C (ASCORBIC ACID) 500 MG tablet Take 500 mg by mouth daily.     [DISCONTINUED] glipiZIDE (GLUCOTROL) 5 MG tablet Take 1 tablet (5 mg total) by mouth daily. 90 tablet 3   No current facility-administered medications on file prior to visit.   Allergies  Allergen Reactions   Penicillins  REACTION: RASH   Shingrix [Zoster Vac Recomb Adjuvanted]     Patient developed itching at site a couple of days after shot.  Decided not to administer second dose   Family History  Problem Relation Age of Onset   Diabetes Other        Familly Hx First degree relative   Breast cancer Mother 53   Breast cancer Maternal Aunt 34   Brain cancer Maternal Uncle 58   Colon cancer Neg Hx    Esophageal cancer Neg Hx    Stomach cancer Neg Hx    Rectal cancer Neg Hx    PE: BP 110/72 (BP Location: Right Arm, Patient Position: Sitting, Cuff Size: Normal)    Pulse 97    Ht 5\' 7"  (1.702 m)    Wt 138 lb (62.6 kg) Comment: Pt weighed in at 129 at home   SpO2 98%    BMI 21.61 kg/m     Wt Readings from Last 3 Encounters:  08/05/21 138 lb (62.6 kg)  05/03/21 135 lb 6.4 oz (61.4 kg)  01/25/21 135 lb 9.6 oz (61.5 kg)   Constitutional: normal weight, in NAD Eyes: PERRLA, EOMI, no exophthalmos ENT: moist mucous membranes, no thyromegaly, no cervical lymphadenopathy Cardiovascular: Tachycardia, RR, No MRG Respiratory: CTA B Musculoskeletal: no deformities, strength intact in all 4 Skin: moist, warm, no rashes Neurological: no tremor with outstretched hands, DTR normal in all 4 Diabetic Foot Exam - Simple   Simple Foot Form Diabetic Foot exam was performed with the following findings: Yes 08/05/2021  2:39 PM  Visual Inspection No deformities, no  ulcerations, no other skin breakdown bilaterally: Yes Sensation Testing Pulse Check Comments    ASSESSMENT: 1. DM2, insulin-dependent, uncontrolled, without long term complications, but with hyperglycemia  2. B12 deficiency  3. HL  PLAN:  1. Patient with longstanding uncontrolled type 2 diabetes, on a complex medication regimen including metformin, insulin inhibitor, GLP-1 receptor agonist, and also Lyumjev as needed for correction of high blood sugars.  At last visit, HbA1c was improved to 6.9%, after we switched to [facility].  She did lose a significant amount of weight on this. CGM interpretation: -At today's visit, we reviewed her CGM downloads: It appears that 57.5% of values are in target range (goal >70%), while 42.5% are higher than 180 (goal <25%), and 0% are lower than 70 (goal <4%).  The calculated average blood sugar is 177.  The projected HbA1c for the next 3 months (GMI) is 7.5%. -Reviewing the CGM trends, sugars have been higher than before, increasing usually after dinner, progressively throughout the night.  They are also higher after breakfast.  She feels that her sugars are now increasing after most of her meals and she is using Lyumjev to bring it down.  We discussed that in this case, we will most likely need to start Lyumjev before meals.  I advised her to use 6 to 8 units before breakfast. She can always still use Lyumjev for correction, also.  Since sugars are increasing overnight, it looks like she also needs long-acting insulin.  She agrees to start this.  We will use a low-dose.  She has been on Lantus before, 80 units, when she was overweight. -We again discussed that reducing the amount of fat in her diet will reduce her insulin resistance. -In the future, we can consider a mechanical pump (for example CeQur) or a regular insulin pump - I suggested to:  Patient Instructions  Please continue: - Metformin 2000 mg with  dinner - Jardiance 25 mg daily - Mounjaro 10 mg  weekly  Please add: - Lantus 8 units at bedtime - Lyumjev 6-8 units before breakfast and dinner  Please return in 3-4 months.  - we checked her HbA1c: 7.1% (higher) - advised to check sugars at different times of the day - 4x a day, rotating check times - advised for yearly eye exams >> she is UTD - Foot exam performed today - return to clinic in 3-4 months  2. B12 deficiency -Latest B12 level was only slightly above target so we continued the same supplement dose: Lab Results  Component Value Date   VITAMINB12 963 (H) 09/28/2020  -Continues on B12 1000 mcg daily and the B complex -We will recheck this at next visit  3.  HL -Reviewed latest lipid panel from 09/2020: Fractions at goal: Lab Results  Component Value Date   CHOL 165 09/28/2020   HDL 65.70 09/28/2020   LDLCALC 77 09/28/2020   LDLDIRECT 62.0 03/22/2015   TRIG 116.0 09/28/2020   CHOLHDL 3 09/28/2020  -Continues on Zocor 10 mg daily without side effects -We will recheck this at next OV  Philemon Kingdom, MD PhD [facility] Endocrinology

## 2021-08-06 ENCOUNTER — Other Ambulatory Visit (HOSPITAL_COMMUNITY): Payer: Self-pay

## 2021-08-10 ENCOUNTER — Other Ambulatory Visit (HOSPITAL_COMMUNITY): Payer: Self-pay

## 2021-08-15 ENCOUNTER — Other Ambulatory Visit (HOSPITAL_COMMUNITY): Payer: Self-pay

## 2021-08-22 ENCOUNTER — Other Ambulatory Visit (HOSPITAL_COMMUNITY): Payer: Self-pay

## 2021-08-22 MED ORDER — CARESTART COVID-19 HOME TEST VI KIT
PACK | 0 refills | Status: DC
  Filled 2021-08-22: qty 4, 4d supply, fill #0

## 2021-08-24 ENCOUNTER — Encounter: Payer: Self-pay | Admitting: Internal Medicine

## 2021-08-26 ENCOUNTER — Encounter: Payer: Self-pay | Admitting: Family Medicine

## 2021-08-29 ENCOUNTER — Other Ambulatory Visit (HOSPITAL_COMMUNITY): Payer: Self-pay

## 2021-08-29 ENCOUNTER — Encounter: Payer: Self-pay | Admitting: Internal Medicine

## 2021-08-29 DIAGNOSIS — E1165 Type 2 diabetes mellitus with hyperglycemia: Secondary | ICD-10-CM

## 2021-08-29 MED ORDER — DEXCOM G7 SENSOR MISC
3 refills | Status: DC
  Filled 2021-08-29: qty 9, 84d supply, fill #0
  Filled 2021-09-01: qty 9, 90d supply, fill #0
  Filled 2021-09-01: qty 9, 84d supply, fill #0
  Filled 2021-09-01 – 2021-09-02 (×2): qty 9, 90d supply, fill #0
  Filled 2021-09-05 – 2021-09-07 (×2): qty 3, 30d supply, fill #0
  Filled 2021-10-06 – 2021-11-01 (×3): qty 9, 90d supply, fill #0
  Filled 2021-11-01: qty 3, 28d supply, fill #0
  Filled 2021-12-26 – 2022-05-15 (×2): qty 9, 90d supply, fill #0
  Filled 2022-05-22: qty 3, 30d supply, fill #0

## 2021-08-30 ENCOUNTER — Other Ambulatory Visit (HOSPITAL_COMMUNITY): Payer: Self-pay

## 2021-09-01 ENCOUNTER — Other Ambulatory Visit (HOSPITAL_COMMUNITY): Payer: Self-pay

## 2021-09-02 ENCOUNTER — Other Ambulatory Visit (HOSPITAL_COMMUNITY): Payer: Self-pay

## 2021-09-05 ENCOUNTER — Other Ambulatory Visit: Payer: Self-pay | Admitting: Family Medicine

## 2021-09-05 ENCOUNTER — Other Ambulatory Visit (HOSPITAL_COMMUNITY): Payer: Self-pay

## 2021-09-05 DIAGNOSIS — Z1231 Encounter for screening mammogram for malignant neoplasm of breast: Secondary | ICD-10-CM

## 2021-09-07 ENCOUNTER — Other Ambulatory Visit (HOSPITAL_COMMUNITY): Payer: Self-pay

## 2021-09-09 ENCOUNTER — Ambulatory Visit
Admission: RE | Admit: 2021-09-09 | Discharge: 2021-09-09 | Disposition: A | Discharge status: Home / Self Care | Payer: 59 | Source: Ambulatory Visit | Attending: Family Medicine | Admitting: Family Medicine

## 2021-09-09 DIAGNOSIS — Z1231 Encounter for screening mammogram for malignant neoplasm of breast: Secondary | ICD-10-CM | POA: Diagnosis not present

## 2021-09-22 ENCOUNTER — Other Ambulatory Visit: Payer: Self-pay | Admitting: Internal Medicine

## 2021-09-22 ENCOUNTER — Other Ambulatory Visit (HOSPITAL_COMMUNITY): Payer: Self-pay

## 2021-09-22 MED ORDER — LYUMJEV KWIKPEN 100 UNIT/ML ~~LOC~~ SOPN
6.0000 [IU] | PEN_INJECTOR | Freq: Two times a day (BID) | SUBCUTANEOUS | 3 refills | Status: DC
  Filled 2021-09-22: qty 12, 75d supply, fill #0
  Filled 2021-10-03: qty 15, 94d supply, fill #0
  Filled 2021-10-04: qty 12, 75d supply, fill #0
  Filled 2022-02-28: qty 12, 75d supply, fill #1
  Filled 2022-05-15: qty 12, 75d supply, fill #2

## 2021-09-26 ENCOUNTER — Other Ambulatory Visit (HOSPITAL_COMMUNITY): Payer: Self-pay

## 2021-10-03 ENCOUNTER — Other Ambulatory Visit (HOSPITAL_COMMUNITY): Payer: Self-pay

## 2021-10-03 ENCOUNTER — Other Ambulatory Visit: Payer: Self-pay | Admitting: Family Medicine

## 2021-10-03 DIAGNOSIS — Z0000 Encounter for general adult medical examination without abnormal findings: Secondary | ICD-10-CM

## 2021-10-03 MED ORDER — ALYACEN 1/35 1-35 MG-MCG PO TABS
1.0000 | ORAL_TABLET | Freq: Every day | ORAL | 1 refills | Status: DC
  Filled 2021-10-03: qty 84, 84d supply, fill #0
  Filled 2022-01-04: qty 84, 84d supply, fill #1

## 2021-10-04 ENCOUNTER — Encounter: Payer: Self-pay | Admitting: Internal Medicine

## 2021-10-04 ENCOUNTER — Other Ambulatory Visit (HOSPITAL_COMMUNITY): Payer: Self-pay

## 2021-10-04 DIAGNOSIS — E1165 Type 2 diabetes mellitus with hyperglycemia: Secondary | ICD-10-CM

## 2021-10-05 ENCOUNTER — Other Ambulatory Visit (HOSPITAL_COMMUNITY): Payer: Self-pay

## 2021-10-05 MED ORDER — TIRZEPATIDE 7.5 MG/0.5ML ~~LOC~~ SOAJ
7.5000 mg | SUBCUTANEOUS | 1 refills | Status: DC
  Filled 2021-10-05: qty 2, 28d supply, fill #0

## 2021-10-06 ENCOUNTER — Other Ambulatory Visit (HOSPITAL_COMMUNITY): Payer: Self-pay

## 2021-10-06 MED ORDER — TIRZEPATIDE 12.5 MG/0.5ML ~~LOC~~ SOAJ
12.5000 mg | SUBCUTANEOUS | 0 refills | Status: DC
  Filled 2021-10-06: qty 2, 28d supply, fill #0

## 2021-10-06 NOTE — Telephone Encounter (Signed)
Contacted pharmacy and they can not double the '5mg'$  weekly dose. It was kicked back by insurance. Please advise. ?

## 2021-10-07 ENCOUNTER — Other Ambulatory Visit (HOSPITAL_COMMUNITY): Payer: Self-pay

## 2021-10-25 ENCOUNTER — Ambulatory Visit: Payer: 59 | Admitting: Family

## 2021-10-25 ENCOUNTER — Encounter: Payer: Self-pay | Admitting: Family Medicine

## 2021-10-25 ENCOUNTER — Other Ambulatory Visit (HOSPITAL_COMMUNITY): Payer: Self-pay

## 2021-10-25 ENCOUNTER — Encounter: Payer: Self-pay | Admitting: Family

## 2021-10-25 ENCOUNTER — Other Ambulatory Visit: Payer: Self-pay | Admitting: Family

## 2021-10-25 VITALS — BP 100/68 | HR 96 | Temp 98.2°F | Ht 67.0 in | Wt 134.0 lb

## 2021-10-25 DIAGNOSIS — A084 Viral intestinal infection, unspecified: Secondary | ICD-10-CM | POA: Diagnosis not present

## 2021-10-25 DIAGNOSIS — B351 Tinea unguium: Secondary | ICD-10-CM | POA: Diagnosis not present

## 2021-10-25 NOTE — Progress Notes (Signed)
?[name] Young is a 51 y.o. female with the following history as recorded in EpicCare:  ?Patient Active Problem List  ? Diagnosis Date Noted  ? Elevated blood pressure reading 03/20/2017  ? Genetic testing 06/26/2016  ? Family history of breast cancer   ? Type 2 diabetes mellitus with hyperglycemia ([place]) 09/13/2015  ? Nonrheumatic mitral valve prolapse 03/29/2015  ? Vitamin B12 deficiency 03/12/2015  ? Dysplastic nevus of trunk 03/05/2012  ? HSV (herpes simplex virus) infection 02/06/2012  ? DUB (dysfunctional uterine bleeding) 02/06/2012  ? Dysplastic nevus of lower extremity, right 12/12/2011  ? Hyperlipidemia 10/23/2008  ? ALLERGIC REACTION 07/10/2007  ?  ?Current Outpatient Medications  ?Medication Sig Dispense Refill  ? acyclovir (ZOVIRAX) 400 MG tablet Take 2 tablets (800 mg total) by mouth 2 (two) times daily as needed. 50 tablet 2  ? Continuous Blood Gluc Receiver (DEXCOM G6 RECEIVER) DEVI Use as instructed to check blood sugars 1 each 0  ? Continuous Blood Gluc Sensor (DEXCOM G6 SENSOR) MISC Change sensor every 10 days 9 each 3  ? Continuous Blood Gluc Sensor (DEXCOM G7 SENSOR) MISC Use as directed 9 each 3  ? Continuous Blood Gluc Transmit (DEXCOM G6 TRANSMITTER) MISC Change every 3 months 1 each 3  ? empagliflozin (JARDIANCE) 25 MG TABS tablet Take 1 tablet (25 mg total) by mouth daily. 90 tablet 3  ? fluticasone (FLONASE) 50 MCG/ACT nasal spray USE 1 SPRAY INTO BOTH NOSTRILS AT BEDTIME 16 g 6  ? insulin glargine-yfgn (SEMGLEE) 100 UNIT/ML Pen Inject 8-10 Units into the skin daily. 15 mL 3  ? Insulin Lispro-aabc (LYUMJEV KWIKPEN) 100 UNIT/ML KwikPen Inject 6-8 Units into the skin 2 (two) times daily. 15 mL 3  ? Lancets (FREESTYLE) lancets Use to check blood sugar 2 times a day. 200 each 12  ? metFORMIN (GLUCOPHAGE) 1000 MG tablet Take 2 tablets (2,000 mg total) by mouth daily with supper. 180 tablet 3  ? Multiple Vitamins-Minerals (DIASENSE MULTIVITAMIN) TABS Take 1 tablet by mouth daily.    ?  norethindrone-ethinyl estradiol 1/35 (ALAYCEN 1/35) tablet Take 1 tablet by mouth daily. 84 tablet 1  ? Omeprazole (PRILOSEC PO) Take by mouth as needed.     ? simvastatin (ZOCOR) 20 MG tablet Take 1 tablet (20 mg total) by mouth at bedtime. 90 tablet 3  ? terbinafine (LAMISIL) 250 MG tablet Take 1 tablet (250 mg total) by mouth daily. 90 tablet 0  ? tirzepatide (MOUNJARO) 12.5 MG/0.5ML Pen Inject 12.5 mg into the skin once a week. 2 mL 0  ? vitamin B-12 (CYANOCOBALAMIN) 1000 MCG tablet Take 1,000 mcg by mouth daily.     ? tirzepatide (MOUNJARO) 10 MG/0.5ML Pen Inject 10 mg into the skin once a week. (Patient not taking: Reported on 10/25/2021) 4 mL 11  ? ?No current facility-administered medications for this visit.  ?  ?Allergies: Penicillins and Shingrix [zoster vac recomb adjuvanted]  ?Past Medical History:  ?Diagnosis Date  ? Diabetes mellitus   ? Type II  ? Family history of breast cancer   ? Family history of breast cancer   ?  ?No past surgical history on file.  ?Family History  ?Problem Relation Age of Onset  ? Diabetes Other   ?     Familly Hx First degree relative  ? Breast cancer Mother 34  ? Breast cancer Maternal Aunt 57  ? Brain cancer Maternal Uncle 66  ? Colon cancer Neg Hx   ? Esophageal cancer Neg Hx   ?  Stomach cancer Neg Hx   ? Rectal cancer Neg Hx   ?  ?Social History  ? ?Tobacco Use  ? Smoking status: Never  ? Smokeless tobacco: Never  ?Substance Use Topics  ? Alcohol use: Yes  ?  Alcohol/week: 2.0 standard drinks  ?  Types: 2 Standard drinks or equivalent per week  ?  Comment: socially  ?  ?Subjective:  ? ?Presents with concerns for 24 hour history of nausea/ abdominal cramping/ diarrhea; notes that her husband had similar symptoms last week; states she has actually started to feel much better as the day has gone on; was concerned about "sulfa taste" when she burped this morning but notes this has since resolved as well;  ?No fever, no blood in stool;  ? ? ?Objective:  ?Vitals:  ? 10/25/21 1321   ?BP: 100/68  ?Pulse: 96  ?Temp: 98.2 ?F (36.8 ?C)  ?SpO2: 98%  ?Weight: 134 lb (60.8 kg)  ?Height: '5\' 7"'$  (1.702 m)  ?  ?General: Well developed, well nourished, in no acute distress  ?Skin : Warm and dry.  ?Head: Normocephalic and atraumatic  ?Lungs: Respirations unlabored; clear to auscultation bilaterally without wheeze, rales, rhonchi  ?CVS exam: normal rate and regular rhythm.  ?Abdomen: Soft; nontender; nondistended; normoactive bowel sounds; no masses or hepatosplenomegaly  ?Neurologic: Alert and oriented; speech intact; face symmetrical; moves all extremities well; CNII-XII intact without focal deficit  ? ?Assessment:  ?1. Viral gastroenteritis   ?2. Nail fungus   ?  ?Plan:  ?Reassurance; symptoms have already improved as day has progressed; BRAT diet, need for fluids discussed; follow up worse, no better.  ? ?2.  She asks if she can get refill on Lamisil which her provider has given her in the past year; I will have her PCP let her know what she prefers to do.  ? ?This visit occurred during the SARS-CoV-2 public health emergency.  Safety protocols were in place, including screening questions prior to the visit, additional usage of staff PPE, and extensive cleaning of exam room while observing appropriate contact time as indicated for disinfecting solutions.  ? ? ?No follow-ups on file.  ?No orders of the defined types were placed in this encounter. ?  ?Requested Prescriptions  ? ? No prescriptions requested or ordered in this encounter  ?  ? ?

## 2021-10-25 NOTE — Patient Instructions (Signed)
Viral Gastroenteritis, Adult  Viral gastroenteritis is also known as the stomach flu. This condition may affect your stomach, your small intestine, and your large intestine. It can cause sudden watery poop (diarrhea), fever, and vomiting. This condition is caused by certain germs (viruses). These germs can be passed from person to person very easily (are contagious). Having watery poop and vomiting can make you feel weak and cause you to not have enough water in your body (get dehydrated). This can make you tired and thirsty, make you have a dry mouth, and make it so you pee (urinate) less often. It is important to replace the fluids that you lose from having watery poop and vomiting. What are the causes? You can get sick by catching germs from other people. You can also get sick by: Eating food, drinking water, or touching a surface that has the germs on it (is contaminated). Sharing utensils or other personal items with a person who is sick. What increases the risk? Having a weak body defense system (immune system). Living with one or more children who are younger than 2 years. Living in a nursing home. Going on cruise ships. What are the signs or symptoms? Symptoms of this condition start suddenly. Symptoms may last for a few days or for as long as a week. Common symptoms include: Watery poop. Vomiting. Other symptoms include: Fever. Headache. Feeling tired (fatigue). Pain in the belly (abdomen). Chills. Feeling weak. Feeling like you may vomit (nauseous). Muscle aches. Not feeling hungry. How is this treated? This condition typically goes away on its own. The focus of treatment is to replace the fluids that you lose. This condition may be treated with: An ORS (oral rehydration solution). This is a drink that helps you replace fluids and minerals your body lost. It is sold at pharmacies and stores. Medicines to help with your symptoms. Probiotic supplements to reduce symptoms of  watery poop. Fluids given through an IV tube, if needed. Older adults and people with other diseases or a weak body defense system are at higher risk for not having enough water in the body. Follow these instructions at home: Eating and drinking  Take an ORS as told by your doctor. Drink clear fluids in small amounts as you are able. Clear fluids include: Water. Ice chips. Fruit juice that has water added to it (is diluted). Low-calorie sports drinks. Drink enough fluid to keep your pee (urine) pale yellow. Eat small amounts of healthy foods every 3-4 hours as you are able. This may include whole grains, fruits, vegetables, lean meats, and yogurt. Avoid fluids that have a lot of sugar or caffeine in them. This includes energy drinks, sports drinks, and soda. Avoid spicy or fatty foods. Avoid alcohol. General instructions  Wash your hands often. This is very important after you have watery poop or you vomit. If you cannot use soap and water, use hand sanitizer. Make sure that all people in your home wash their hands well and often. Take over-the-counter and prescription medicines only as told by your doctor. Rest at home while you get better. Watch your condition for any changes. Take a warm bath to help with any burning or pain from having watery poop. Keep all follow-up visits. Contact a doctor if: You cannot keep fluids down. Your symptoms get worse. You have new symptoms. You feel light-headed or dizzy. You have muscle cramps. Get help right away if: You have chest pain. You have trouble breathing, or you are breathing very fast.   You have a fast heartbeat. You feel very weak or you faint. You have a very bad headache, a stiff neck, or both. You have a rash. You have very bad pain, cramping, or bloating in your belly. Your skin feels cold and clammy. You feel mixed up (confused). You have pain when you pee. You have signs of not having enough water in the body, such  as: Dark pee, hardly any pee, or no pee. Cracked lips. Dry mouth. Sunken eyes. Feeling very sleepy. Feeling weak. You have signs of bleeding, such as: You see blood in your vomit. Your vomit looks like coffee grounds. You have bloody or black poop or poop that looks like tar. These symptoms may be an emergency. Get help right away. Call 911. Do not wait to see if the symptoms will go away. Do not drive yourself to the hospital. Summary Viral gastroenteritis is also known as the stomach flu. This condition can cause sudden watery poop (diarrhea), fever, and vomiting. These germs can be passed from person to person very easily. Take an ORS (oral rehydration solution) as told by your doctor. This is a drink that is sold at pharmacies and stores. Wash your hands often, especially after having watery poop or vomiting. If you cannot use soap and water, use hand sanitizer. This information is not intended to replace advice given to you by your health care provider. Make sure you discuss any questions you have with your health care provider. Document Revised: 04/25/2021 Document Reviewed: 04/25/2021 Elsevier Patient Education  2023 [employer]  

## 2021-10-26 ENCOUNTER — Other Ambulatory Visit (HOSPITAL_COMMUNITY): Payer: Self-pay

## 2021-10-26 ENCOUNTER — Other Ambulatory Visit: Payer: Self-pay | Admitting: Family Medicine

## 2021-10-26 DIAGNOSIS — B009 Herpesviral infection, unspecified: Secondary | ICD-10-CM

## 2021-10-26 MED ORDER — ACYCLOVIR 400 MG PO TABS
800.0000 mg | ORAL_TABLET | Freq: Two times a day (BID) | ORAL | 2 refills | Status: DC | PRN
  Filled 2021-10-26: qty 50, 13d supply, fill #0

## 2021-10-26 MED ORDER — TERBINAFINE HCL 250 MG PO TABS
250.0000 mg | ORAL_TABLET | Freq: Every day | ORAL | 0 refills | Status: DC
  Filled 2021-10-26: qty 90, 90d supply, fill #0

## 2021-10-27 ENCOUNTER — Other Ambulatory Visit (HOSPITAL_COMMUNITY): Payer: Self-pay

## 2021-10-28 ENCOUNTER — Other Ambulatory Visit (HOSPITAL_COMMUNITY): Payer: Self-pay

## 2021-10-28 NOTE — Telephone Encounter (Signed)
We can work with the [facility] for now. ?

## 2021-11-01 ENCOUNTER — Encounter: Payer: Self-pay | Admitting: Family Medicine

## 2021-11-01 ENCOUNTER — Encounter: Payer: Self-pay | Admitting: Internal Medicine

## 2021-11-01 ENCOUNTER — Other Ambulatory Visit (HOSPITAL_COMMUNITY): Payer: Self-pay

## 2021-11-01 DIAGNOSIS — T7840XD Allergy, unspecified, subsequent encounter: Secondary | ICD-10-CM

## 2021-11-01 MED ORDER — FLUTICASONE PROPIONATE 50 MCG/ACT NA SUSP
1.0000 | Freq: Every evening | NASAL | 6 refills | Status: DC
  Filled 2021-11-01: qty 16, 60d supply, fill #0
  Filled 2022-05-15: qty 16, 60d supply, fill #1
  Filled 2022-08-28: qty 16, 60d supply, fill #2

## 2021-11-03 ENCOUNTER — Other Ambulatory Visit (HOSPITAL_COMMUNITY): Payer: Self-pay

## 2021-11-04 ENCOUNTER — Encounter: Payer: Self-pay | Admitting: Internal Medicine

## 2021-11-04 ENCOUNTER — Other Ambulatory Visit (HOSPITAL_COMMUNITY): Payer: Self-pay

## 2021-11-04 ENCOUNTER — Other Ambulatory Visit: Payer: Self-pay | Admitting: Internal Medicine

## 2021-11-04 DIAGNOSIS — E1165 Type 2 diabetes mellitus with hyperglycemia: Secondary | ICD-10-CM

## 2021-11-04 MED ORDER — MOUNJARO 10 MG/0.5ML ~~LOC~~ SOAJ
10.0000 mg | SUBCUTANEOUS | 11 refills | Status: DC
  Filled 2021-11-04: qty 2, 28d supply, fill #0
  Filled 2021-11-29 – 2021-12-02 (×2): qty 2, 28d supply, fill #1

## 2021-11-07 NOTE — Telephone Encounter (Signed)
Refill was approved 11/04/21. Pt contacted by pharmacy. ?

## 2021-11-29 ENCOUNTER — Encounter: Payer: Self-pay | Admitting: Internal Medicine

## 2021-11-29 ENCOUNTER — Other Ambulatory Visit (HOSPITAL_COMMUNITY): Payer: Self-pay

## 2021-11-29 DIAGNOSIS — E1165 Type 2 diabetes mellitus with hyperglycemia: Secondary | ICD-10-CM

## 2021-11-30 ENCOUNTER — Other Ambulatory Visit (HOSPITAL_COMMUNITY): Payer: Self-pay

## 2021-12-01 ENCOUNTER — Other Ambulatory Visit (HOSPITAL_COMMUNITY): Payer: Self-pay

## 2021-12-02 ENCOUNTER — Other Ambulatory Visit: Payer: Self-pay

## 2021-12-02 ENCOUNTER — Other Ambulatory Visit (HOSPITAL_COMMUNITY): Payer: Self-pay

## 2021-12-06 ENCOUNTER — Other Ambulatory Visit (HOSPITAL_COMMUNITY): Payer: Self-pay

## 2021-12-06 MED ORDER — TIRZEPATIDE 12.5 MG/0.5ML ~~LOC~~ SOAJ
12.5000 mg | SUBCUTANEOUS | 0 refills | Status: AC
  Filled 2021-12-06 – 2022-09-07 (×4): qty 2, 28d supply, fill #0

## 2021-12-07 NOTE — Progress Notes (Unsigned)
Patient ID: [name] Young, female   DOB: Dec 17, 1970, 51 y.o.   MRN: [id number]  HPI: [name] Young is a 51 y.o.-year-old female, presenting for f/u for DM2, dx in 2008 (GDM 1998), insulin-dependent, uncontrolled, without long-term complications. Last visit 4 months ago.  Interim history: No increased urination, blurry vision, nausea, chest pain. She wanted to switch to the Dexcom G7 CGM, but we could not do this as this is excluded from her insurance plan.  DM2: Reviewed HbA1c levels: Lab Results  Component Value Date   HGBA1C 6.3 (A) 12/08/2021   HGBA1C 7.1 (A) 08/05/2021   HGBA1C 6.9 (A) 05/03/2021   HGBA1C 8.9 (H) 12/29/2020   HGBA1C 7.3 (A) 09/28/2020   HGBA1C 7.0 (A) 05/31/2020   HGBA1C 7.5 (H) 01/05/2020   HGBA1C 6.8 (A) 06/04/2019   HGBA1C 7.8 (H) 02/28/2019   HGBA1C 7.6 (A) 10/22/2018   HGBA1C 6.9 (A) 03/22/2018   HGBA1C 6.7 11/19/2017   HGBA1C 6.7 08/21/2017   HGBA1C 6.5 04/20/2017   HGBA1C 6.7 01/19/2017   HGBA1C 6.4 09/07/2016   HGBA1C 6.6 (H) 03/16/2016   HGBA1C 6.5 03/16/2016   HGBA1C 6.5 12/13/2015   HGBA1C 6.4 09/13/2015   HGBA1C 7.6 06/14/2015   HGBA1C 6.8 (H) 03/22/2015   HGBA1C 6.5 03/12/2015   HGBA1C 7.0 (H) 12/10/2014   HGBA1C 7.6 (H) 08/26/2014   HGBA1C 7.5 (H) 05/15/2014   HGBA1C 7.3 (H) 02/09/2014   HGBA1C 7.1 (H) 01/28/2013   HGBA1C 6.9 (H) 08/02/2012   HGBA1C 6.6 (H) 01/25/2012    Since she was dx with DM >> she lost 50 lbs and was able to come off Lantus.  She is on: - Metformin 2000 mg with dinner - Jardiance 25 mg daily - Lantus 8 units at bedtime - added 07/2021 - Lyumjev 10 units before dinner - started 09/2020 >> Lyumjev 8 units before dinner - Mounjaro 10 mg weekly She was on Trulicity 1.5 mg weekly -increased 10/2018 >> Ozempic 1 mg prev. She was on Lantus before. She was on Glipizide.  Pt checks her sugars >4 times a day now with a Dexcom CGM:   Previously:   Lowest sugar was 49 (alcohol) >> ...  58 (alcohol) >> 70 >>  60s >> ; she has hypoglycemia awareness in the 60s Highest sugar was  270s >> 200s >> 317.  Glucometer: [employer] >> [employer]  Pt's meals are: - Breakfast: Eggs or oatmeal >> bacon and eggs - Lunch: Salad or veggies or tuna salad with saltines or chicken tenders - Dinner: Fast food -take out - Snacks: Graham crackers  -No CKD, last BUN/creatinine:  Lab Results  Component Value Date   BUN 19 09/28/2020   CREATININE 0.71 09/28/2020   ACR normal.  Previously on lisinopril but now off. Lab Results  Component Value Date   MICRALBCREAT 1.8 09/28/2020   MICRALBCREAT 1.7 01/05/2020   MICRALBCREAT 0.8 10/22/2018   MICRALBCREAT 0.6 04/20/2017   MICRALBCREAT 0.7 03/16/2016   MICRALBCREAT 0.7 03/22/2015   MICRALBCREAT 0.7 10/27/2014   MICRALBCREAT 0.1 02/09/2014   MICRALBCREAT 0.1 01/28/2013   MICRALBCREAT 0.3 06/07/2011   -+ HL; last set of lipids: Lab Results  Component Value Date   CHOL 165 09/28/2020   HDL 65.70 09/28/2020   LDLCALC 77 09/28/2020   LDLDIRECT 62.0 03/22/2015   TRIG 116.0 09/28/2020   CHOLHDL 3 09/28/2020  On Zocor 20.  - last eye exam was in 01/2021: reportedly No [facility].   -She has numbness and  tingling in her feet.  Last foot exam August 05, 2021.  Vit B12 def. -She continues on B complex and 1000 mcg B12 daily  Review vitamin B12 levels: Lab Results  Component Value Date   VITAMINB12 963 (H) 09/28/2020   VITAMINB12 490 01/05/2020   VITAMINB12 1,216 (H) 02/28/2019   VITAMINB12 890 10/22/2018   VITAMINB12 909 12/05/2017   VITAMINB12 782 01/24/2017   VITAMINB12 969 (H) 03/16/2016   VITAMINB12 503 09/13/2015   VITAMINB12 923 (H) 03/12/2015   VITAMINB12 220 12/10/2014   ROS: + see HPI Neurological: no tremors/+ numbness/+ tingling/no dizziness  I reviewed pt's medications, allergies, PMH, social hx, family hx, and changes were documented in the history of present illness. Otherwise, unchanged from my initial visit note.  Past  Medical History:  Diagnosis Date   Diabetes mellitus    Type II   Family history of breast cancer    Family history of breast cancer    No past surgical history on file. 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 50 tablet 2   Continuous Blood Gluc Receiver (DEXCOM G6 RECEIVER) DEVI Use as instructed to check blood sugars 1 each 0   Continuous Blood Gluc Sensor (DEXCOM G6 SENSOR) MISC Change sensor every 10 days 9 each 3   Continuous Blood Gluc Sensor (DEXCOM G7 SENSOR) MISC Use as directed 9 each 3   Continuous Blood Gluc Transmit (DEXCOM G6 TRANSMITTER) MISC Change every 3 months 1 each 3   empagliflozin (JARDIANCE) 25 MG TABS tablet Take 1 tablet (25 mg total) by mouth daily. 90 tablet 3   fluticasone (FLONASE) 50 MCG/ACT nasal spray Place 1 spray into both nostrils at bedtime. 16 g 6   insulin glargine-yfgn (SEMGLEE) 100 UNIT/ML Pen Inject 8-10 Units into the skin daily. 15 mL 3   Insulin Lispro-aabc (LYUMJEV KWIKPEN) 100 UNIT/ML KwikPen Inject 6-8 Units into the skin 2 (two) times daily. 15 mL 3   Lancets (FREESTYLE) lancets Use to check blood sugar 2 times a day. 200 each 12   metFORMIN (GLUCOPHAGE) 1000 MG tablet Take 2 tablets (2,000 mg total) by mouth daily with supper. 180 tablet 3   Multiple Vitamins-Minerals (DIASENSE MULTIVITAMIN) TABS Take 1 tablet by mouth daily.     norethindrone-ethinyl estradiol 1/35 (ALAYCEN 1/35) tablet Take 1 tablet by mouth daily. 84 tablet 1   Omeprazole (PRILOSEC  PO) Take by mouth as needed.      simvastatin (ZOCOR) 20 MG tablet Take 1 tablet (20 mg total) by mouth at bedtime. 90 tablet 3   terbinafine (LAMISIL) 250 MG tablet Take 1 tablet (250 mg total) by mouth daily. Take for 12 weeks for toenail fungus 90 tablet 0   tirzepatide (MOUNJARO) 12.5 MG/0.5ML Pen Inject 12.5 mg into the skin once a week. 2 mL 0   vitamin B-12 (CYANOCOBALAMIN) 1000 MCG tablet Take 1,000 mcg by mouth daily.      [  DISCONTINUED] glipiZIDE (GLUCOTROL) 5 MG tablet Take 1 tablet (5 mg total) by mouth daily. 90 tablet 3   No current facility-administered medications on file prior to visit.   Allergies  Allergen Reactions   Penicillins     REACTION: RASH   Shingrix [Zoster Vac Recomb Adjuvanted]     Patient developed itching at site a couple of days after shot.  Decided not to administer second dose   Family History  Problem Relation Age of Onset   Diabetes Other        Familly Hx First degree relative   Breast cancer Mother 20   Breast cancer Maternal Aunt 46   Brain cancer Maternal Uncle 58   Colon cancer Neg Hx    Esophageal cancer Neg Hx    Stomach cancer Neg Hx    Rectal cancer Neg Hx    PE: BP 118/72 (BP Location: Right Arm, Patient Position: Sitting, Cuff Size: Normal)   Pulse 68   Ht '5\' 7"'$  (1.702 m)   Wt 135 lb 12.8 oz (61.6 kg)   SpO2 98%   BMI 21.27 kg/m     Wt Readings from Last 3 Encounters:  12/08/21 135 lb 12.8 oz (61.6 kg)  10/25/21 134 lb (60.8 kg)  08/05/21 138 lb (62.6 kg)   Constitutional: normal weight, in NAD Eyes: PERRLA, EOMI, no exophthalmos ENT: moist mucous membranes, no thyromegaly, no cervical lymphadenopathy Cardiovascular: Tachycardia, RR, No MRG Respiratory: CTA B Musculoskeletal: no deformities, strength intact in all 4 Skin: moist, warm, no rashes Neurological: no tremor with outstretched hands, DTR normal in all 4  ASSESSMENT: 1. DM2, insulin-dependent, uncontrolled, without long term complications, but with  hyperglycemia  2. B12 deficiency  3. HL  PLAN:  1. Patient with longstanding uncontrolled type 2 diabetes, on a complex medication regimen including metformin, insulin inhibitor, GLP-1 receptor agonist, and also Lyumjev as needed for correction of high blood sugars.  HbA1c was improved to 6.9%, after we switched to [facility].  She did lose a significant amount of weight on this.  However, at last visit, HbA1c was higher, at 7.1%.  At that time, we added Lantus 8 units at bedtime and I also recommended to use Lyumjev before breakfast and dinner.  At this visit, due to the improvement in her blood sugars, she only takes Lyumjev 8 units before dinner. -We could consider a mechanical pump (for example CeQur) or regular insulin pump for her in the future. CGM interpretation: -At today's visit, we reviewed her CGM downloads: It appears that 78% of values are in target range (goal >70%), while 22% are higher than 180 (goal <25%), and 0% are lower than 70 (goal <4%).  The calculated average blood sugar is 155.  The projected HbA1c for the next 3 months (GMI) is 7.0%. -Reviewing the CGM trends, sugars appear to be fluctuating mostly in the normal range throughout the day, but they are higher after dinner, with values in the 180s to 200s especially in the last 2 weeks.  We discussed that with some dinners, she may need to increase the Lyumjev to 10 units, but otherwise, we can continue with the current regimen. - I suggested to:  Patient Instructions  Please continue: - Metformin 2000 mg with dinner - Jardiance 25 mg daily - Mounjaro 10 mg weekly - Lantus 8 units at bedtime  Please: - Lyumjev 8-10 units before dinner  Please return in 4 months.  - we checked her HbA1c: 6.3% (best in a long time!) -  advised to check sugars at different times of the day - 4x a day, rotating check times - advised for yearly eye exams >> she is UTD - return to clinic in 4 months  2. B12 deficiency -Latest B12 level  was slightly high: Lab Results  Component Value Date   VITAMINB12 963 (H) 09/28/2020  -She continues on B12 1000 mcg daily in the B complex -She will have this rechecked at next visit with PCP which is next month  3.  HL -Reviewed latest lipid panel from 09/2020: Fractions at goal: Lab Results  Component Value Date   CHOL 165 09/28/2020   HDL 65.70 09/28/2020   LDLCALC 77 09/28/2020   LDLDIRECT 62.0 03/22/2015   TRIG 116.0 09/28/2020   CHOLHDL 3 09/28/2020  -She continues on Zocor 10 mg daily without side effects -She is due for another lipid panel -has annual physical exam with PCP pending next month  Philemon Kingdom, MD PhD [facility]

## 2021-12-08 ENCOUNTER — Encounter: Payer: Self-pay | Admitting: Internal Medicine

## 2021-12-08 ENCOUNTER — Ambulatory Visit (INDEPENDENT_AMBULATORY_CARE_PROVIDER_SITE_OTHER): Payer: 59 | Admitting: Internal Medicine

## 2021-12-08 VITALS — BP 118/72 | HR 68 | Ht 67.0 in | Wt 135.8 lb

## 2021-12-08 DIAGNOSIS — E1165 Type 2 diabetes mellitus with hyperglycemia: Secondary | ICD-10-CM | POA: Diagnosis not present

## 2021-12-08 DIAGNOSIS — E538 Deficiency of other specified B group vitamins: Secondary | ICD-10-CM | POA: Diagnosis not present

## 2021-12-08 DIAGNOSIS — E782 Mixed hyperlipidemia: Secondary | ICD-10-CM | POA: Diagnosis not present

## 2021-12-08 LAB — POCT GLYCOSYLATED HEMOGLOBIN (HGB A1C): Hemoglobin A1C: 6.3 % — AB (ref 4.0–5.6)

## 2021-12-08 NOTE — Patient Instructions (Addendum)
Please continue: - Metformin 2000 mg with dinner - Jardiance 25 mg daily - Mounjaro 10 mg weekly - Lantus 8 units at bedtime  Please: - Lyumjev 8-10 units before dinner  Please return in 4 months.

## 2021-12-19 DIAGNOSIS — E119 Type 2 diabetes mellitus without complications: Secondary | ICD-10-CM | POA: Diagnosis not present

## 2021-12-19 LAB — HM DIABETES EYE EXAM

## 2021-12-26 ENCOUNTER — Other Ambulatory Visit (HOSPITAL_COMMUNITY): Payer: Self-pay

## 2021-12-26 ENCOUNTER — Other Ambulatory Visit: Payer: Self-pay

## 2021-12-26 DIAGNOSIS — E1165 Type 2 diabetes mellitus with hyperglycemia: Secondary | ICD-10-CM

## 2021-12-26 MED ORDER — MOUNJARO 10 MG/0.5ML ~~LOC~~ SOAJ
10.0000 mg | SUBCUTANEOUS | 1 refills | Status: DC
  Filled 2021-12-26 (×2): qty 2, 28d supply, fill #0
  Filled 2022-01-17: qty 2, 28d supply, fill #1

## 2021-12-27 ENCOUNTER — Other Ambulatory Visit (HOSPITAL_COMMUNITY): Payer: Self-pay

## 2022-01-04 ENCOUNTER — Other Ambulatory Visit (HOSPITAL_COMMUNITY): Payer: Self-pay

## 2022-01-05 ENCOUNTER — Encounter: Payer: Self-pay | Admitting: Family Medicine

## 2022-01-18 ENCOUNTER — Other Ambulatory Visit (HOSPITAL_COMMUNITY): Payer: Self-pay

## 2022-01-19 ENCOUNTER — Other Ambulatory Visit (HOSPITAL_COMMUNITY): Payer: Self-pay

## 2022-01-26 ENCOUNTER — Other Ambulatory Visit: Payer: Self-pay | Admitting: Internal Medicine

## 2022-01-26 ENCOUNTER — Other Ambulatory Visit (HOSPITAL_COMMUNITY): Payer: Self-pay

## 2022-01-26 DIAGNOSIS — E119 Type 2 diabetes mellitus without complications: Secondary | ICD-10-CM

## 2022-01-27 ENCOUNTER — Other Ambulatory Visit (HOSPITAL_COMMUNITY): Payer: Self-pay

## 2022-01-27 MED ORDER — METFORMIN HCL 1000 MG PO TABS
2000.0000 mg | ORAL_TABLET | Freq: Every day | ORAL | 3 refills | Status: DC
  Filled 2022-01-27: qty 180, 90d supply, fill #0
  Filled 2022-05-15: qty 180, 90d supply, fill #1
  Filled 2022-08-14: qty 180, 90d supply, fill #2
  Filled 2022-10-24: qty 180, 90d supply, fill #3

## 2022-02-07 NOTE — Progress Notes (Signed)
[place] [facility] [place] [facility] [place], [place], [place] [address] [phone]  Date:  02/13/2022   Name:  [name] Young   DOB:  1970-10-09   MRN:  [id number]  PCP:  Darreld Mclean, MD    Chief Complaint: Annual Exam (Concerns/ questions: pt says she is finally on the mends from her URI/Cologuard: pt would like a referral to GI./Shingrix: 1 dose- reaction with it. )   History of Present Illness:  [name] Young is a 51 y.o. very pleasant female patient who presents with the following:  Patient seen today for physical exam- she was here on 8/3 with illness and saw Mickel Baas.  She is feeling much better.  She did test - for covid but would like to test again today as her sx are suspicious. Fever resolved last week and she is now feeling basically back to normal.  She is still wearing a mask at work  Her daughter got married recently - the wedding went off well  History of diabetes, hyperlipidemia, B12 deficiency, elevated blood pressure not currently on any blood pressure medication  Most recent visit with myself about 1 year ago-at that time she had recently changed to the short stay unit at the hospital, she works as a Marine scientist. She likes this unit still Married to [place], they enjoy raising [place]- they may breed one more coming up   COVID series. She had the 2 shots but no booster  She had Zostavax in 2021, delay Shingrix for now Due for urine microalbumin She had a colonoscopy in 2021, asked to follow-up in 3 years- 2024 Due for lab update, except A1c was done in June-6.3% per Dr. Cruzita Lederer  Continuous blood glucose monitor Jardiance Insulin glargine Insulin lispro KwikPen Metformin 2000 mg daily OCP Simvastatin Mounjaro Patient Active Problem List   Diagnosis Date Noted   Elevated blood pressure reading 03/20/2017   Genetic testing 06/26/2016   Family history of breast cancer    Type 2 diabetes mellitus  with hyperglycemia ([place]) 09/13/2015   Nonrheumatic mitral valve prolapse 03/29/2015   Vitamin B12 deficiency 03/12/2015   Dysplastic nevus of trunk 03/05/2012   HSV (herpes simplex virus) infection 02/06/2012   DUB (dysfunctional uterine bleeding) 02/06/2012   Dysplastic nevus of lower extremity, right 12/12/2011   Hyperlipidemia 10/23/2008   ALLERGIC REACTION 07/10/2007    Past Medical History:  Diagnosis Date   Diabetes mellitus    Type II   Family history of breast cancer    Family history of breast cancer     No past surgical history on file.  Social History   Tobacco Use   Smoking status: Never   Smokeless tobacco: Never  Vaping Use   Vaping Use: Never used  Substance Use Topics   Alcohol use: Yes    Alcohol/week: 2.0 standard drinks of alcohol    Types: 2 Standard drinks or equivalent per week    Comment: socially   Drug use: No    Family History  Problem Relation Age of Onset   Diabetes Other        Familly Hx First degree relative   Breast cancer Mother 43   Breast cancer Maternal Aunt 64   Brain cancer Maternal Uncle 58   Colon cancer Neg Hx    Esophageal cancer Neg Hx    Stomach cancer Neg Hx    Rectal cancer Neg Hx     Allergies  Allergen  Reactions   Penicillins     REACTION: RASH   Shingrix [Zoster Vac Recomb Adjuvanted]     Patient developed itching at site a couple of days after shot.  Decided not to administer second dose    Medication list has been reviewed and updated.  Current Outpatient Medications on File Prior to Visit  Medication Sig Dispense Refill   Continuous Blood Gluc Receiver (DEXCOM G6 RECEIVER) DEVI Use as instructed to check blood sugars 1 each 0   Continuous Blood Gluc Sensor (DEXCOM G6 SENSOR) MISC Change sensor every 10 days 9 each 3   Continuous Blood Gluc Sensor (DEXCOM G7 SENSOR) MISC Use as directed 9 each 3   Continuous Blood Gluc Transmit (DEXCOM G6 TRANSMITTER) MISC Change every 3 months 1 each 3   empagliflozin  (JARDIANCE) 25 MG TABS tablet Take 1 tablet (25 mg total) by mouth daily. 90 tablet 3   fluticasone (FLONASE) 50 MCG/ACT nasal spray Place 1 spray into both nostrils at bedtime. 16 g 6   insulin glargine-yfgn (SEMGLEE) 100 UNIT/ML Pen Inject 8-10 Units into the skin daily. 15 mL 3   Insulin Lispro-aabc (LYUMJEV KWIKPEN) 100 UNIT/ML KwikPen Inject 6-8 Units into the skin 2 (two) times daily. 15 mL 3   Lancets (FREESTYLE) lancets Use to check blood sugar 2 times a day. 200 each 12   metFORMIN (GLUCOPHAGE) 1000 MG tablet Take 2 tablets (2,000 mg total) by mouth daily with supper. 180 tablet 3   Multiple Vitamins-Minerals (DIASENSE MULTIVITAMIN) TABS Take 1 tablet by mouth daily.     norethindrone-ethinyl estradiol 1/35 (ALAYCEN 1/35) tablet Take 1 tablet by mouth daily. 84 tablet 1   Omeprazole (PRILOSEC PO) Take by mouth as needed.      simvastatin (ZOCOR) 20 MG tablet Take 1 tablet (20 mg total) by mouth at bedtime. 90 tablet 0   terbinafine (LAMISIL) 250 MG tablet Take 1 tablet (250 mg total) by mouth daily. Take for 12 weeks for toenail fungus 90 tablet 0   tirzepatide (MOUNJARO) 10 MG/0.5ML Pen Inject 10 mg into the skin once a week. 2 mL 1   tirzepatide (MOUNJARO) 12.5 MG/0.5ML Pen Inject 12.5 mg into the skin once a week. 2 mL 0   vitamin B-12 (CYANOCOBALAMIN) 1000 MCG tablet Take 1,000 mcg by mouth daily.      [DISCONTINUED] glipiZIDE (GLUCOTROL) 5 MG tablet Take 1 tablet (5 mg total) by mouth daily. 90 tablet 3   No current facility-administered medications on file prior to visit.    Review of Systems:  As per HPI- otherwise negative.  BP Readings from Last 3 Encounters:  02/13/22 100/64  02/09/22 98/70  12/08/21 118/72    Physical Examination: Vitals:   02/13/22 1434  BP: 100/64  Pulse: 93  Resp: 18  Temp: 97.7 F (36.5 C)  SpO2: 97%   Vitals:   02/13/22 1434  Weight: 131 lb (59.4 kg)  Height: 5' 7"  (1.702 m)   Body mass index is 20.52 kg/m. Ideal Body Weight:  Weight in (lb) to have BMI = 25: 159.3  GEN: no acute distress. Normal weight, looks well  HEENT: Atraumatic, Normocephalic. Bilateral TM wnl, oropharynx normal.  PEERL,EOMI.   Ears and Nose: No external deformity. CV: RRR, No M/G/R. No JVD. No thrill. No extra heart sounds. PULM: CTA B, no wheezes, crackles, rhonchi. No retractions. No resp. distress. No accessory muscle use. ABD: S, NT, ND, +BS. No rebound. No HSM. EXTR: No c/c/e PSYCH: Normally interactive. Conversant.   Results  for orders placed or performed in visit on 02/13/22  POC COVID-19 BinaxNow  Result Value Ref Range   SARS Coronavirus 2 Ag Positive (A) Negative    Assessment and Plan: Physical exam  Mixed hyperlipidemia - Plan: Lipid panel  Type 2 diabetes mellitus without complication, without long-term current use of insulin (HCC) - Plan: Comprehensive metabolic panel, Microalbumin / creatinine urine ratio  B12 deficiency - Plan: CBC, B12  Screening for thyroid disorder - Plan: TSH  Fever, unspecified - Plan: POC COVID-19 BinaxNow  Physical exam today.  Encouraged healthy diet and exercise routine Will plan further follow- up pending labs. She also happened to have COVID-19, convalescent phase and 8 days out from being in symptoms.  She is doing fine, no major concerns right now.  Too late to start antivirals.  I encouraged her to continue wearing a mask for the rest of this week and please let me know if any worsening  Signed Lamar Blinks, MD   Received labs 8/8- message to pt  Results for orders placed or performed in visit on 02/13/22  CBC  Result Value Ref Range   WBC 5.2 4.0 - 10.5 K/uL   RBC 4.29 3.87 - 5.11 Mil/uL   Platelets 219.0 150.0 - 400.0 K/uL   Hemoglobin 13.2 12.0 - 15.0 g/dL   HCT 39.2 36.0 - 46.0 %   MCV 91.5 78.0 - 100.0 fl   MCHC 33.7 30.0 - 36.0 g/dL   RDW 13.0 11.5 - 15.5 %  Comprehensive metabolic panel  Result Value Ref Range   Sodium 138 135 - 145 mEq/L   Potassium 3.9  3.5 - 5.1 mEq/L   Chloride 100 96 - 112 mEq/L   CO2 28 19 - 32 mEq/L   Glucose, Bld 135 (H) 70 - 99 mg/dL   BUN 17 6 - 23 mg/dL   Creatinine, Ser 0.76 0.40 - 1.20 mg/dL   Total Bilirubin 0.5 0.2 - 1.2 mg/dL   Alkaline Phosphatase 60 39 - 117 U/L   AST 21 0 - 37 U/L   ALT 31 0 - 35 U/L   Total Protein 6.8 6.0 - 8.3 g/dL   Albumin 4.2 3.5 - 5.2 g/dL   GFR 89.36 >60.00 mL/min   Calcium 9.4 8.4 - 10.5 mg/dL  Lipid panel  Result Value Ref Range   Cholesterol 169 0 - 200 mg/dL   Triglycerides 178.0 (H) 0.0 - 149.0 mg/dL   HDL 61.10 >39.00 mg/dL   VLDL 35.6 0.0 - 40.0 mg/dL   LDL Cholesterol 73 0 - 99 mg/dL   Total CHOL/HDL Ratio 3    NonHDL 108.17   TSH  Result Value Ref Range   TSH 1.45 0.35 - 5.50 uIU/mL  Microalbumin / creatinine urine ratio  Result Value Ref Range   Microalb, Ur <0.7 0.0 - 1.9 mg/dL   Creatinine,U 116.5 mg/dL   Microalb Creat Ratio 0.6 0.0 - 30.0 mg/g  B12  Result Value Ref Range   Vitamin B-12 1,034 (H) 211 - 911 pg/mL  POC COVID-19 BinaxNow  Result Value Ref Range   SARS Coronavirus 2 Ag Positive (A) Negative

## 2022-02-09 ENCOUNTER — Ambulatory Visit: Payer: 59 | Admitting: Family

## 2022-02-09 ENCOUNTER — Encounter: Payer: Self-pay | Admitting: Family Medicine

## 2022-02-09 ENCOUNTER — Encounter: Payer: Self-pay | Admitting: Family

## 2022-02-09 VITALS — BP 98/70 | HR 118 | Temp 98.5°F | Resp 20 | Ht 67.0 in | Wt 126.0 lb

## 2022-02-09 DIAGNOSIS — J069 Acute upper respiratory infection, unspecified: Secondary | ICD-10-CM

## 2022-02-09 DIAGNOSIS — R000 Tachycardia, unspecified: Secondary | ICD-10-CM | POA: Diagnosis not present

## 2022-02-09 NOTE — Telephone Encounter (Signed)
Called and spoke with pt. Appt at 11 am today to see LM

## 2022-02-09 NOTE — Progress Notes (Signed)
[name] Young is a 51 y.o. female with the following history as recorded in [employer]:  Patient Active Problem List   Diagnosis Date Noted   Elevated blood pressure reading 03/20/2017   Genetic testing 06/26/2016   Family history of breast cancer    Type 2 diabetes mellitus with hyperglycemia ([place]) 09/13/2015   Nonrheumatic mitral valve prolapse 03/29/2015   Vitamin B12 deficiency 03/12/2015   Dysplastic nevus of trunk 03/05/2012   HSV (herpes simplex virus) infection 02/06/2012   DUB (dysfunctional uterine bleeding) 02/06/2012   Dysplastic nevus of lower extremity, right 12/12/2011   Hyperlipidemia 10/23/2008   ALLERGIC REACTION 07/10/2007    Current Outpatient Medications  Medication Sig Dispense Refill   acyclovir (ZOVIRAX) 400 MG tablet Take 2 tablets (800 mg total) by mouth 2 (two) times daily as needed. 50 tablet 2   Continuous Blood Gluc Receiver (DEXCOM G6 RECEIVER) DEVI Use as instructed to check blood sugars 1 each 0   Continuous Blood Gluc Sensor (DEXCOM G6 SENSOR) MISC Change sensor every 10 days 9 each 3   Continuous Blood Gluc Sensor (DEXCOM G7 SENSOR) MISC Use as directed 9 each 3   Continuous Blood Gluc Transmit (DEXCOM G6 TRANSMITTER) MISC Change every 3 months 1 each 3   empagliflozin (JARDIANCE) 25 MG TABS tablet Take 1 tablet (25 mg total) by mouth daily. 90 tablet 3   fluticasone (FLONASE) 50 MCG/ACT nasal spray Place 1 spray into both nostrils at bedtime. 16 g 6   insulin glargine-yfgn (SEMGLEE) 100 UNIT/ML Pen Inject 8-10 Units into the skin daily. 15 mL 3   Insulin Lispro-aabc (LYUMJEV KWIKPEN) 100 UNIT/ML KwikPen Inject 6-8 Units into the skin 2 (two) times daily. 15 mL 3   Lancets (FREESTYLE) lancets Use to check blood sugar 2 times a day. 200 each 12   metFORMIN (GLUCOPHAGE) 1000 MG tablet Take 2 tablets (2,000 mg total) by mouth daily with supper. 180 tablet 3   Multiple Vitamins-Minerals (DIASENSE MULTIVITAMIN) TABS Take 1 tablet by mouth daily.      norethindrone-ethinyl estradiol 1/35 (ALAYCEN 1/35) tablet Take 1 tablet by mouth daily. 84 tablet 1   Omeprazole (PRILOSEC PO) Take by mouth as needed.      simvastatin (ZOCOR) 20 MG tablet Take 1 tablet (20 mg total) by mouth at bedtime. 90 tablet 3   terbinafine (LAMISIL) 250 MG tablet Take 1 tablet (250 mg total) by mouth daily. Take for 12 weeks for toenail fungus 90 tablet 0   tirzepatide (MOUNJARO) 10 MG/0.5ML Pen Inject 10 mg into the skin once a week. 2 mL 1   tirzepatide (MOUNJARO) 12.5 MG/0.5ML Pen Inject 12.5 mg into the skin once a week. 2 mL 0   vitamin B-12 (CYANOCOBALAMIN) 1000 MCG tablet Take 1,000 mcg by mouth daily.      No current facility-administered medications for this visit.    Allergies: Penicillins and Shingrix [zoster vac recomb adjuvanted]  Past Medical History:  Diagnosis Date   Diabetes mellitus    Type II   Family history of breast cancer    Family history of breast cancer     History reviewed. No pertinent surgical history.  Family History  Problem Relation Age of Onset   Diabetes Other        [place] Hx First degree relative   Breast cancer Mother 35   Breast cancer Maternal Aunt 36   Brain cancer Maternal Uncle 31   Colon cancer Neg Hx    Esophageal cancer Neg Hx  Stomach cancer Neg Hx    Rectal cancer Neg Hx     Social History   Tobacco Use   Smoking status: Never   Smokeless tobacco: Never  Substance Use Topics   Alcohol use: Yes    Alcohol/week: 2.0 standard drinks of alcohol    Types: 2 Standard drinks or equivalent per week    Comment: socially    Subjective:   Started on Monday with viral URI symptoms; + feeling better today;  Fever was 102 on Tuesday and has been improving since that time; today temp was 99.7- is on Tylenol; no chest pain or shortness of breath;     Objective:  Vitals:   02/09/22 1116  BP: 98/70  Pulse: (!) 118  Resp: 20  Temp: 98.5 F (36.9 C)  SpO2: 98%  Weight: 126 lb (57.2 kg)  Height: '5\' 7"'$   (1.702 m)    General: Well developed, well nourished, in no acute distress  Skin : Warm and dry.  Head: Normocephalic and atraumatic  Eyes: Sclera and conjunctiva clear; pupils round and reactive to light; extraocular movements intact  Ears: External normal; canals clear; tympanic membranes normal  Oropharynx: Pink, supple. No suspicious lesions  Neck: Supple without thyromegaly, adenopathy  Lungs: Respirations unlabored; clear to auscultation bilaterally without wheeze, rales, rhonchi  CVS exam: normal rate and regular rhythm.  Neurologic: Alert and oriented; speech intact; face symmetrical; moves all extremities well; CNII-XII intact without focal deficit   Assessment:  1. Viral URI   2. Tachycardia     Plan:  Per patient, she is feeling better today; physical exam and patient's appearance is reassuring; recommend rest, fluids and plan to return to work next Monday;  She will also plan to talk to her PCP at [facility] on Monday about episodes of elevated heart rate- will need to consider seeing cardiology; she wants to discuss options with her PCP.   No follow-ups on file.  No orders of the defined types were placed in this encounter.   Requested Prescriptions    No prescriptions requested or ordered in this encounter

## 2022-02-10 ENCOUNTER — Other Ambulatory Visit (HOSPITAL_COMMUNITY): Payer: Self-pay

## 2022-02-10 ENCOUNTER — Other Ambulatory Visit: Payer: Self-pay | Admitting: Family Medicine

## 2022-02-10 DIAGNOSIS — E782 Mixed hyperlipidemia: Secondary | ICD-10-CM

## 2022-02-10 MED ORDER — SIMVASTATIN 20 MG PO TABS
20.0000 mg | ORAL_TABLET | Freq: Every day | ORAL | 0 refills | Status: DC
  Filled 2022-02-10: qty 90, 90d supply, fill #0

## 2022-02-13 ENCOUNTER — Ambulatory Visit (INDEPENDENT_AMBULATORY_CARE_PROVIDER_SITE_OTHER): Payer: 59 | Admitting: Family Medicine

## 2022-02-13 VITALS — BP 100/64 | HR 93 | Temp 97.7°F | Resp 18 | Ht 67.0 in | Wt 131.0 lb

## 2022-02-13 DIAGNOSIS — E782 Mixed hyperlipidemia: Secondary | ICD-10-CM | POA: Diagnosis not present

## 2022-02-13 DIAGNOSIS — Z0000 Encounter for general adult medical examination without abnormal findings: Secondary | ICD-10-CM | POA: Diagnosis not present

## 2022-02-13 DIAGNOSIS — R509 Fever, unspecified: Secondary | ICD-10-CM

## 2022-02-13 DIAGNOSIS — E538 Deficiency of other specified B group vitamins: Secondary | ICD-10-CM | POA: Diagnosis not present

## 2022-02-13 DIAGNOSIS — E119 Type 2 diabetes mellitus without complications: Secondary | ICD-10-CM | POA: Diagnosis not present

## 2022-02-13 DIAGNOSIS — Z1329 Encounter for screening for other suspected endocrine disorder: Secondary | ICD-10-CM | POA: Diagnosis not present

## 2022-02-13 LAB — POC COVID19 BINAXNOW: SARS Coronavirus 2 Ag: POSITIVE — AB

## 2022-02-13 NOTE — Patient Instructions (Addendum)
Good to see you again today!   Colon due next year  Your covid test was positive - wear a mask for the rest of this week  Let me know if any other symptoms come up or if you get sick again

## 2022-02-14 ENCOUNTER — Encounter: Payer: Self-pay | Admitting: Family Medicine

## 2022-02-14 ENCOUNTER — Other Ambulatory Visit (HOSPITAL_COMMUNITY): Payer: Self-pay

## 2022-02-14 LAB — COMPREHENSIVE METABOLIC PANEL
ALT: 31 U/L (ref 0–35)
AST: 21 U/L (ref 0–37)
Albumin: 4.2 g/dL (ref 3.5–5.2)
Alkaline Phosphatase: 60 U/L (ref 39–117)
BUN: 17 mg/dL (ref 6–23)
CO2: 28 mEq/L (ref 19–32)
Calcium: 9.4 mg/dL (ref 8.4–10.5)
Chloride: 100 mEq/L (ref 96–112)
Creatinine, Ser: 0.76 mg/dL (ref 0.40–1.20)
GFR: 89.36 mL/min (ref >60.00)
Glucose, Bld: 135 mg/dL — ABNORMAL HIGH (ref 70–99)
Potassium: 3.9 mEq/L (ref 3.5–5.1)
Sodium: 138 mEq/L (ref 135–145)
Total Bilirubin: 0.5 mg/dL (ref 0.2–1.2)
Total Protein: 6.8 g/dL (ref 6.0–8.3)

## 2022-02-14 LAB — MICROALBUMIN / CREATININE URINE RATIO
Creatinine,U: 116.5 mg/dL
Microalb Creat Ratio: 0.6 mg/g (ref 0.0–30.0)
Microalb, Ur: <0.7 mg/dL (ref 0.0–1.9)

## 2022-02-14 LAB — LIPID PANEL
Cholesterol: 169 mg/dL (ref 0–200)
HDL: 61.1 mg/dL (ref >39.00)
LDL Cholesterol: 73 mg/dL (ref 0–99)
NonHDL: 108.17
Total CHOL/HDL Ratio: 3
Triglycerides: 178 mg/dL — ABNORMAL HIGH (ref 0.0–149.0)
VLDL: 35.6 mg/dL (ref 0.0–40.0)

## 2022-02-14 LAB — CBC
HCT: 39.2 % (ref 36.0–46.0)
Hemoglobin: 13.2 g/dL (ref 12.0–15.0)
MCHC: 33.7 g/dL (ref 30.0–36.0)
MCV: 91.5 fl (ref 78.0–100.0)
Platelets: 219 10*3/uL (ref 150.0–400.0)
RBC: 4.29 Mil/uL (ref 3.87–5.11)
RDW: 13 % (ref 11.5–15.5)
WBC: 5.2 10*3/uL (ref 4.0–10.5)

## 2022-02-14 LAB — VITAMIN B12: Vitamin B-12: 1034 pg/mL — ABNORMAL HIGH (ref 211–911)

## 2022-02-14 LAB — TSH: TSH: 1.45 u[IU]/mL (ref 0.35–5.50)

## 2022-02-20 ENCOUNTER — Other Ambulatory Visit (HOSPITAL_COMMUNITY): Payer: Self-pay

## 2022-02-20 ENCOUNTER — Other Ambulatory Visit: Payer: Self-pay | Admitting: Internal Medicine

## 2022-02-20 DIAGNOSIS — E1165 Type 2 diabetes mellitus with hyperglycemia: Secondary | ICD-10-CM

## 2022-02-20 MED ORDER — MOUNJARO 10 MG/0.5ML ~~LOC~~ SOAJ
10.0000 mg | SUBCUTANEOUS | 1 refills | Status: DC
  Filled 2022-02-20: qty 2, 28d supply, fill #0
  Filled 2022-03-15: qty 2, 28d supply, fill #1

## 2022-02-21 ENCOUNTER — Other Ambulatory Visit (HOSPITAL_COMMUNITY): Payer: Self-pay

## 2022-02-23 ENCOUNTER — Other Ambulatory Visit (HOSPITAL_COMMUNITY): Payer: Self-pay

## 2022-02-24 ENCOUNTER — Other Ambulatory Visit (HOSPITAL_COMMUNITY): Payer: Self-pay

## 2022-02-28 ENCOUNTER — Other Ambulatory Visit (HOSPITAL_COMMUNITY): Payer: Self-pay

## 2022-03-15 ENCOUNTER — Other Ambulatory Visit (HOSPITAL_COMMUNITY): Payer: Self-pay

## 2022-03-16 ENCOUNTER — Other Ambulatory Visit (HOSPITAL_COMMUNITY): Payer: Self-pay

## 2022-03-17 ENCOUNTER — Other Ambulatory Visit (HOSPITAL_COMMUNITY): Payer: Self-pay

## 2022-03-20 ENCOUNTER — Other Ambulatory Visit: Payer: Self-pay | Admitting: Internal Medicine

## 2022-03-20 ENCOUNTER — Other Ambulatory Visit (HOSPITAL_COMMUNITY): Payer: Self-pay

## 2022-03-21 ENCOUNTER — Other Ambulatory Visit (HOSPITAL_COMMUNITY): Payer: Self-pay

## 2022-03-21 MED ORDER — EMPAGLIFLOZIN 25 MG PO TABS
25.0000 mg | ORAL_TABLET | Freq: Every day | ORAL | 3 refills | Status: DC
  Filled 2022-03-21: qty 90, 90d supply, fill #0
  Filled 2022-06-26: qty 90, 90d supply, fill #1
  Filled 2022-09-25: qty 90, 90d supply, fill #2
  Filled 2022-12-24: qty 90, 90d supply, fill #3

## 2022-03-24 ENCOUNTER — Other Ambulatory Visit (HOSPITAL_COMMUNITY): Payer: Self-pay

## 2022-03-24 ENCOUNTER — Other Ambulatory Visit: Payer: Self-pay | Admitting: Internal Medicine

## 2022-03-27 ENCOUNTER — Other Ambulatory Visit (HOSPITAL_COMMUNITY): Payer: Self-pay

## 2022-03-27 MED ORDER — UNIFINE PENTIPS 32G X 4 MM MISC
Freq: Every day | 3 refills | Status: AC
  Filled 2022-03-27: qty 100, 33d supply, fill #0
  Filled 2022-05-15: qty 100, 33d supply, fill #1

## 2022-03-28 ENCOUNTER — Other Ambulatory Visit (HOSPITAL_COMMUNITY): Payer: Self-pay

## 2022-03-30 ENCOUNTER — Other Ambulatory Visit (HOSPITAL_COMMUNITY): Payer: Self-pay

## 2022-03-30 ENCOUNTER — Other Ambulatory Visit: Payer: Self-pay | Admitting: Family Medicine

## 2022-03-30 DIAGNOSIS — Z0000 Encounter for general adult medical examination without abnormal findings: Secondary | ICD-10-CM

## 2022-03-30 MED ORDER — ALYACEN 1/35 1-35 MG-MCG PO TABS
1.0000 | ORAL_TABLET | Freq: Every day | ORAL | 3 refills | Status: DC
  Filled 2022-03-30: qty 84, 84d supply, fill #0
  Filled 2022-06-22: qty 84, 84d supply, fill #1
  Filled 2022-09-12: qty 84, 84d supply, fill #2
  Filled 2022-11-27: qty 84, 84d supply, fill #3

## 2022-04-12 ENCOUNTER — Other Ambulatory Visit: Payer: Self-pay | Admitting: Internal Medicine

## 2022-04-12 ENCOUNTER — Other Ambulatory Visit (HOSPITAL_COMMUNITY): Payer: Self-pay

## 2022-04-12 ENCOUNTER — Other Ambulatory Visit: Payer: Self-pay | Admitting: Family Medicine

## 2022-04-12 DIAGNOSIS — E1165 Type 2 diabetes mellitus with hyperglycemia: Secondary | ICD-10-CM

## 2022-04-12 MED ORDER — MOUNJARO 10 MG/0.5ML ~~LOC~~ SOAJ
10.0000 mg | SUBCUTANEOUS | 1 refills | Status: DC
  Filled 2022-04-12: qty 2, 28d supply, fill #0
  Filled 2022-05-15 (×2): qty 2, 28d supply, fill #1

## 2022-04-14 ENCOUNTER — Other Ambulatory Visit: Payer: Self-pay

## 2022-04-14 ENCOUNTER — Other Ambulatory Visit: Payer: Self-pay | Admitting: Family Medicine

## 2022-04-14 ENCOUNTER — Other Ambulatory Visit (HOSPITAL_COMMUNITY): Payer: Self-pay

## 2022-04-14 DIAGNOSIS — B009 Herpesviral infection, unspecified: Secondary | ICD-10-CM

## 2022-04-14 MED ORDER — ACYCLOVIR 400 MG PO TABS
800.0000 mg | ORAL_TABLET | Freq: Two times a day (BID) | ORAL | 2 refills | Status: DC | PRN
  Filled 2022-04-14 – 2022-04-28 (×3): qty 50, 13d supply, fill #0
  Filled 2022-04-28 – 2022-08-10 (×2): qty 50, 13d supply, fill #1

## 2022-04-16 ENCOUNTER — Encounter: Payer: Self-pay | Admitting: Family Medicine

## 2022-04-16 ENCOUNTER — Other Ambulatory Visit: Payer: Self-pay | Admitting: Internal Medicine

## 2022-04-16 DIAGNOSIS — E1165 Type 2 diabetes mellitus with hyperglycemia: Secondary | ICD-10-CM

## 2022-04-17 ENCOUNTER — Other Ambulatory Visit (HOSPITAL_COMMUNITY): Payer: Self-pay

## 2022-04-17 MED ORDER — DEXCOM G6 SENSOR MISC
3 refills | Status: DC
  Filled 2022-04-17: qty 9, 90d supply, fill #0
  Filled 2022-05-22: qty 3, 30d supply, fill #0

## 2022-04-18 ENCOUNTER — Ambulatory Visit: Payer: 59 | Admitting: Family Medicine

## 2022-04-18 ENCOUNTER — Telehealth: Payer: Self-pay

## 2022-04-18 ENCOUNTER — Encounter: Payer: Self-pay | Admitting: Family Medicine

## 2022-04-18 VITALS — BP 110/76 | HR 84 | Temp 98.3°F | Ht 67.0 in | Wt 134.0 lb

## 2022-04-18 DIAGNOSIS — S46819A Strain of other muscles, fascia and tendons at shoulder and upper arm level, unspecified arm, initial encounter: Secondary | ICD-10-CM

## 2022-04-18 DIAGNOSIS — S46811A Strain of other muscles, fascia and tendons at shoulder and upper arm level, right arm, initial encounter: Secondary | ICD-10-CM

## 2022-04-18 DIAGNOSIS — S46812A Strain of other muscles, fascia and tendons at shoulder and upper arm level, left arm, initial encounter: Secondary | ICD-10-CM
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
## 2022-04-18 NOTE — Telephone Encounter (Signed)
Pt is being seen today at 3pm by dr wendling.

## 2022-04-18 NOTE — Patient Instructions (Signed)
Heat (pad or rice pillow in microwave) over affected area, 10-15 minutes twice daily.   Ice/cold pack over area for 10-15 min twice daily.  OK to take Tylenol 1000 mg (2 extra strength tabs) or 975 mg (3 regular strength tabs) every 6 hours as needed.  Ibuprofen 400-600 mg (2-3 over the counter strength tabs) every 6 hours as needed for pain.  Send me a message in 3-4 weeks if no improvement.   Let us know if you need anything.  Trapezius stretches/exercises Do exercises exactly as told by your health care provider and adjust them as directed. It is normal to feel mild stretching, pulling, tightness, or discomfort as you do these exercises, but you should stop right away if you feel sudden pain or your pain gets worse.   Stretching and range of motion exercises These exercises warm up your muscles and joints and improve the movement and flexibility of your shoulder. These exercises can also help to relieve pain, numbness, and tingling. If you are unable to do any of the following for any reason, do not further attempt to do it.   Exercise A: Flexion, standing     Stand and hold a broomstick, a cane, or a similar object. Place your hands a little more than shoulder-width apart on the object. Your left / right hand should be palm-up, and your other hand should be palm-down. Push the stick to raise your left / right arm out to your side and then over your head. Use your other hand to help move the stick. Stop when you feel a stretch in your shoulder, or when you reach the angle that is recommended by your health care provider. Avoid shrugging your shoulder while you raise your arm. Keep your shoulder blade tucked down toward your spine. Hold for 30 seconds. Slowly return to the starting position. Repeat 2 times. Complete this exercise 3 times per week.  Exercise B: Abduction, supine     Lie on your back and hold a broomstick, a cane, or a similar object. Place your hands a little more  than shoulder-width apart on the object. Your left / right hand should be palm-up, and your other hand should be palm-down. Push the stick to raise your left / right arm out to your side and then over your head. Use your other hand to help move the stick. Stop when you feel a stretch in your shoulder, or when you reach the angle that is recommended by your health care provider. Avoid shrugging your shoulder while you raise your arm. Keep your shoulder blade tucked down toward your spine. Hold for 30 seconds. Slowly return to the starting position. Repeat 2 times. Complete this exercise 3 times per week.  Exercise C: Flexion, active-assisted     Lie on your back. You may bend your knees for comfort. Hold a broomstick, a cane, or a similar object. Place your hands about shoulder-width apart on the object. Your palms should face toward your feet. Raise the stick and move your arms over your head and behind your head, toward the floor. Use your healthy arm to help your left / right arm move farther. Stop when you feel a gentle stretch in your shoulder, or when you reach the angle where your health care provider tells you to stop. Hold for 30 seconds. Slowly return to the starting position. Repeat 2 times. Complete this exercise 3 times per week.  Exercise D: External rotation and abduction     Stand  in a door frame with one of your feet slightly in front of the other. This is called a staggered stance. Choose one of the following positions as told by your health care provider: Place your hands and forearms on the door frame above your head. Place your hands and forearms on the door frame at the height of your head. Place your hands on the door frame at the height of your elbows. Slowly move your weight onto your front foot until you feel a stretch across your chest and in the front of your shoulders. Keep your head and chest upright and keep your abdominal muscles tight. Hold for 30  seconds. To release the stretch, shift your weight to your back foot. Repeat 2 times. Complete this stretch 3 times per week.  Strengthening exercises These exercises build strength and endurance in your shoulder. Endurance is the ability to use your muscles for a long time, even after your muscles get tired. Exercise E: Scapular depression and adduction  Sit on a stable chair. Support your arms in front of you with pillows, armrests, or a tabletop. Keep your elbows in line with the sides of your body. Gently move your shoulder blades down toward your middle back. Relax the muscles on the tops of your shoulders and in the back of your neck. Hold for 3 seconds. Slowly release the tension and relax your muscles completely before doing this exercise again. Repeat for a total of 10 repetitions. After you have practiced this exercise, try doing the exercise without the arm support. Then, try the exercise while standing instead of sitting. Repeat 2 times. Complete this exercise 3 times per week.  Exercise F: Shoulder abduction, isometric     Stand or sit about 4-6 inches (10-15 cm) from a wall with your left / right side facing the wall. Bend your left / right elbow and gently press your elbow against the wall. Increase the pressure slowly until you are pressing as hard as you can without shrugging your shoulder. Hold for 3 seconds. Slowly release the tension and relax your muscles completely. Repeat for a total of 10 repetitions. Repeat 2 times. Complete this exercise 3 times per week.  Exercise G: Shoulder flexion, isometric     Stand or sit about 4-6 inches (10-15 cm) away from a wall with your left / right side facing the wall. Keep your left / right elbow straight and gently press the top of your fist against the wall. Increase the pressure slowly until you are pressing as hard as you can without shrugging your shoulder. Hold for 10-15 seconds. Slowly release the tension and relax your  muscles completely. Repeat for a total of 10 repetitions. Repeat 2 times. Complete this exercise 3 times per week.  Exercise H: Internal rotation     Sit in a stable chair without armrests, or stand. Secure an exercise band at your left / right side, at elbow height. Place a soft object, such as a folded towel or a small pillow, under your left / right upper arm so your elbow is a few inches (about 8 cm) away from your side. Hold the end of the exercise band so the band stretches. Keeping your elbow pressed against the soft object under your arm, move your forearm across your body toward your abdomen. Keep your body steady so the movement is only coming from your shoulder. Hold for 3 seconds. Slowly return to the starting position. Repeat for a total of 10 repetitions. Repeat  2 times. Complete this exercise 3 times per week.  Exercise I: External rotation     Sit in a stable chair without armrests, or stand. Secure an exercise band at your left / right side, at elbow height. Place a soft object, such as a folded towel or a small pillow, under your left / right upper arm so your elbow is a few inches (about 8 cm) away from your side. Hold the end of the exercise band so the band stretches. Keeping your elbow pressed against the soft object under your arm, move your forearm out, away from your abdomen. Keep your body steady so the movement is only coming from your shoulder. Hold for 3 seconds. Slowly return to the starting position. Repeat for a total of 10 repetitions. Repeat 2 times. Complete this exercise 3 times per week. Exercise J: Shoulder extension  Sit in a stable chair without armrests, or stand. Secure an exercise band to a stable object in front of you so the band is at shoulder height. Hold one end of the exercise band in each hand. Your palms should face each other. Straighten your elbows and lift your hands up to shoulder height. Step back, away from the secured end of the  exercise band, until the band stretches. Squeeze your shoulder blades together and pull your hands down to the sides of your thighs. Stop when your hands are straight down by your sides. Do not let your hands go behind your body. Hold for 3 seconds. Slowly return to the starting position. Repeat for a total of 10 repetitions. Repeat 2 times. Complete this exercise 3 times per week.  Exercise K: Shoulder extension, prone     Lie on your abdomen on a firm surface so your left / right arm hangs over the edge. Hold a 5 lb weight in your hand so your palm faces in toward your body. Your arm should be straight. Squeeze your shoulder blade down toward the middle of your back. Slowly raise your arm behind you, up to the height of the surface that you are lying on. Keep your arm straight. Hold for 3 seconds. Slowly return to the starting position and relax your muscles. Repeat for a total of 10 repetitions. Repeat 2 times. Complete this exercise 3 times per week.   Exercise L: Horizontal abduction, prone  Lie on your abdomen on a firm surface so your left / right arm hangs over the edge. Hold a 5 lb weight in your hand so your palm faces toward your feet. Your arm should be straight. Squeeze your shoulder blade down toward the middle of your back. Bend your elbow so your hand moves up, until your elbow is bent to an "L" shape (90 degrees). With your elbow bent, slowly move your forearm forward and up. Raise your hand up to the height of the surface that you are lying on. Your upper arm should not move, and your elbow should stay bent. At the top of the movement, your palm should face the floor. Hold for 3 seconds. Slowly return to the starting position and relax your muscles. Repeat for a total of 10 repetitions. Repeat 2 times. Complete this exercise 3 times per week.  Exercise M: Horizontal abduction, standing  Sit on a stable chair, or stand. Secure an exercise band to a stable object in  front of you so the band is at shoulder height. Hold one end of the exercise band in each hand. Straighten your elbows and  lift your hands straight in front of you, up to shoulder height. Your palms should face down, toward the floor. Step back, away from the secured end of the exercise band, until the band stretches. Move your arms out to your sides, and keep your arms straight. Hold for 3 seconds. Slowly return to the starting position. Repeat for a total of 10 repetitions. Repeat 2 times. Complete this exercise 3 times per week.  Exercise N: Scapular retraction and elevation  Sit on a stable chair, or stand. Secure an exercise band to a stable object in front of you so the band is at shoulder height. Hold one end of the exercise band in each hand. Your palms should face each other. Sit in a stable chair without armrests, or stand. Step back, away from the secured end of the exercise band, until the band stretches. Squeeze your shoulder blades together and lift your hands over your head. Keep your elbows straight. Hold for 3 seconds. Slowly return to the starting position. Repeat for a total of 10 repetitions. Repeat 2 times. Complete this exercise 3 times per week.  This information is not intended to replace advice given to you by your health care provider. Make sure you discuss any questions you have with your health care provider. Document Released: 06/26/2005 Document Revised: 03/02/2016 Document Reviewed: 05/13/2015 Elsevier Interactive Patient Education  2017 [employer].

## 2022-04-19 ENCOUNTER — Other Ambulatory Visit (HOSPITAL_COMMUNITY): Payer: Self-pay

## 2022-04-28 ENCOUNTER — Other Ambulatory Visit (HOSPITAL_COMMUNITY): Payer: Self-pay

## 2022-04-30 ENCOUNTER — Encounter: Payer: Self-pay | Admitting: Family Medicine

## 2022-04-30 DIAGNOSIS — M546 Pain in thoracic spine: Secondary | ICD-10-CM

## 2022-04-30 DIAGNOSIS — M542 Cervicalgia: Secondary | ICD-10-CM

## 2022-05-01 ENCOUNTER — Encounter: Payer: Self-pay | Admitting: Family Medicine

## 2022-05-01 ENCOUNTER — Ambulatory Visit
Admission: RE | Admit: 2022-05-01 | Discharge: 2022-05-01 | Disposition: A | Discharge status: Home / Self Care | Payer: 59 | Source: Ambulatory Visit | Attending: Family Medicine | Admitting: Family Medicine

## 2022-05-01 ENCOUNTER — Other Ambulatory Visit: Payer: Self-pay | Admitting: Family Medicine

## 2022-05-01 DIAGNOSIS — M25512 Pain in left shoulder: Secondary | ICD-10-CM | POA: Diagnosis not present

## 2022-05-01 DIAGNOSIS — M50323 Other cervical disc degeneration at C6-C7 level: Secondary | ICD-10-CM | POA: Diagnosis not present

## 2022-05-01 DIAGNOSIS — M546 Pain in thoracic spine: Secondary | ICD-10-CM

## 2022-05-01 DIAGNOSIS — M542 Cervicalgia: Secondary | ICD-10-CM

## 2022-05-02 NOTE — Patient Instructions (Signed)
It was good to see you today Please let me know if you are not feeling better soon
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
## 2022-05-08 ENCOUNTER — Ambulatory Visit: Payer: 59 | Admitting: Family Medicine

## 2022-05-08 ENCOUNTER — Other Ambulatory Visit (HOSPITAL_COMMUNITY): Payer: Self-pay

## 2022-05-08 VITALS — BP 112/72 | HR 80 | Temp 98.0°F | Resp 18 | Ht 67.0 in | Wt 131.0 lb

## 2022-05-08 DIAGNOSIS — S29019A Strain of muscle and tendon of unspecified wall of thorax, initial encounter: Secondary | ICD-10-CM

## 2022-05-08 MED ORDER — METHOCARBAMOL 500 MG PO TABS
500.0000 mg | ORAL_TABLET | Freq: Four times a day (QID) | ORAL | 0 refills | Status: DC | PRN
  Filled 2022-05-08: qty 30, 8d supply, fill #0

## 2022-05-15 ENCOUNTER — Other Ambulatory Visit (HOSPITAL_COMMUNITY): Payer: Self-pay

## 2022-05-15 ENCOUNTER — Other Ambulatory Visit: Payer: Self-pay | Admitting: Family Medicine

## 2022-05-15 DIAGNOSIS — E782 Mixed hyperlipidemia: Secondary | ICD-10-CM

## 2022-05-15 MED ORDER — SIMVASTATIN 20 MG PO TABS
20.0000 mg | ORAL_TABLET | Freq: Every day | ORAL | 0 refills | Status: DC
  Filled 2022-05-15: qty 90, 90d supply, fill #0

## 2022-05-18 ENCOUNTER — Other Ambulatory Visit (HOSPITAL_COMMUNITY): Payer: Self-pay

## 2022-05-20 ENCOUNTER — Other Ambulatory Visit: Payer: Self-pay

## 2022-05-20 ENCOUNTER — Ambulatory Visit: Payer: 59 | Attending: Family Medicine

## 2022-05-20 DIAGNOSIS — S29019A Strain of muscle and tendon of unspecified wall of thorax, initial encounter: Secondary | ICD-10-CM | POA: Insufficient documentation

## 2022-05-20 DIAGNOSIS — R293 Abnormal posture: Secondary | ICD-10-CM | POA: Diagnosis not present

## 2022-05-20 DIAGNOSIS — M546 Pain in thoracic spine: Secondary | ICD-10-CM | POA: Insufficient documentation

## 2022-05-20 DIAGNOSIS — M6281 Muscle weakness (generalized): Secondary | ICD-10-CM | POA: Diagnosis not present
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
## 2022-05-22 ENCOUNTER — Ambulatory Visit: Payer: 59

## 2022-05-22 ENCOUNTER — Other Ambulatory Visit (HOSPITAL_COMMUNITY): Payer: Self-pay

## 2022-05-22 DIAGNOSIS — M6281 Muscle weakness (generalized): Secondary | ICD-10-CM

## 2022-05-22 DIAGNOSIS — S29019A Strain of muscle and tendon of unspecified wall of thorax, initial encounter: Secondary | ICD-10-CM | POA: Diagnosis not present

## 2022-05-22 DIAGNOSIS — R293 Abnormal posture: Secondary | ICD-10-CM | POA: Diagnosis not present

## 2022-05-22 DIAGNOSIS — M546 Pain in thoracic spine: Secondary | ICD-10-CM | POA: Diagnosis not present
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
## 2022-05-24 ENCOUNTER — Other Ambulatory Visit (HOSPITAL_COMMUNITY): Payer: Self-pay
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
## 2022-05-25 ENCOUNTER — Ambulatory Visit: Payer: 59 | Admitting: Physical Therapy

## 2022-05-25 ENCOUNTER — Encounter: Payer: Self-pay | Admitting: Family Medicine

## 2022-05-25 ENCOUNTER — Encounter: Payer: Self-pay | Admitting: Physical Therapy

## 2022-05-25 DIAGNOSIS — S29019A Strain of muscle and tendon of unspecified wall of thorax, initial encounter: Secondary | ICD-10-CM | POA: Diagnosis not present

## 2022-05-25 DIAGNOSIS — M6281 Muscle weakness (generalized): Secondary | ICD-10-CM | POA: Diagnosis not present

## 2022-05-25 DIAGNOSIS — R293 Abnormal posture: Secondary | ICD-10-CM

## 2022-05-25 DIAGNOSIS — M546 Pain in thoracic spine: Secondary | ICD-10-CM

## 2022-05-29 ENCOUNTER — Ambulatory Visit: Payer: 59

## 2022-05-29 DIAGNOSIS — R293 Abnormal posture: Secondary | ICD-10-CM

## 2022-05-29 DIAGNOSIS — M6281 Muscle weakness (generalized): Secondary | ICD-10-CM

## 2022-05-29 DIAGNOSIS — S29019A Strain of muscle and tendon of unspecified wall of thorax, initial encounter: Secondary | ICD-10-CM | POA: Diagnosis not present

## 2022-05-29 DIAGNOSIS — M546 Pain in thoracic spine: Secondary | ICD-10-CM | POA: Diagnosis not present
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
## 2022-05-30 ENCOUNTER — Ambulatory Visit: Payer: 59

## 2022-05-30 DIAGNOSIS — R293 Abnormal posture: Secondary | ICD-10-CM | POA: Diagnosis not present

## 2022-05-30 DIAGNOSIS — M546 Pain in thoracic spine: Secondary | ICD-10-CM | POA: Diagnosis not present

## 2022-05-30 DIAGNOSIS — M6281 Muscle weakness (generalized): Secondary | ICD-10-CM | POA: Diagnosis not present

## 2022-05-30 DIAGNOSIS — S29019A Strain of muscle and tendon of unspecified wall of thorax, initial encounter: Secondary | ICD-10-CM | POA: Diagnosis not present
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
## 2022-06-05 ENCOUNTER — Ambulatory Visit: Payer: 59

## 2022-06-05 DIAGNOSIS — M6281 Muscle weakness (generalized): Secondary | ICD-10-CM

## 2022-06-05 DIAGNOSIS — M546 Pain in thoracic spine: Secondary | ICD-10-CM | POA: Diagnosis not present

## 2022-06-05 DIAGNOSIS — R293 Abnormal posture: Secondary | ICD-10-CM

## 2022-06-05 DIAGNOSIS — S29019A Strain of muscle and tendon of unspecified wall of thorax, initial encounter: Secondary | ICD-10-CM | POA: Diagnosis not present
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
## 2022-06-06 ENCOUNTER — Other Ambulatory Visit (HOSPITAL_COMMUNITY): Payer: Self-pay

## 2022-06-08 ENCOUNTER — Ambulatory Visit: Payer: 59

## 2022-06-08 DIAGNOSIS — S29019A Strain of muscle and tendon of unspecified wall of thorax, initial encounter: Secondary | ICD-10-CM | POA: Diagnosis not present

## 2022-06-08 DIAGNOSIS — M6281 Muscle weakness (generalized): Secondary | ICD-10-CM

## 2022-06-08 DIAGNOSIS — R293 Abnormal posture: Secondary | ICD-10-CM

## 2022-06-08 DIAGNOSIS — M546 Pain in thoracic spine: Secondary | ICD-10-CM
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
## 2022-06-12 ENCOUNTER — Other Ambulatory Visit: Payer: Self-pay | Admitting: Internal Medicine

## 2022-06-12 DIAGNOSIS — E1165 Type 2 diabetes mellitus with hyperglycemia: Secondary | ICD-10-CM

## 2022-06-15 ENCOUNTER — Encounter: Payer: Self-pay | Admitting: Internal Medicine

## 2022-06-15 ENCOUNTER — Ambulatory Visit (INDEPENDENT_AMBULATORY_CARE_PROVIDER_SITE_OTHER): Payer: 59 | Admitting: Internal Medicine

## 2022-06-15 ENCOUNTER — Other Ambulatory Visit (HOSPITAL_COMMUNITY): Payer: Self-pay

## 2022-06-15 ENCOUNTER — Ambulatory Visit: Payer: 59

## 2022-06-15 VITALS — BP 108/62 | HR 88 | Ht 67.0 in | Wt 135.4 lb

## 2022-06-15 DIAGNOSIS — E538 Deficiency of other specified B group vitamins: Secondary | ICD-10-CM | POA: Diagnosis not present

## 2022-06-15 DIAGNOSIS — E782 Mixed hyperlipidemia: Secondary | ICD-10-CM | POA: Diagnosis not present

## 2022-06-15 DIAGNOSIS — E1165 Type 2 diabetes mellitus with hyperglycemia: Secondary | ICD-10-CM

## 2022-06-15 LAB — POCT GLYCOSYLATED HEMOGLOBIN (HGB A1C): Hemoglobin A1C: 6.6 % — AB (ref 4.0–5.6)

## 2022-06-15 MED ORDER — MOUNJARO 10 MG/0.5ML ~~LOC~~ SOAJ
10.0000 mg | SUBCUTANEOUS | 3 refills | Status: DC
  Filled 2023-01-15: qty 2, 28d supply, fill #0
  Filled 2023-03-13 (×2): qty 2, 28d supply, fill #1

## 2022-06-15 MED ORDER — DEXCOM G7 SENSOR MISC
3 refills | Status: DC
  Filled 2022-06-15: qty 3, 30d supply, fill #0
  Filled 2022-07-27: qty 3, 30d supply, fill #1
  Filled 2022-08-22: qty 3, 30d supply, fill #2
  Filled 2022-09-21: qty 3, 30d supply, fill #3
  Filled 2022-10-21: qty 3, 30d supply, fill #4
  Filled 2022-11-27: qty 3, 30d supply, fill #5
  Filled 2022-12-25: qty 3, 30d supply, fill #6
  Filled 2023-01-31: qty 3, 30d supply, fill #7
  Filled 2023-03-07: qty 3, 30d supply, fill #8
  Filled 2023-04-08: qty 3, 30d supply, fill #9
  Filled 2023-05-01: qty 3, 30d supply, fill #10
  Filled 2023-06-08: qty 3, 30d supply, fill #11

## 2022-06-15 MED ORDER — MOUNJARO 10 MG/0.5ML ~~LOC~~ SOAJ
10.0000 mg | SUBCUTANEOUS | 1 refills | Status: DC
  Filled 2022-06-15: qty 2, 28d supply, fill #0
  Filled 2022-07-09: qty 2, 28d supply, fill #1

## 2022-06-15 NOTE — Progress Notes (Signed)
Patient ID: [name] Young, female   DOB: September 25, 1970, 51 y.o.   MRN: [id number]  HPI: [name] Young is a 51 y.o.-year-old female, presenting for f/u for DM2, dx in 2008 (GDM 1998), insulin-dependent, uncontrolled, without long-term complications. Last visit 6 months ago.  Interim history: No increased urination, blurry vision, nausea, chest pain.  DM2: Reviewed HbA1c levels: Lab Results  Component Value Date   HGBA1C 6.3 (A) 12/08/2021   HGBA1C 7.1 (A) 08/05/2021   HGBA1C 6.9 (A) 05/03/2021   HGBA1C 8.9 (H) 12/29/2020   HGBA1C 7.3 (A) 09/28/2020   HGBA1C 7.0 (A) 05/31/2020   HGBA1C 7.5 (H) 01/05/2020   HGBA1C 6.8 (A) 06/04/2019   HGBA1C 7.8 (H) 02/28/2019   HGBA1C 7.6 (A) 10/22/2018   HGBA1C 6.9 (A) 03/22/2018   HGBA1C 6.7 11/19/2017   HGBA1C 6.7 08/21/2017   HGBA1C 6.5 04/20/2017   HGBA1C 6.7 01/19/2017   HGBA1C 6.4 09/07/2016   HGBA1C 6.6 (H) 03/16/2016   HGBA1C 6.5 03/16/2016   HGBA1C 6.5 12/13/2015   HGBA1C 6.4 09/13/2015   HGBA1C 7.6 06/14/2015   HGBA1C 6.8 (H) 03/22/2015   HGBA1C 6.5 03/12/2015   HGBA1C 7.0 (H) 12/10/2014   HGBA1C 7.6 (H) 08/26/2014   HGBA1C 7.5 (H) 05/15/2014   HGBA1C 7.3 (H) 02/09/2014   HGBA1C 7.1 (H) 01/28/2013   HGBA1C 6.9 (H) 08/02/2012   HGBA1C 6.6 (H) 01/25/2012    Since she was dx with DM >> she lost 50 lbs and was able to come off Lantus.  She is on: - Metformin 2000 mg with dinner - Jardiance 25 mg daily - Lantus 8 units at bedtime - added 07/2021 >> with dinner - Lyumjev 10 units before dinner - started 09/2020 >> Lyumjev 8  >> 8-10 units before dinner - Mounjaro 10 mg weekly She was on Trulicity 1.5 mg weekly -increased 10/2018 >> Ozempic 1 mg prev. She was on Lantus before. She was on Glipizide.  Pt checks her sugars >4 times a day now with a Dexcom CGM:   Previously:   Previously:   Lowest sugar was 49 (alcohol) >> ...  58 (alcohol) >> 70 >> 60s >> ; she has hypoglycemia awareness in the 60s Highest sugar was   270s >> 200s >> 317.  Glucometer: [employer] >> [employer]  Pt's meals are: - Breakfast: Eggs or oatmeal >> bacon and eggs - Lunch: Salad or veggies or tuna salad with saltines or chicken tenders - Dinner: Fast food -take out - Snacks: Graham crackers  -No CKD, last BUN/creatinine:  Lab Results  Component Value Date   BUN 17 02/13/2022   CREATININE 0.76 02/13/2022   ACR normal.  Previously on lisinopril but now off. Lab Results  Component Value Date   MICRALBCREAT 0.6 02/13/2022   MICRALBCREAT 1.8 09/28/2020   MICRALBCREAT 1.7 01/05/2020   MICRALBCREAT 0.8 10/22/2018   MICRALBCREAT 0.6 04/20/2017   MICRALBCREAT 0.7 03/16/2016   MICRALBCREAT 0.7 03/22/2015   MICRALBCREAT 0.7 10/27/2014   MICRALBCREAT 0.1 02/09/2014   MICRALBCREAT 0.1 01/28/2013   -+ HL; last set of lipids: Lab Results  Component Value Date   CHOL 169 02/13/2022   HDL 61.10 02/13/2022   LDLCALC 73 02/13/2022   LDLDIRECT 62.0 03/22/2015   TRIG 178.0 (H) 02/13/2022   CHOLHDL 3 02/13/2022  On Zocor 20.  - last eye exam was in 12/2021: reportedly No [facility].   -She has numbness and tingling in her feet.  Last foot exam August 05, 2021.  Vit  B12 def. -She continues on B complex and 1000 mcg B12 daily  Review vitamin B12 levels: Lab Results  Component Value Date   VITAMINB12 1,034 (H) 02/13/2022   VITAMINB12 963 (H) 09/28/2020   VITAMINB12 490 01/05/2020   VITAMINB12 1,216 (H) 02/28/2019   VITAMINB12 890 10/22/2018   VITAMINB12 909 12/05/2017   VITAMINB12 782 01/24/2017   VITAMINB12 969 (H) 03/16/2016   VITAMINB12 503 09/13/2015   VITAMINB12 923 (H) 03/12/2015   ROS: + see HPI  I reviewed pt's medications, allergies, PMH, social hx, family hx, and changes were documented in the history of present illness. Otherwise, unchanged from my initial visit note.  Past Medical History:  Diagnosis Date   Diabetes mellitus    Type II   Family history of breast cancer    Family history of  breast cancer    No past surgical history on file. 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 50 tablet 2   Continuous Blood Gluc Sensor (DEXCOM G7 SENSOR) MISC Use as directed 9 each 3   empagliflozin (JARDIANCE) 25 MG TABS tablet Take 1 tablet (25 mg total) by mouth daily. 90 tablet 3   fluticasone (FLONASE) 50 MCG/ACT nasal spray Place 1 spray into both nostrils at bedtime. 16 g 6   insulin glargine-yfgn (SEMGLEE) 100 UNIT/ML Pen Inject 8-10 Units into the skin daily. 15 mL 3   Insulin Lispro-aabc (LYUMJEV KWIKPEN) 100 UNIT/ML KwikPen Inject 6-8 Units into the skin 2 (two) times daily. 15 mL 3   Insulin Pen Needle (UNIFINE PENTIPS) 32G X 4 MM MISC Use once daily. 100 each 3   Lancets (FREESTYLE) lancets Use to check blood sugar 2 times a day. 200 each 12   metFORMIN (GLUCOPHAGE) 1000 MG tablet Take 2 tablets (2,000 mg total) by mouth daily with supper. 180 tablet 3   methocarbamol (ROBAXIN) 500 MG tablet Take 1 tablet (500 mg total) by mouth every 6 (six) hours as needed for muscle spasms. 30 tablet 0   Multiple Vitamins-Minerals (DIASENSE MULTIVITAMIN) TABS Take 1 tablet by mouth daily.     norethindrone-ethinyl estradiol 1/35 (ALAYCEN 1/35) tablet Take 1 tablet by mouth daily. 84 tablet 3   Omeprazole (PRILOSEC PO) Take by mouth as needed.      simvastatin (ZOCOR) 20 MG tablet Take 1 tablet (20 mg total) by mouth at bedtime. 90 tablet 0   terbinafine (LAMISIL) 250 MG tablet Take 1 tablet (250 mg total)  by mouth daily. Take for 12 weeks for toenail fungus 90 tablet 0   tirzepatide (MOUNJARO) 10 MG/0.5ML Pen Inject 10 mg into the skin once a week. 2 mL 1   tirzepatide (MOUNJARO) 12.5 MG/0.5ML Pen Inject 12.5 mg into the skin once a week. 2 mL 0   vitamin B-12 (CYANOCOBALAMIN) 1000 MCG tablet Take 1,000 mcg by mouth daily.      [DISCONTINUED] glipiZIDE (GLUCOTROL) 5 MG tablet Take  1 tablet (5 mg total) by mouth daily. 90 tablet 3   No current facility-administered medications on file prior to visit.   Allergies  Allergen Reactions   Penicillins     REACTION: RASH   Shingrix [Zoster Vac Recomb Adjuvanted]     Patient developed itching at site a couple of days after shot.  Decided not to administer second dose   Family History  Problem Relation Age of Onset   Diabetes Other        [place] Hx First degree relative   Breast cancer Mother 42   Breast cancer Maternal Aunt 22   Brain cancer Maternal Uncle 58   Colon cancer Neg Hx    Esophageal cancer Neg Hx    Stomach cancer Neg Hx    Rectal cancer Neg Hx    PE: BP 108/62 (BP Location: Right Arm, Patient Position: Sitting, Cuff Size: Normal)   Pulse 88   Ht 5' 7"  (1.702 m)   Wt 135 lb 6.4 oz (61.4 kg)   SpO2 98%   BMI 21.21 kg/m     Wt Readings from Last 3 Encounters:  06/15/22 135 lb 6.4 oz (61.4 kg)  05/08/22 131 lb (59.4 kg)  04/18/22 134 lb (60.8 kg)   Constitutional: normal weight, in NAD Eyes:  EOMI, no exophthalmos ENT: no neck masses, no cervical lymphadenopathy Cardiovascular: RRR, No MRG Respiratory: CTA B Musculoskeletal: no deformities Skin:no rashes Neurological: no tremor with outstretched hands  ASSESSMENT: 1. DM2, insulin-dependent, uncontrolled, without long term complications, but with hyperglycemia  2. B12 deficiency  3. HL  PLAN:  1. Patient with longstanding, uncontrolled, type 2 diabetes, on a complex medication regimen including metformin, SGLT2 inhibitor, weekly GIP/GLP-1 receptor agonist,  long-acting insulin, and now also ultra rapid acting insulin before dinner, with improving control at last visit.  At that time, HbA1c was 6.3%, the best in a long time.  I advised her to adjust the dose of Lyumjev with dinner depending on the size of her meals, but we did not make any other changes, since the majority of her blood sugars were at goal. -We could consider a mechanical pump (for example CeQur) or regular insulin pump for her in the future. CGM interpretation: -At today's visit, we reviewed her CGM downloads: It appears that 68% of values are in target range (goal >70%), while 32% are higher than 180 (goal <25%), and 0% are lower than 70 (goal <4%).  The calculated average blood sugar is 162.  The projected HbA1c for the next 3 months (GMI) is 7.2%. -Reviewing the CGM trends, sugars appear to be higher in the last 2 weeks compared to the previous 2 weeks, especially in the first half of the day.  Upon questioning, she is eating occasionally fast food in the morning, which does increase her blood sugars.  Also, sugars are slightly higher after lunch and more consistently elevated after dinner.  Upon questioning, she did not increase the dose of Lyumjev before larger meals, as advised at last visit. -As she occasionally had some lower blood sugars overnight, we discussed about possibly moving the Semglee to the morning.  This may also help with the blood sugars throughout the day.  I also advised her to increase the Lyumjev as needed before dinner.  Will continue the rest of the regimen for now - I suggested to:  Patient Instructions  Please continue: - Metformin 2000 mg with dinner - Jardiance 25 mg daily - Mounjaro 10 mg weekly  Move: -  Semglee 8 units to am  Increase: - Lyumjev 8-10 units before dinner  Please return in 4-6 months.  - we checked her HbA1c: 6.6% (slightly higher) - advised to check sugars at different times of the day - 4x a day, rotating check times - advised  for yearly eye exams >> she is UTD - return to clinic in 4-6 months  2. B12 deficiency -Latest B12 level was slightly high: Lab Results  Component Value Date   VITAMINB12 1,034 (H) 02/13/2022  -She continues on 1000 mcg B12 daily in the B complex  3.  HL -Reviewed latest lipid panel from 02/2022: Slightly above goal: Lab Results  Component Value Date   CHOL 169 02/13/2022   HDL 61.10 02/13/2022   LDLCALC 73 02/13/2022   LDLDIRECT 62.0 03/22/2015   TRIG 178.0 (H) 02/13/2022   CHOLHDL 3 02/13/2022  -Continues on Zocor 10 mg daily without side effects  Philemon Kingdom, MD PhD [facility]

## 2022-06-15 NOTE — Patient Instructions (Addendum)
Please continue: - Metformin 2000 mg with dinner - Jardiance 25 mg daily - Mounjaro 10 mg weekly  Move: - Semglee 8 units to am  Increase: - Lyumjev 8-10 units before dinner  Please return in 4-6 months.

## 2022-06-19 ENCOUNTER — Ambulatory Visit: Payer: 59 | Attending: Family Medicine

## 2022-06-19 DIAGNOSIS — M6281 Muscle weakness (generalized): Secondary | ICD-10-CM | POA: Diagnosis not present

## 2022-06-19 DIAGNOSIS — R293 Abnormal posture: Secondary | ICD-10-CM | POA: Insufficient documentation

## 2022-06-19 DIAGNOSIS — M546 Pain in thoracic spine: Secondary | ICD-10-CM | POA: Diagnosis not present
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
## 2022-06-22 ENCOUNTER — Other Ambulatory Visit (HOSPITAL_COMMUNITY): Payer: Self-pay

## 2022-06-26 ENCOUNTER — Ambulatory Visit: Payer: 59

## 2022-06-26 ENCOUNTER — Other Ambulatory Visit (HOSPITAL_COMMUNITY): Payer: Self-pay

## 2022-06-26 DIAGNOSIS — R293 Abnormal posture: Secondary | ICD-10-CM

## 2022-06-26 DIAGNOSIS — M6281 Muscle weakness (generalized): Secondary | ICD-10-CM | POA: Diagnosis not present

## 2022-06-26 DIAGNOSIS — M546 Pain in thoracic spine: Secondary | ICD-10-CM | POA: Diagnosis not present
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
## 2022-07-06 ENCOUNTER — Ambulatory Visit: Payer: 59

## 2022-07-27 ENCOUNTER — Other Ambulatory Visit (HOSPITAL_COMMUNITY): Payer: Self-pay

## 2022-07-31 ENCOUNTER — Other Ambulatory Visit: Payer: Self-pay | Admitting: Family Medicine

## 2022-07-31 ENCOUNTER — Other Ambulatory Visit (HOSPITAL_COMMUNITY): Payer: Self-pay

## 2022-07-31 DIAGNOSIS — S29019A Strain of muscle and tendon of unspecified wall of thorax, initial encounter: Secondary | ICD-10-CM

## 2022-07-31 MED ORDER — METHOCARBAMOL 500 MG PO TABS
500.0000 mg | ORAL_TABLET | Freq: Four times a day (QID) | ORAL | 0 refills | Status: DC | PRN
  Filled 2022-07-31: qty 30, 8d supply, fill #0

## 2022-08-05 ENCOUNTER — Other Ambulatory Visit: Payer: Self-pay | Admitting: Internal Medicine

## 2022-08-05 DIAGNOSIS — E1165 Type 2 diabetes mellitus with hyperglycemia: Secondary | ICD-10-CM

## 2022-08-07 ENCOUNTER — Other Ambulatory Visit (HOSPITAL_COMMUNITY): Payer: Self-pay

## 2022-08-07 MED ORDER — MOUNJARO 10 MG/0.5ML ~~LOC~~ SOAJ
10.0000 mg | SUBCUTANEOUS | 5 refills | Status: DC
  Filled 2022-08-07: qty 2, 28d supply, fill #0
  Filled 2022-09-03 – 2022-10-02 (×2): qty 2, 28d supply, fill #1
  Filled 2022-10-24: qty 2, 28d supply, fill #2
  Filled 2022-11-21: qty 2, 28d supply, fill #0
  Filled 2022-12-18: qty 2, 28d supply, fill #1
  Filled 2022-12-20: qty 2, 28d supply, fill #0
  Filled 2023-01-13 – 2023-02-13 (×2): qty 2, 28d supply, fill #1

## 2022-08-10 ENCOUNTER — Other Ambulatory Visit (HOSPITAL_COMMUNITY): Payer: Self-pay

## 2022-08-14 ENCOUNTER — Other Ambulatory Visit: Payer: Self-pay | Admitting: Internal Medicine

## 2022-08-14 ENCOUNTER — Other Ambulatory Visit (HOSPITAL_COMMUNITY): Payer: Self-pay

## 2022-08-14 ENCOUNTER — Other Ambulatory Visit: Payer: Self-pay | Admitting: Family Medicine

## 2022-08-14 DIAGNOSIS — E782 Mixed hyperlipidemia: Secondary | ICD-10-CM

## 2022-08-14 MED ORDER — SIMVASTATIN 20 MG PO TABS
20.0000 mg | ORAL_TABLET | Freq: Every day | ORAL | 0 refills | Status: DC
  Filled 2022-08-14: qty 90, 90d supply, fill #0

## 2022-08-14 MED ORDER — INSULIN GLARGINE-YFGN 100 UNIT/ML ~~LOC~~ SOPN
8.0000 [IU] | PEN_INJECTOR | Freq: Every day | SUBCUTANEOUS | 3 refills | Status: DC
  Filled 2022-08-14: qty 6, 60d supply, fill #0
  Filled 2022-10-04: qty 6, 60d supply, fill #1
  Filled 2022-12-24: qty 6, 60d supply, fill #2
  Filled 2023-03-14: qty 6, 60d supply, fill #3
  Filled 2023-03-14: qty 6, 60d supply, fill #0
  Filled 2023-04-20: qty 15, 30d supply, fill #1

## 2022-08-15 ENCOUNTER — Other Ambulatory Visit (HOSPITAL_COMMUNITY): Payer: Self-pay

## 2022-08-15 ENCOUNTER — Other Ambulatory Visit: Payer: Self-pay | Admitting: Family Medicine

## 2022-08-15 DIAGNOSIS — Z1231 Encounter for screening mammogram for malignant neoplasm of breast: Secondary | ICD-10-CM

## 2022-08-22 ENCOUNTER — Other Ambulatory Visit: Payer: Self-pay

## 2022-08-22 ENCOUNTER — Other Ambulatory Visit: Payer: Self-pay | Admitting: Family Medicine

## 2022-08-22 ENCOUNTER — Other Ambulatory Visit (HOSPITAL_COMMUNITY): Payer: Self-pay

## 2022-08-22 DIAGNOSIS — E782 Mixed hyperlipidemia: Secondary | ICD-10-CM

## 2022-08-25 ENCOUNTER — Other Ambulatory Visit (HOSPITAL_COMMUNITY): Payer: Self-pay

## 2022-08-28 ENCOUNTER — Other Ambulatory Visit (HOSPITAL_COMMUNITY): Payer: Self-pay

## 2022-09-07 ENCOUNTER — Other Ambulatory Visit (HOSPITAL_COMMUNITY): Payer: Self-pay

## 2022-09-22 ENCOUNTER — Other Ambulatory Visit (HOSPITAL_COMMUNITY): Payer: Self-pay

## 2022-09-25 ENCOUNTER — Other Ambulatory Visit (HOSPITAL_COMMUNITY): Payer: Self-pay

## 2022-09-27 ENCOUNTER — Other Ambulatory Visit (HOSPITAL_COMMUNITY): Payer: Self-pay

## 2022-09-29 ENCOUNTER — Ambulatory Visit
Admission: RE | Admit: 2022-09-29 | Discharge: 2022-09-29 | Disposition: A | Discharge status: Home / Self Care | Payer: 59 | Source: Ambulatory Visit | Attending: Family Medicine | Admitting: Family Medicine

## 2022-09-29 DIAGNOSIS — Z1231 Encounter for screening mammogram for malignant neoplasm of breast: Secondary | ICD-10-CM | POA: Diagnosis not present

## 2022-10-02 ENCOUNTER — Other Ambulatory Visit (HOSPITAL_COMMUNITY): Payer: Self-pay

## 2022-10-04 ENCOUNTER — Other Ambulatory Visit (HOSPITAL_COMMUNITY): Payer: Self-pay

## 2022-10-23 ENCOUNTER — Other Ambulatory Visit (HOSPITAL_COMMUNITY): Payer: Self-pay

## 2022-10-24 ENCOUNTER — Other Ambulatory Visit (HOSPITAL_COMMUNITY): Payer: Self-pay

## 2022-10-25 ENCOUNTER — Other Ambulatory Visit (HOSPITAL_COMMUNITY): Payer: Self-pay

## 2022-10-30 ENCOUNTER — Other Ambulatory Visit (HOSPITAL_COMMUNITY): Payer: Self-pay

## 2022-11-14 ENCOUNTER — Other Ambulatory Visit (HOSPITAL_COMMUNITY): Payer: Self-pay

## 2022-11-14 ENCOUNTER — Other Ambulatory Visit: Payer: Self-pay | Admitting: Family Medicine

## 2022-11-14 DIAGNOSIS — E782 Mixed hyperlipidemia: Secondary | ICD-10-CM

## 2022-11-15 ENCOUNTER — Other Ambulatory Visit (HOSPITAL_COMMUNITY): Payer: Self-pay

## 2022-11-15 MED ORDER — SIMVASTATIN 20 MG PO TABS
20.0000 mg | ORAL_TABLET | Freq: Every day | ORAL | 0 refills | Status: DC
  Filled 2022-11-15: qty 30, 30d supply, fill #0

## 2022-11-21 ENCOUNTER — Other Ambulatory Visit (HOSPITAL_COMMUNITY): Payer: Self-pay

## 2022-11-21 ENCOUNTER — Other Ambulatory Visit: Payer: Self-pay

## 2022-11-21 ENCOUNTER — Telehealth: Payer: Self-pay

## 2022-11-21 NOTE — Telephone Encounter (Signed)
Pt called to advise one of the injections in her Mounjaro rx was faulty and pt will now be 1 week short with her injections.

## 2022-11-27 ENCOUNTER — Other Ambulatory Visit (HOSPITAL_COMMUNITY): Payer: Self-pay
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
## 2022-11-29 ENCOUNTER — Ambulatory Visit (INDEPENDENT_AMBULATORY_CARE_PROVIDER_SITE_OTHER): Payer: 59 | Admitting: Family Medicine

## 2022-11-29 ENCOUNTER — Other Ambulatory Visit (HOSPITAL_COMMUNITY)
Admission: RE | Admit: 2022-11-29 | Discharge: 2022-11-29 | Disposition: A | Discharge status: Home / Self Care | Payer: 59 | Source: Ambulatory Visit | Attending: Family Medicine | Admitting: Family Medicine

## 2022-11-29 ENCOUNTER — Other Ambulatory Visit (HOSPITAL_COMMUNITY): Payer: Self-pay

## 2022-11-29 VITALS — BP 104/60 | HR 92 | Temp 98.0°F | Resp 18 | Ht 67.0 in | Wt 129.4 lb

## 2022-11-29 DIAGNOSIS — Z7984 Long term (current) use of oral hypoglycemic drugs: Secondary | ICD-10-CM

## 2022-11-29 DIAGNOSIS — E782 Mixed hyperlipidemia: Secondary | ICD-10-CM | POA: Diagnosis not present

## 2022-11-29 DIAGNOSIS — Z130 Encounter for screening for diseases of the blood and blood-forming organs and certain disorders involving the immune mechanism: Secondary | ICD-10-CM

## 2022-11-29 DIAGNOSIS — Z7985 Long-term (current) use of injectable non-insulin antidiabetic drugs: Secondary | ICD-10-CM | POA: Diagnosis not present

## 2022-11-29 DIAGNOSIS — Z794 Long term (current) use of insulin: Secondary | ICD-10-CM

## 2022-11-29 DIAGNOSIS — E119 Type 2 diabetes mellitus without complications: Secondary | ICD-10-CM | POA: Diagnosis not present

## 2022-11-29 DIAGNOSIS — N926 Irregular menstruation, unspecified: Secondary | ICD-10-CM | POA: Diagnosis not present

## 2022-11-29 DIAGNOSIS — Z124 Encounter for screening for malignant neoplasm of cervix: Secondary | ICD-10-CM | POA: Insufficient documentation

## 2022-11-29 DIAGNOSIS — Z0000 Encounter for general adult medical examination without abnormal findings: Secondary | ICD-10-CM

## 2022-11-29 DIAGNOSIS — L659 Nonscarring hair loss, unspecified: Secondary | ICD-10-CM

## 2022-11-29 DIAGNOSIS — Z1329 Encounter for screening for other suspected endocrine disorder: Secondary | ICD-10-CM | POA: Diagnosis not present

## 2022-11-29 DIAGNOSIS — E538 Deficiency of other specified B group vitamins: Secondary | ICD-10-CM

## 2022-11-29 DIAGNOSIS — R5383 Other fatigue: Secondary | ICD-10-CM | POA: Diagnosis not present

## 2022-11-29 MED ORDER — SIMVASTATIN 20 MG PO TABS
20.0000 mg | ORAL_TABLET | Freq: Every day | ORAL | 3 refills | Status: DC
  Filled 2022-11-29 – 2022-12-14 (×2): qty 90, 90d supply, fill #0
  Filled 2023-03-07: qty 90, 90d supply, fill #1
  Filled 2023-06-12: qty 90, 90d supply, fill #2
  Filled 2023-09-12: qty 90, 90d supply, fill #3

## 2022-11-29 NOTE — Patient Instructions (Addendum)
It was great to see you today! I will be in touch with your labs soon as possible.  We will check an FSH level, assuming this is consistent with menopause I think you are fine to stop taking birth control pills  Please send your gastroenterologist a message and inquire if you are now due for a colonoscopy  For hair loss concern, we will look for any lab explanation  Assuming all is okay Rogaine foam can be helpful

## 2022-11-30 ENCOUNTER — Encounter: Payer: Self-pay | Admitting: Family Medicine

## 2022-11-30 LAB — CBC
HCT: 40.2 % (ref 36.0–46.0)
Hemoglobin: 13 g/dL (ref 12.0–15.0)
MCHC: 32.4 g/dL (ref 30.0–36.0)
MCV: 92.8 fl (ref 78.0–100.0)
Platelets: 305 10*3/uL (ref 150.0–400.0)
RBC: 4.33 Mil/uL (ref 3.87–5.11)
RDW: 13.7 % (ref 11.5–15.5)
WBC: 7 10*3/uL (ref 4.0–10.5)

## 2022-11-30 LAB — FERRITIN: Ferritin: 28.3 ng/mL (ref 10.0–291.0)

## 2022-11-30 LAB — COMPREHENSIVE METABOLIC PANEL
ALT: 18 U/L (ref 0–35)
AST: 17 U/L (ref 0–37)
Albumin: 4.3 g/dL (ref 3.5–5.2)
Alkaline Phosphatase: 44 U/L (ref 39–117)
BUN: 19 mg/dL (ref 6–23)
CO2: 29 mEq/L (ref 19–32)
Calcium: 9.3 mg/dL (ref 8.4–10.5)
Chloride: 98 mEq/L (ref 96–112)
Creatinine, Ser: 1.01 mg/dL (ref 0.40–1.20)
GFR: 63.17 mL/min (ref >60.00)
Glucose, Bld: 99 mg/dL (ref 70–99)
Potassium: 4 mEq/L (ref 3.5–5.1)
Sodium: 136 mEq/L (ref 135–145)
Total Bilirubin: 0.5 mg/dL (ref 0.2–1.2)
Total Protein: 6.8 g/dL (ref 6.0–8.3)

## 2022-11-30 LAB — TSH: TSH: 1.11 u[IU]/mL (ref 0.35–5.50)

## 2022-11-30 LAB — MICROALBUMIN / CREATININE URINE RATIO
Creatinine,U: 49.3 mg/dL
Microalb Creat Ratio: 1.4 mg/g (ref 0.0–30.0)
Microalb, Ur: <0.7 mg/dL (ref 0.0–1.9)

## 2022-11-30 LAB — LIPID PANEL
Cholesterol: 158 mg/dL (ref 0–200)
HDL: 69 mg/dL (ref >39.00)
LDL Cholesterol: 72 mg/dL (ref 0–99)
NonHDL: 89.42
Total CHOL/HDL Ratio: 2
Triglycerides: 89 mg/dL (ref 0.0–149.0)
VLDL: 17.8 mg/dL (ref 0.0–40.0)

## 2022-11-30 LAB — HEMOGLOBIN A1C: Hgb A1c MFr Bld: 7 % — ABNORMAL HIGH (ref 4.6–6.5)

## 2022-11-30 LAB — FOLLICLE STIMULATING HORMONE: FSH: 50.2 m[IU]/mL

## 2022-11-30 LAB — VITAMIN D 25 HYDROXY (VIT D DEFICIENCY, FRACTURES): VITD: 35.78 ng/mL (ref 30.00–100.00)

## 2022-11-30 LAB — VITAMIN B12: Vitamin B-12: 1361 pg/mL — ABNORMAL HIGH (ref 211–911)

## 2022-12-02 ENCOUNTER — Other Ambulatory Visit (HOSPITAL_COMMUNITY): Payer: Self-pay

## 2022-12-05 ENCOUNTER — Encounter: Payer: Self-pay | Admitting: Gastroenterology

## 2022-12-05 ENCOUNTER — Encounter: Payer: Self-pay | Admitting: Family Medicine

## 2022-12-05 DIAGNOSIS — N912 Amenorrhea, unspecified: Secondary | ICD-10-CM

## 2022-12-06 ENCOUNTER — Encounter: Payer: Self-pay | Admitting: Family Medicine

## 2022-12-06 LAB — CYTOLOGY - PAP
Adequacy: ABSENT
Comment: NEGATIVE
Diagnosis: NEGATIVE
High risk HPV: NEGATIVE

## 2022-12-13 ENCOUNTER — Encounter (HOSPITAL_COMMUNITY)
Admission: RE | Admit: 2022-12-13 | Discharge: 2022-12-13 | Disposition: A | Discharge status: Home / Self Care | Payer: 59 | Source: Ambulatory Visit

## 2022-12-14 ENCOUNTER — Other Ambulatory Visit (HOSPITAL_COMMUNITY): Payer: Self-pay

## 2022-12-14 LAB — HM DIABETES EYE EXAM

## 2022-12-18 ENCOUNTER — Encounter: Payer: Self-pay | Admitting: Family Medicine

## 2022-12-18 ENCOUNTER — Other Ambulatory Visit: Payer: Self-pay

## 2022-12-18 NOTE — Telephone Encounter (Signed)
I'm not seeing that this lab was collected at all. Please advise on next steps.

## 2022-12-19 ENCOUNTER — Encounter: Payer: Self-pay | Admitting: Family Medicine

## 2022-12-19 NOTE — Addendum Note (Signed)
Addended by: Mertha Finders on: 12/19/2022 04:33 PM   Modules accepted: Orders

## 2022-12-20 ENCOUNTER — Other Ambulatory Visit (HOSPITAL_COMMUNITY): Payer: Self-pay

## 2022-12-20 ENCOUNTER — Other Ambulatory Visit: Payer: Self-pay

## 2022-12-20 ENCOUNTER — Other Ambulatory Visit (INDEPENDENT_AMBULATORY_CARE_PROVIDER_SITE_OTHER): Payer: 59

## 2022-12-20 DIAGNOSIS — N912 Amenorrhea, unspecified: Secondary | ICD-10-CM | POA: Diagnosis not present

## 2022-12-20 LAB — FOLLICLE STIMULATING HORMONE: FSH: 89.3 m[IU]/mL

## 2022-12-21 ENCOUNTER — Encounter: Payer: Self-pay | Admitting: Family Medicine

## 2022-12-22 ENCOUNTER — Other Ambulatory Visit (HOSPITAL_COMMUNITY): Payer: Self-pay

## 2022-12-22 ENCOUNTER — Other Ambulatory Visit: Payer: Self-pay | Admitting: Family Medicine

## 2022-12-22 DIAGNOSIS — B009 Herpesviral infection, unspecified: Secondary | ICD-10-CM

## 2022-12-22 MED ORDER — ACYCLOVIR 400 MG PO TABS
800.0000 mg | ORAL_TABLET | Freq: Two times a day (BID) | ORAL | 2 refills | Status: DC | PRN
  Filled 2022-12-22: qty 50, 13d supply, fill #0
  Filled 2023-03-13 (×2): qty 50, 13d supply, fill #1
  Filled 2023-04-26: qty 50, 13d supply, fill #2

## 2022-12-25 ENCOUNTER — Other Ambulatory Visit: Payer: Self-pay | Admitting: Internal Medicine

## 2022-12-25 ENCOUNTER — Other Ambulatory Visit (HOSPITAL_COMMUNITY): Payer: Self-pay

## 2022-12-25 MED ORDER — LYUMJEV KWIKPEN 100 UNIT/ML ~~LOC~~ SOPN
6.0000 [IU] | PEN_INJECTOR | Freq: Two times a day (BID) | SUBCUTANEOUS | 3 refills | Status: DC
  Filled 2022-12-25: qty 6, 30d supply, fill #0

## 2022-12-27 ENCOUNTER — Other Ambulatory Visit (HOSPITAL_COMMUNITY): Payer: Self-pay

## 2022-12-29 ENCOUNTER — Ambulatory Visit: Payer: 59 | Admitting: Internal Medicine

## 2022-12-29 ENCOUNTER — Encounter: Payer: Self-pay | Admitting: Internal Medicine

## 2022-12-29 ENCOUNTER — Other Ambulatory Visit (HOSPITAL_COMMUNITY): Payer: Self-pay

## 2022-12-29 VITALS — BP 106/60 | HR 98 | Ht 67.0 in | Wt 129.4 lb

## 2022-12-29 DIAGNOSIS — Z7985 Long-term (current) use of injectable non-insulin antidiabetic drugs: Secondary | ICD-10-CM

## 2022-12-29 DIAGNOSIS — E782 Mixed hyperlipidemia: Secondary | ICD-10-CM

## 2022-12-29 DIAGNOSIS — Z794 Long term (current) use of insulin: Secondary | ICD-10-CM

## 2022-12-29 DIAGNOSIS — E1165 Type 2 diabetes mellitus with hyperglycemia: Secondary | ICD-10-CM | POA: Diagnosis not present

## 2022-12-29 DIAGNOSIS — Z7984 Long term (current) use of oral hypoglycemic drugs: Secondary | ICD-10-CM

## 2022-12-29 DIAGNOSIS — E538 Deficiency of other specified B group vitamins: Secondary | ICD-10-CM | POA: Diagnosis not present

## 2022-12-29 DIAGNOSIS — E119 Type 2 diabetes mellitus without complications: Secondary | ICD-10-CM

## 2022-12-29 NOTE — Progress Notes (Signed)
Patient ID: [name] Young, female   DOB: 03-22-1971, 52 y.o.   MRN: [id number]  HPI: [name] Young is a 52 y.o.-year-old female, presenting for f/u for DM2, dx in 2008 (GDM 1998), insulin-dependent, uncontrolled, without long-term complications. Last visit 6 months ago.  Interim history: No increased urination, blurry vision, nausea, chest pain. She stopped diet sodas but she stopped 2/2 dental erosions.  DM2: Reviewed HbA1c levels: Lab Results  Component Value Date   HGBA1C 7.0 (H) 11/29/2022   HGBA1C 6.6 (A) 06/15/2022   HGBA1C 6.3 (A) 12/08/2021   HGBA1C 7.1 (A) 08/05/2021   HGBA1C 6.9 (A) 05/03/2021   HGBA1C 8.9 (H) 12/29/2020   HGBA1C 7.3 (A) 09/28/2020   HGBA1C 7.0 (A) 05/31/2020   HGBA1C 7.5 (H) 01/05/2020   HGBA1C 6.8 (A) 06/04/2019   HGBA1C 7.8 (H) 02/28/2019   HGBA1C 7.6 (A) 10/22/2018   HGBA1C 6.9 (A) 03/22/2018   HGBA1C 6.7 11/19/2017   HGBA1C 6.7 08/21/2017   HGBA1C 6.5 04/20/2017   HGBA1C 6.7 01/19/2017   HGBA1C 6.4 09/07/2016   HGBA1C 6.6 (H) 03/16/2016   HGBA1C 6.5 03/16/2016   HGBA1C 6.5 12/13/2015   HGBA1C 6.4 09/13/2015   HGBA1C 7.6 06/14/2015   HGBA1C 6.8 (H) 03/22/2015   HGBA1C 6.5 03/12/2015   HGBA1C 7.0 (H) 12/10/2014   HGBA1C 7.6 (H) 08/26/2014   HGBA1C 7.5 (H) 05/15/2014   HGBA1C 7.3 (H) 02/09/2014   HGBA1C 7.1 (H) 01/28/2013   Since she was dx with DM >> she lost 50 lbs and was able to come off Lantus.  She is on: - Metformin 2000 mg with dinner - Jardiance 25 mg daily - Lantus 8 units at bedtime - added 07/2021 >> with dinner >> Semglee 8 units in am - Lyumjev 10 units before dinner - started 09/2020 >> Lyumjev 8  >> 8-10 units before dinner - Mounjaro 10 mg weekly She was on Trulicity 1.5 mg weekly -increased 10/2018 >> Ozempic 1 mg prev. She was on Lantus before. She was on Glipizide.  Pt checks her sugars >4 times a day now with a Dexcom CGM:  Prev.:   Previously:   Previously:   Lowest sugar was 49 (alcohol) >> ...   58 (alcohol) >> 70 >> 60s >> ; she has hypoglycemia awareness in the 60s Highest sugar was  270s >> 200s >> 317.  Glucometer: [employer] >> [employer]  Pt's meals are: - Breakfast: Eggs or oatmeal >> bacon and eggs - Lunch: Salad or veggies or tuna salad with saltines or chicken tenders - Dinner: Fast food -take out - Snacks: Graham crackers  -No CKD, last BUN/creatinine:  Lab Results  Component Value Date   BUN 19 11/29/2022   CREATININE 1.01 11/29/2022   ACR normal.  Previously on lisinopril but now off. Lab Results  Component Value Date   MICRALBCREAT 1.4 11/29/2022   MICRALBCREAT 0.6 02/13/2022   MICRALBCREAT 1.8 09/28/2020   MICRALBCREAT 1.7 01/05/2020   MICRALBCREAT 0.8 10/22/2018   MICRALBCREAT 0.6 04/20/2017   MICRALBCREAT 0.7 03/16/2016   MICRALBCREAT 0.7 03/22/2015   MICRALBCREAT 0.7 10/27/2014   MICRALBCREAT 0.1 02/09/2014   -+ HL; last set of lipids: Lab Results  Component Value Date   CHOL 158 11/29/2022   HDL 69.00 11/29/2022   LDLCALC 72 11/29/2022   LDLDIRECT 62.0 03/22/2015   TRIG 89.0 11/29/2022   CHOLHDL 2 11/29/2022  On Zocor 20.  - last eye exam was in 2024: reportedly No [facility].   -She  has numbness and tingling in her feet.  Last foot exam 11/29/2022.  Vit B12 def. -She continues on B complex and 1000 mcg B12 daily  Review vitamin B12 levels: Lab Results  Component Value Date   VITAMINB12 1,361 (H) 11/29/2022   VITAMINB12 1,034 (H) 02/13/2022   VITAMINB12 963 (H) 09/28/2020   VITAMINB12 490 01/05/2020   VITAMINB12 1,216 (H) 02/28/2019   VITAMINB12 890 10/22/2018   VITAMINB12 909 12/05/2017   VITAMINB12 782 01/24/2017   VITAMINB12 969 (H) 03/16/2016   VITAMINB12 503 09/13/2015   ROS: + see HPI  I reviewed pt's medications, allergies, PMH, social hx, family hx, and changes were documented in the history of present illness. Otherwise, unchanged from my initial visit note.  Past Medical History:  Diagnosis Date    Diabetes mellitus    Type II   Family history of breast cancer    Family history of breast cancer    No past surgical history on file. 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 50 tablet 2   cetirizine (ZYRTEC) 10 MG tablet Take 10 mg by mouth daily.     Continuous Glucose Sensor (DEXCOM G7 SENSOR) MISC Use as directed 9 each 3   empagliflozin (JARDIANCE) 25 MG TABS tablet Take 1 tablet (25 mg total) by mouth daily. 90 tablet 3   fluticasone (FLONASE) 50 MCG/ACT nasal spray Place 1 spray into both nostrils at bedtime. 16 g 6   insulin glargine-yfgn (SEMGLEE, YFGN,) 100 UNIT/ML Pen Inject 8-10 Units into the skin daily. 15 mL 3   Insulin Lispro-aabc (LYUMJEV KWIKPEN) 100 UNIT/ML KwikPen Inject 6-8 Units into the skin 2 (two) times daily. 15 mL 3   Insulin Pen Needle (UNIFINE PENTIPS) 32G X 4 MM MISC Use once daily. 100 each 3   Lancets (FREESTYLE) lancets Use to check blood sugar 2 times a day. 200 each 12   metFORMIN (GLUCOPHAGE) 1000 MG tablet Take 2 tablets (2,000 mg total) by mouth daily with supper. 180 tablet 3   methocarbamol (ROBAXIN) 500 MG tablet Take 1 tablet (500 mg total) by mouth every 6 (six) hours as needed for muscle spasms. 30 tablet 0   Multiple Vitamins-Minerals (DIASENSE MULTIVITAMIN) TABS Take 1 tablet by mouth daily.     norethindrone-ethinyl estradiol 1/35 (ALAYCEN 1/35) tablet Take 1 tablet by mouth daily. 84 tablet 3   Omeprazole (PRILOSEC PO) Take by mouth as needed.       simvastatin (ZOCOR) 20 MG tablet Take 1 tablet (20 mg total) by mouth at bedtime. *need labs for more refills* 90 tablet 3   tirzepatide (MOUNJARO) 10 MG/0.5ML Pen Inject 10 mg into the skin once a week. 6 mL 3   tirzepatide (MOUNJARO) 10 MG/0.5ML Pen Inject 10 mg into the skin once a week. 2 mL 5   tirzepatide (MOUNJARO) 12.5 MG/0.5ML Pen Inject 12.5 mg into the skin once a week. 2 mL  0   vitamin B-12 (CYANOCOBALAMIN) 1000 MCG tablet Take 1,000 mcg by mouth daily.      [DISCONTINUED] glipiZIDE (GLUCOTROL) 5 MG tablet Take 1 tablet (5 mg total) by mouth daily. 90 tablet 3   No current facility-administered medications on file prior to visit.   Allergies  Allergen Reactions   Penicillins     REACTION: RASH   Shingrix [Zoster Vac Recomb Adjuvanted]     Patient developed itching at site a couple of days after shot.  Decided not to administer second dose   Family History  Problem Relation Age of Onset   Diabetes Other        Familly Hx First degree relative   Breast cancer Mother 48   Breast cancer Maternal Aunt 56   Brain cancer Maternal Uncle 39   Colon cancer Neg Hx    Esophageal cancer Neg Hx    Stomach cancer Neg Hx    Rectal cancer Neg Hx    PE: BP 106/60   Pulse 98   Ht 5\' 7"  (1.702 m)   Wt 129 lb 6.4 oz (58.7 kg)   SpO2 99%   BMI 20.27 kg/m     Wt Readings from Last 3 Encounters:  12/29/22 129 lb 6.4 oz (58.7 kg)  11/29/22 129 lb 6.4 oz (58.7 kg)  06/15/22 135 lb 6.4 oz (61.4 kg)   Constitutional: normal weight, in NAD Eyes:  EOMI, no exophthalmos ENT: no neck masses, no cervical lymphadenopathy Cardiovascular: RRR (no tachycardia at the time of the exam), No MRG Respiratory: CTA B Musculoskeletal: no deformities Skin:no rashes Neurological: no tremor with outstretched hands  ASSESSMENT: 1. DM2, insulin-dependent, uncontrolled, without long term complications, but with hyperglycemia  2. B12 deficiency  3. HL  PLAN:  1. Patient with longstanding,  uncontrolled, type 2 diabetes, on a complex medication regimen including metformin, SGLT2 inhibitor, weekly GLP-1/GIP receptor agonist and basal/bolus insulin, with a higher HbA1c obtained last month, at 7.0%, increased from 6.6% at last visit.  At last visit, we moved her Semglee dose in the morning and increased the Lyumjev dose before dinner, which was her largest meal, and for which she had more fast food.  She had occasional low blood sugars overnight which prompted to move the send to the morning.  We did not change the rest of the regimen. -We could consider a mechanical pump (for example CeQur) or regular insulin pump for her in the future. CGM interpretation: -At today's visit, we reviewed her CGM downloads: It appears that 83% of values are in target range (goal >70%), while 16% are higher than 180 (goal <25%), and 1% are lower than 70 (goal <4%).  The calculated average blood sugar is 148.  The projected HbA1c for the next 3 months (GMI) is 6.8%. -Reviewing the CGM trends, sugars are a little improved - fluctuating mainly within the target range with few hyperglycemic exceptions after ~3-4 pm. Some of these are quite high, up to 300s she mentions that these were due to cheetos. -Overall, since sugars are improved, I did not suggest changes in her regimen.  We did discuss about the beneficial effect of stopping diet sodas on her gut microbiome. - I suggested to:  Patient Instructions  Please continue: - Metformin 2000 mg with dinner - Jardiance 25 mg daily - Mounjaro 10 mg weekly - Semglee 8 units in am - Lyumjev 8-10 units before dinner  Please return in 4-6 months.  - advised to check sugars at different times  of the day - 4x a day, rotating check times - advised for yearly eye exams >> she is UTD - return to clinic in 4-6 months  2. B12 deficiency -Latest B12 level was slightly high: Lab Results  Component Value Date   VITAMINB12 1,361 (H) 11/29/2022  -She continues 1000 mcg  B12 daily and the B complex  3.  HL -Reviewed latest lipid panel: At goal: Lab Results  Component Value Date   CHOL 158 11/29/2022   HDL 69.00 11/29/2022   LDLCALC 72 11/29/2022   LDLDIRECT 62.0 03/22/2015   TRIG 89.0 11/29/2022   CHOLHDL 2 11/29/2022  -Continues Zocor 10 mg daily without side effects  Carlus Pavlov, MD PhD [facility]

## 2022-12-29 NOTE — Patient Instructions (Signed)
Please continue: - Metformin 2000 mg with dinner - Jardiance 25 mg daily - Mounjaro 10 mg weekly - Semglee 8 units in am - Lyumjev 8-10 units before dinner  Please return in 4-6 months.

## 2023-01-15 ENCOUNTER — Other Ambulatory Visit (HOSPITAL_COMMUNITY): Payer: Self-pay

## 2023-01-18 ENCOUNTER — Other Ambulatory Visit (HOSPITAL_COMMUNITY): Payer: Self-pay

## 2023-01-31 ENCOUNTER — Other Ambulatory Visit (HOSPITAL_COMMUNITY): Payer: Self-pay

## 2023-01-31 ENCOUNTER — Other Ambulatory Visit: Payer: Self-pay

## 2023-02-10 ENCOUNTER — Other Ambulatory Visit: Payer: Self-pay | Admitting: Internal Medicine

## 2023-02-10 DIAGNOSIS — E119 Type 2 diabetes mellitus without complications: Secondary | ICD-10-CM

## 2023-02-11 MED ORDER — METFORMIN HCL 1000 MG PO TABS
2000.0000 mg | ORAL_TABLET | Freq: Every day | ORAL | 1 refills | Status: DC
  Filled 2023-02-11: qty 180, 90d supply, fill #0
  Filled 2023-05-15: qty 180, 90d supply, fill #1

## 2023-02-12 ENCOUNTER — Other Ambulatory Visit (HOSPITAL_COMMUNITY): Payer: Self-pay

## 2023-02-12 ENCOUNTER — Other Ambulatory Visit: Payer: Self-pay

## 2023-02-13 ENCOUNTER — Other Ambulatory Visit (HOSPITAL_COMMUNITY): Payer: Self-pay

## 2023-03-07 ENCOUNTER — Other Ambulatory Visit (HOSPITAL_COMMUNITY): Payer: Self-pay

## 2023-03-08 ENCOUNTER — Ambulatory Visit (AMBULATORY_SURGERY_CENTER): Payer: 59

## 2023-03-08 ENCOUNTER — Other Ambulatory Visit: Payer: Self-pay

## 2023-03-08 ENCOUNTER — Encounter: Payer: Self-pay | Admitting: Gastroenterology

## 2023-03-08 VITALS — Ht 67.0 in | Wt 130.0 lb

## 2023-03-08 DIAGNOSIS — K51919 Ulcerative colitis, unspecified with unspecified complications: Secondary | ICD-10-CM

## 2023-03-08 DIAGNOSIS — Z86010 Personal history of colonic polyps: Secondary | ICD-10-CM

## 2023-03-08 MED ORDER — NA SULFATE-K SULFATE-MG SULF 17.5-3.13-1.6 GM/177ML PO SOLN: 1.0000 | Freq: Once | ORAL | 0 refills | Status: AC

## 2023-03-08 NOTE — Progress Notes (Signed)
Denies allergies to eggs or soy products. Denies complication of anesthesia or sedation. Denies use of weight loss medication. Denies use of O2.   Emmi instructions given for colonoscopy.  

## 2023-03-13 ENCOUNTER — Other Ambulatory Visit (HOSPITAL_BASED_OUTPATIENT_CLINIC_OR_DEPARTMENT_OTHER): Payer: Self-pay

## 2023-03-13 ENCOUNTER — Other Ambulatory Visit (HOSPITAL_COMMUNITY): Payer: Self-pay

## 2023-03-14 ENCOUNTER — Other Ambulatory Visit: Payer: Self-pay | Admitting: Family Medicine

## 2023-03-14 ENCOUNTER — Other Ambulatory Visit (HOSPITAL_BASED_OUTPATIENT_CLINIC_OR_DEPARTMENT_OTHER): Payer: Self-pay

## 2023-03-14 ENCOUNTER — Other Ambulatory Visit (HOSPITAL_COMMUNITY): Payer: Self-pay

## 2023-03-14 DIAGNOSIS — S29019A Strain of muscle and tendon of unspecified wall of thorax, initial encounter: Secondary | ICD-10-CM

## 2023-03-14 MED ORDER — METHOCARBAMOL 500 MG PO TABS
500.0000 mg | ORAL_TABLET | Freq: Four times a day (QID) | ORAL | 0 refills | Status: DC | PRN
  Filled 2023-03-14 – 2023-03-22 (×2): qty 30, 8d supply, fill #0

## 2023-03-15 ENCOUNTER — Other Ambulatory Visit: Payer: Self-pay

## 2023-03-15 ENCOUNTER — Other Ambulatory Visit (HOSPITAL_BASED_OUTPATIENT_CLINIC_OR_DEPARTMENT_OTHER): Payer: Self-pay

## 2023-03-22 ENCOUNTER — Other Ambulatory Visit (HOSPITAL_COMMUNITY): Payer: Self-pay

## 2023-03-22 ENCOUNTER — Other Ambulatory Visit (HOSPITAL_BASED_OUTPATIENT_CLINIC_OR_DEPARTMENT_OTHER): Payer: Self-pay

## 2023-03-26 ENCOUNTER — Other Ambulatory Visit: Payer: Self-pay | Admitting: Internal Medicine

## 2023-03-26 ENCOUNTER — Other Ambulatory Visit (HOSPITAL_COMMUNITY): Payer: Self-pay

## 2023-03-26 MED ORDER — EMPAGLIFLOZIN 25 MG PO TABS
25.0000 mg | ORAL_TABLET | Freq: Every day | ORAL | 1 refills | Status: DC
  Filled 2023-03-26: qty 90, 90d supply, fill #0
  Filled 2023-06-13: qty 90, 90d supply, fill #1

## 2023-03-29 ENCOUNTER — Other Ambulatory Visit (HOSPITAL_COMMUNITY): Payer: Self-pay

## 2023-04-02 ENCOUNTER — Ambulatory Visit (AMBULATORY_SURGERY_CENTER): Payer: 59 | Admitting: Gastroenterology

## 2023-04-02 ENCOUNTER — Encounter: Payer: Self-pay | Admitting: Gastroenterology

## 2023-04-02 VITALS — BP 110/66 | HR 78 | Temp 98.2°F | Resp 13 | Ht 67.0 in | Wt 130.0 lb

## 2023-04-02 DIAGNOSIS — Z09 Encounter for follow-up examination after completed treatment for conditions other than malignant neoplasm: Secondary | ICD-10-CM

## 2023-04-02 DIAGNOSIS — E119 Type 2 diabetes mellitus without complications: Secondary | ICD-10-CM | POA: Diagnosis not present

## 2023-04-02 DIAGNOSIS — Z8719 Personal history of other diseases of the digestive system: Secondary | ICD-10-CM | POA: Diagnosis not present

## 2023-04-02 DIAGNOSIS — K529 Noninfective gastroenteritis and colitis, unspecified: Secondary | ICD-10-CM | POA: Diagnosis not present

## 2023-04-02 DIAGNOSIS — Z86010 Personal history of colonic polyps: Secondary | ICD-10-CM | POA: Diagnosis not present

## 2023-04-02 DIAGNOSIS — K5120 Ulcerative (chronic) proctitis without complications: Secondary | ICD-10-CM

## 2023-04-02 DIAGNOSIS — Z1211 Encounter for screening for malignant neoplasm of colon: Secondary | ICD-10-CM | POA: Diagnosis not present

## 2023-04-02 MED ORDER — SODIUM CHLORIDE 0.9 % IV SOLN: 500.0000 mL | Freq: Once | INTRAVENOUS | Status: DC

## 2023-04-02 NOTE — Op Note (Signed)
[facility] Patient Name: [name] Young Procedure Date: 04/02/2023 8:38 AM MRN: [id number] Endoscopist: Doristine Locks , MD, [id number] Age: 52 Referring MD:  Date of Birth: May 05, 1971 Gender: Female Account #: [id number] Procedure:                Colonoscopy with biopsy Indications:              Surveillance: Personal history of adenomatous                            polyps on last colonoscopy 3 years ago                           Last Colonoscopy was 03/09/2020 and notable for 3                            subcentimeter tubular adenomas, mild ulcerative                            proctitis with transition to normal mucosa at 20 cm                            (Mayo 1), normal remainder of the colon (normal                            biopsies), normal TI. Colitis sxs resolved with                            Canasa (no longer taking). Presents today for                            ongoing surveillance. Did have a very brief flare                            of lower GI symptoms last week which has since                            abated without medical intervention. Medicines:                Monitored Anesthesia Care Procedure:                Pre-Anesthesia Assessment:                           - Prior to the procedure, a History and Physical                            was performed, and patient medications and                            allergies were reviewed. The patient's tolerance of                            previous anesthesia was also reviewed. The risks  and benefits of the procedure and the sedation                            options and risks were discussed with the patient.                            All questions were answered, and informed consent                            was obtained. Prior Anticoagulants: The patient has                            taken no anticoagulant or antiplatelet agents. ASA                            Grade  Assessment: II - A patient with mild systemic                            disease. After reviewing the risks and benefits,                            the patient was deemed in satisfactory condition to                            undergo the procedure.                           After obtaining informed consent, the colonoscope                            was passed under direct vision. Throughout the                            procedure, the patient's blood pressure, pulse, and                            oxygen saturations were monitored continuously. The                            Olympus Scope SN: [id number] was introduced through                            the anus and advanced to the the terminal ileum.                            The colonoscopy was performed without difficulty.                            The patient tolerated the procedure well. The                            quality of the bowel preparation was good. The  terminal ileum, ileocecal valve, appendiceal                            orifice, and rectum were photographed. Scope In: 8:58:48 AM Scope Out: 9:19:01 AM Scope Withdrawal Time: 0 hours 13 minutes 38 seconds  Total Procedure Duration: 0 hours 20 minutes 13 seconds  Findings:                 The perianal and digital rectal examinations were                            normal.                           Patchy minimal inflammation characterized by                            erythema was found in the rectum and in the                            recto-sigmoid colon, with transition to normal                            mucosa at 25 cm from the anal verge. Biopsies were                            taken with a cold forceps for histology. Estimated                            blood loss was minimal.                           Normal mucosa was found in the sigmoid colon                            through the cecum. Biopsies were taken throughout                             the colon with a cold forceps for histology.                            Estimated blood loss was minimal.                           The retroflexed view of the distal rectum and anal                            verge was normal and showed no anal or rectal                            abnormalities.                           The terminal ileum appeared normal. Complications:            No immediate  complications. Estimated Blood Loss:     Estimated blood loss was minimal. Impression:               - Patchy minimal inflammation was found in the                            rectum and in the recto-sigmoid colon secondary to                            proctitis ulcerative colitis. Biopsied.                           - The mucosa was otherwise normal in the sigmoid                            colon through the cecum. Biopsied.                           - The distal rectum and anal verge are normal on                            retroflexion view.                           - The examined portion of the ileum was normal. Recommendation:           - Patient has a contact number available for                            emergencies. The signs and symptoms of potential                            delayed complications were discussed with the                            patient. Return to normal activities tomorrow.                            Written discharge instructions were provided to the                            patient.                           - Resume previous diet.                           - Continue present medications.                           - Await pathology results.                           - Repeat colonoscopy in 5 years for surveillance.                           -  Return to GI office PRN. Doristine Locks, MD 04/02/2023 9:31:04 AM

## 2023-04-02 NOTE — Patient Instructions (Signed)

## 2023-04-02 NOTE — Progress Notes (Signed)
GASTROENTEROLOGY PROCEDURE H&P NOTE   Primary Care Physician: Pearline Cables, MD    Reason for Procedure:  Colon polyp surveillance  Plan:    Colonoscopy  Patient is appropriate for endoscopic procedure(s) in the ambulatory ([facility]) setting.  The nature of the procedure, as well as the risks, benefits, and alternatives were carefully and thoroughly reviewed with the patient. Ample time for discussion and questions allowed. The patient understood, was satisfied, and agreed to proceed.     HPI: [name] Young is a 52 y.o. female who presents for colonoscopy for routine Colon polyp surveillance.    Last Colonoscopy was 03/09/2020 and notable for 3 subcentimeter tubular adenomas, mild ulcerative proctitis with transition to normal mucosa at 20 cm ([facility] 1), normal remainder of the colon (normal biopsies), normal TI. Colitis sxs resolved with Canasa (no longer taking). Presents today for ongoing surveillance.   Had been doing well and no GI sxs since last OV in 2021. Did have a slight flare of sxs last week after dietary indiscretions at a reunion, but sxs only lasted 24-48 hours and no need for restarting mesalamine. Now w/o GI sxs again.   Patient is otherwise without complaints or active issues today.  Past Medical History:  Diagnosis Date   Allergy    Diabetes mellitus    Type II   Family history of breast cancer    Family history of breast cancer    GERD (gastroesophageal reflux disease)     Past Surgical History:  Procedure Laterality Date   WISDOM TOOTH EXTRACTION Bilateral     Prior to Admission medications   Medication Sig Start Date End Date Taking? Authorizing Provider  acyclovir (ZOVIRAX) 400 MG tablet Take 2 tablets (800 mg total) by mouth 2 (two) times daily as needed. 12/22/22 04/11/23  Copland, Gwenlyn Found, MD  cetirizine (ZYRTEC) 10 MG tablet Take 10 mg by mouth daily.    [provider]  Continuous Glucose Sensor (DEXCOM G7 SENSOR) MISC Use as  directed 06/15/22   Carlus Pavlov, MD  empagliflozin (JARDIANCE) 25 MG TABS tablet Take 1 tablet (25 mg total) by mouth daily. 03/26/23   Carlus Pavlov, MD  fluticasone (FLONASE) 50 MCG/ACT nasal spray Place 1 spray into both nostrils at bedtime. 11/01/21   Copland, Gwenlyn Found, MD  insulin glargine-yfgn (SEMGLEE, YFGN,) 100 UNIT/ML Pen Inject 8-10 Units into the skin daily. 08/14/22   Carlus Pavlov, MD  Insulin Lispro-aabc (LYUMJEV KWIKPEN) 100 UNIT/ML KwikPen Inject 6-8 Units into the skin 2 (two) times daily. 12/25/22   Carlus Pavlov, MD  Insulin Pen Needle (UNIFINE PENTIPS) 32G X 4 MM MISC Use once daily. 03/27/22   Carlus Pavlov, MD  Lancets (FREESTYLE) lancets Use to check blood sugar 2 times a day. 11/18/19   Carlus Pavlov, MD  metFORMIN (GLUCOPHAGE) 1000 MG tablet Take 2 tablets (2,000 mg total) by mouth daily with supper. 02/11/23 02/11/24  Carlus Pavlov, MD  methocarbamol (ROBAXIN) 500 MG tablet Take 1 tablet (500 mg total) by mouth every 6 (six) hours as needed for muscle spasms. 03/14/23   Copland, Gwenlyn Found, MD  Multiple Vitamins-Minerals (DIASENSE MULTIVITAMIN) TABS Take 1 tablet by mouth daily.    [provider]  Omeprazole (PRILOSEC PO) Take by mouth as needed.     [provider]  simvastatin (ZOCOR) 20 MG tablet Take 1 tablet (20 mg total) by mouth at bedtime. *need labs for more refills* 11/29/22   Copland, Gwenlyn Found, MD  tirzepatide [facility]) 10 MG/0.5ML Pen Inject  10 mg into the skin once a week. 06/15/22   Carlus Pavlov, MD  tirzepatide [facility]) 10 MG/0.5ML Pen Inject 10 mg into the skin once a week. 08/07/22   Carlus Pavlov, MD  tirzepatide [facility]) 12.5 MG/0.5ML Pen Inject 12.5 mg into the skin once a week. 12/06/21   Carlus Pavlov, MD  vitamin B-12 (CYANOCOBALAMIN) 1000 MCG tablet Take 1,000 mcg by mouth daily.     [provider]  glipiZIDE (GLUCOTROL) 5 MG tablet Take 1 tablet (5 mg total) by mouth daily. 01/26/20 09/28/20   Carlus Pavlov, MD    Current Outpatient Medications  Medication Sig Dispense Refill   acyclovir (ZOVIRAX) 400 MG tablet Take 2 tablets (800 mg total) by mouth 2 (two) times daily as needed. 50 tablet 2   cetirizine (ZYRTEC) 10 MG tablet Take 10 mg by mouth daily.     Continuous Glucose Sensor (DEXCOM G7 SENSOR) MISC Use as directed 9 each 3   empagliflozin (JARDIANCE) 25 MG TABS tablet Take 1 tablet (25 mg total) by mouth daily. 90 tablet 1   fluticasone (FLONASE) 50 MCG/ACT nasal spray Place 1 spray into both nostrils at bedtime. 16 g 6   insulin glargine-yfgn (SEMGLEE, YFGN,) 100 UNIT/ML Pen Inject 8-10 Units into the skin daily. 15 mL 3   Insulin Lispro-aabc (LYUMJEV KWIKPEN) 100 UNIT/ML KwikPen Inject 6-8 Units into the skin 2 (two) times daily. 15 mL 3   Insulin Pen Needle (UNIFINE PENTIPS) 32G X 4 MM MISC Use once daily. 100 each 3   Lancets (FREESTYLE) lancets Use to check blood sugar 2 times a day. 200 each 12   metFORMIN (GLUCOPHAGE) 1000 MG tablet Take 2 tablets (2,000 mg total) by mouth daily with supper. 180 tablet 1   methocarbamol (ROBAXIN) 500 MG tablet Take 1 tablet (500 mg total) by mouth every 6 (six) hours as needed for muscle spasms. 30 tablet 0   Multiple Vitamins-Minerals (DIASENSE MULTIVITAMIN) TABS Take 1 tablet by mouth daily.     Omeprazole (PRILOSEC PO) Take by mouth as needed.      simvastatin (ZOCOR) 20 MG tablet Take 1 tablet (20 mg total) by mouth at bedtime. *need labs for more refills* 90 tablet 3   tirzepatide (MOUNJARO) 10 MG/0.5ML Pen Inject 10 mg into the skin once a week. 6 mL 3   tirzepatide (MOUNJARO) 10 MG/0.5ML Pen Inject 10 mg into the skin once a week. 2 mL 5   tirzepatide (MOUNJARO) 12.5 MG/0.5ML Pen Inject 12.5 mg into the skin once a week. 2 mL 0   vitamin B-12 (CYANOCOBALAMIN) 1000 MCG tablet Take 1,000 mcg by mouth daily.      Current Facility-Administered Medications  Medication Dose Route Frequency Provider Last Rate Last Admin   0.9 %   sodium chloride infusion  500 mL Intravenous Once Sylvi Rybolt V, DO        Allergies as of 04/02/2023 - Review Complete 04/02/2023  Allergen Reaction Noted   Penicillins  07/12/2007   Shingrix [zoster vac recomb adjuvanted]  04/02/2020    Family History  Problem Relation Age of Onset   Diabetes Other        Familly Hx First degree relative   Breast cancer Mother 82   Breast cancer Maternal Aunt 23   Brain cancer Maternal Uncle 45   Colon cancer Neg Hx    Esophageal cancer Neg Hx    Stomach cancer Neg Hx    Rectal cancer Neg Hx     Social History  Socioeconomic History   Marital status: Married    Spouse name: Scot   Number of children: 1   Years of education: Not on file   Highest education level: Not on file  Occupational History   Not on file  Tobacco Use   Smoking status: Never   Smokeless tobacco: Never  Vaping Use   Vaping status: Never Used  Substance and Sexual Activity   Alcohol use: Yes    Alcohol/week: 2.0 standard drinks of alcohol    Types: 2 Standard drinks or equivalent per week    Comment: socially   Drug use: No   Sexual activity: Not on file  Other Topics Concern   Not on file  Social History Narrative   Regular exercise-No   Social Determinants of Health   Financial Resource Strain: Not on file  Food Insecurity: Not on file  Transportation Needs: Not on file  Physical Activity: Not on file  Stress: Not on file  Social Connections: Not on file  Intimate Partner Violence: Not on file    Physical Exam: Vital signs in last 24 hours: @BP  114/68   Pulse 85   Temp 98.2 F (36.8 C)   Ht 5\' 7"  (1.702 m)   Wt 130 lb (59 kg)   LMP 08/26/2021   SpO2 100%   BMI 20.36 kg/m  GEN: NAD EYE: Sclerae anicteric ENT: MMM CV: Non-tachycardic Pulm: CTA b/l GI: Soft, NT/ND NEURO:  Alert & Oriented x 3   Doristine Locks, DO [facility]   04/02/2023 8:45 AM

## 2023-04-02 NOTE — Progress Notes (Signed)
Pt's states no medical or surgical changes since previsit or office visit. 

## 2023-04-02 NOTE — Progress Notes (Signed)
To pacu, VSS. Report to [email or web address]

## 2023-04-03 ENCOUNTER — Telehealth: Payer: Self-pay | Admitting: *Deleted

## 2023-04-03 NOTE — Telephone Encounter (Signed)
No answer on  follow up call. Left message.

## 2023-04-04 LAB — SURGICAL PATHOLOGY

## 2023-04-08 ENCOUNTER — Other Ambulatory Visit: Payer: Self-pay | Admitting: Internal Medicine

## 2023-04-08 DIAGNOSIS — E1165 Type 2 diabetes mellitus with hyperglycemia: Secondary | ICD-10-CM

## 2023-04-09 ENCOUNTER — Other Ambulatory Visit: Payer: Self-pay

## 2023-04-09 ENCOUNTER — Other Ambulatory Visit (HOSPITAL_COMMUNITY): Payer: Self-pay

## 2023-04-09 DIAGNOSIS — E1165 Type 2 diabetes mellitus with hyperglycemia: Secondary | ICD-10-CM

## 2023-04-09 MED ORDER — MOUNJARO 10 MG/0.5ML ~~LOC~~ SOAJ
10.0000 mg | SUBCUTANEOUS | 3 refills | Status: DC
  Filled 2023-04-09: qty 6, 84d supply, fill #0
  Filled 2023-08-25: qty 6, 84d supply, fill #1
  Filled 2023-11-19 – 2023-11-22 (×2): qty 6, 84d supply, fill #2
  Filled 2024-01-24 – 2024-02-09 (×3): qty 6, 84d supply, fill #3

## 2023-04-09 MED ORDER — MOUNJARO 10 MG/0.5ML ~~LOC~~ SOAJ
10.0000 mg | SUBCUTANEOUS | 5 refills | Status: DC
  Filled 2023-04-09 – 2023-06-28 (×2): qty 2, 28d supply, fill #0
  Filled 2023-07-30: qty 2, 28d supply, fill #1

## 2023-04-20 ENCOUNTER — Other Ambulatory Visit (HOSPITAL_COMMUNITY): Payer: Self-pay

## 2023-04-26 ENCOUNTER — Other Ambulatory Visit (HOSPITAL_COMMUNITY): Payer: Self-pay

## 2023-05-02 ENCOUNTER — Other Ambulatory Visit (HOSPITAL_COMMUNITY): Payer: Self-pay

## 2023-05-07 IMAGING — MG MM DIGITAL SCREENING BILAT W/ TOMO AND CAD
8 series · 8 of 24 positions shown · non-contrast
Comparison: Previous exam(s).

CLINICAL DATA: Screening.

EXAM:
DIGITAL SCREENING BILATERAL MAMMOGRAM WITH TOMOSYNTHESIS AND CAD
TECHNIQUE: Bilateral screening digital craniocaudal and mediolateral oblique
mammograms were obtained. Bilateral screening digital breast
tomosynthesis was performed. The images were evaluated with
computer-aided detection.

[R MLO synth-2D]
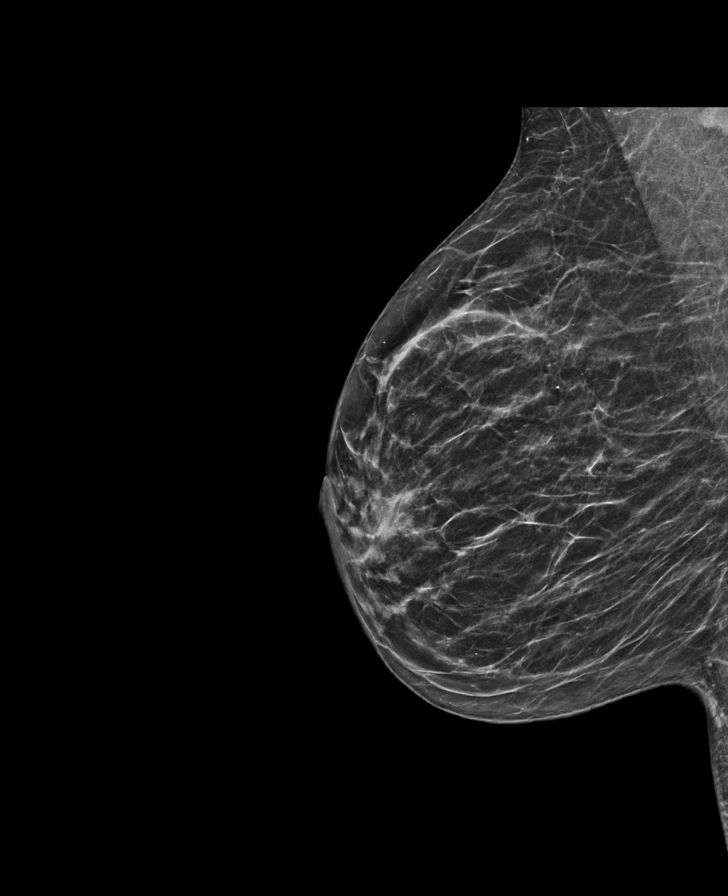

[L CC synth-2D]
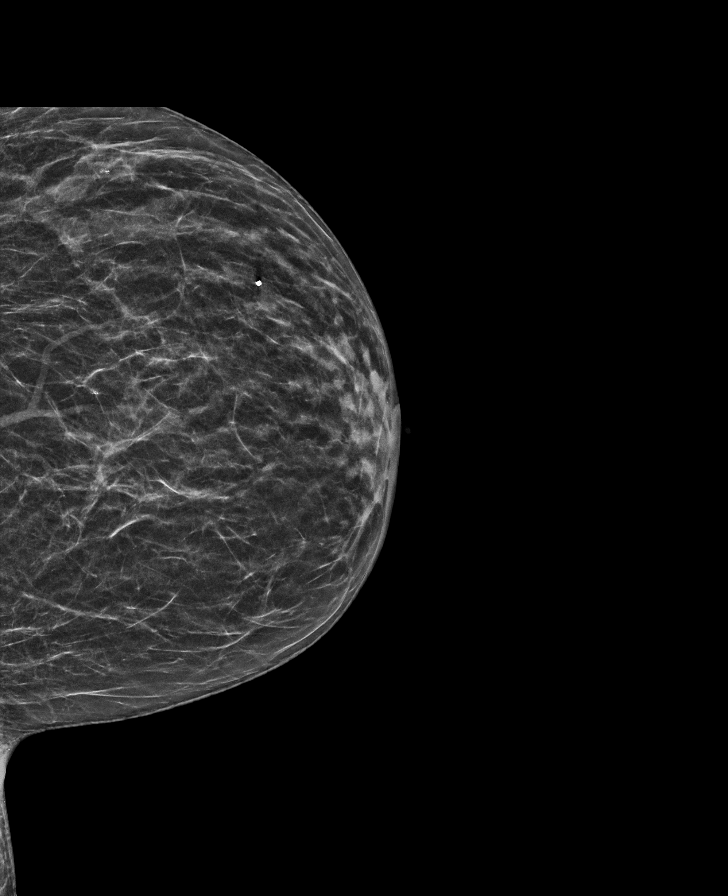

[R CC synth-2D]
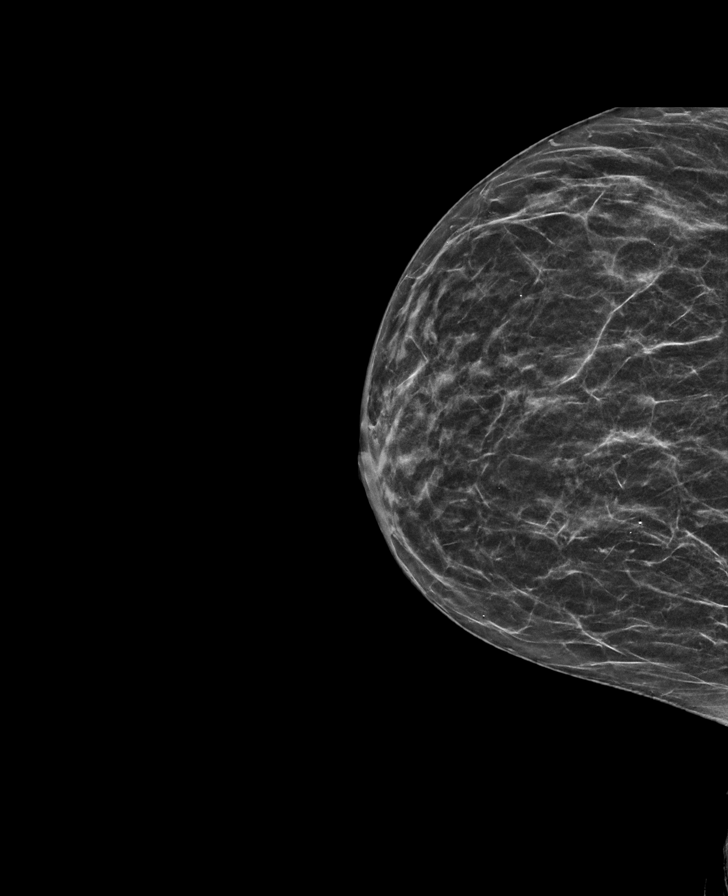

[L MLO synth-2D]
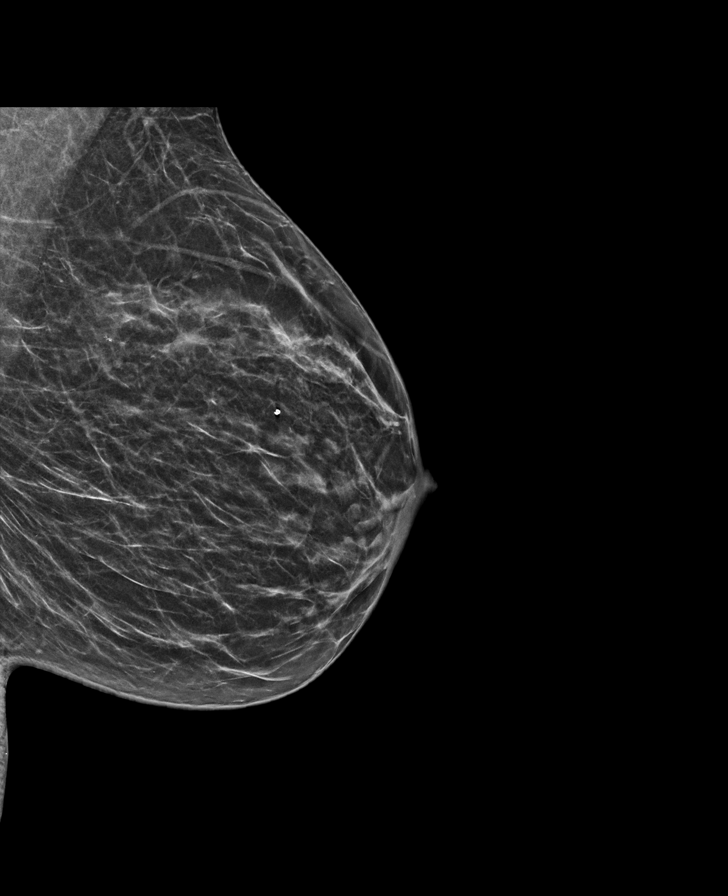

[R CC tomo · tomo slice 25/48.0]
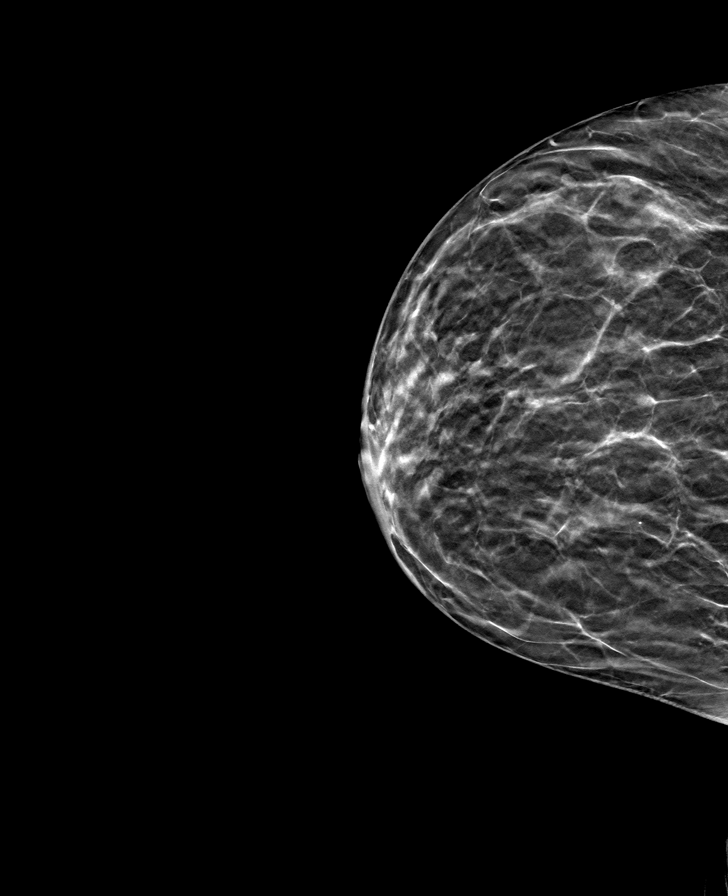

[L CC tomo · tomo slice 25/48.0]
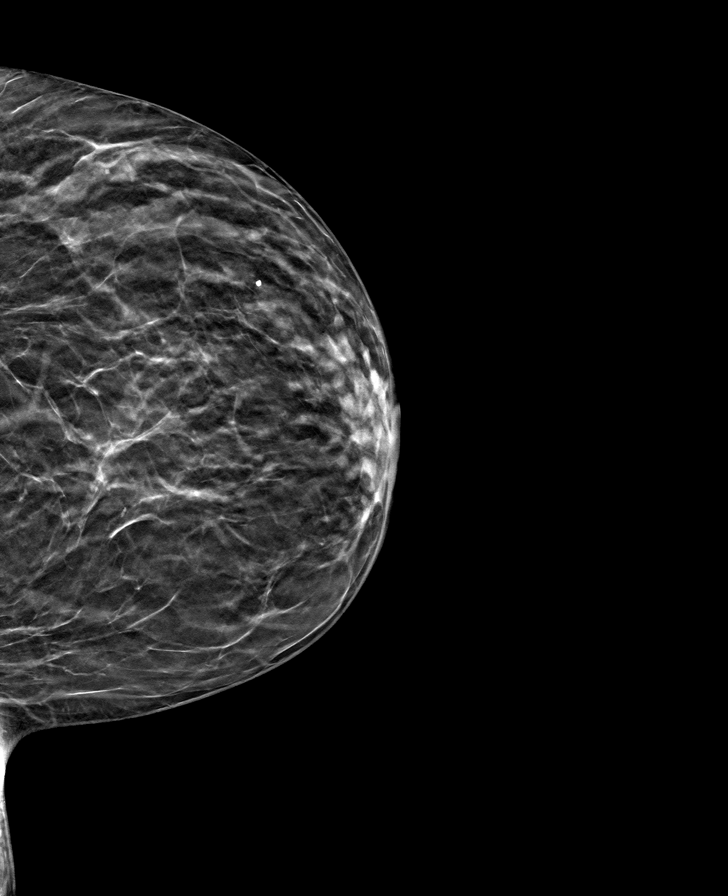

[R MLO tomo · tomo slice 29/56.0]
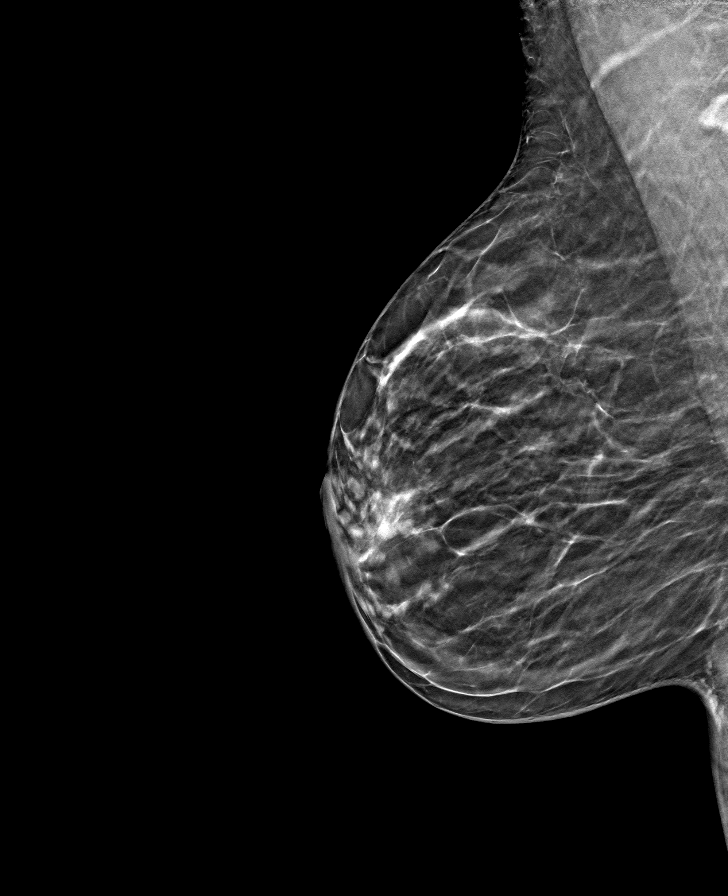

[L MLO tomo · tomo slice 28/55.0]
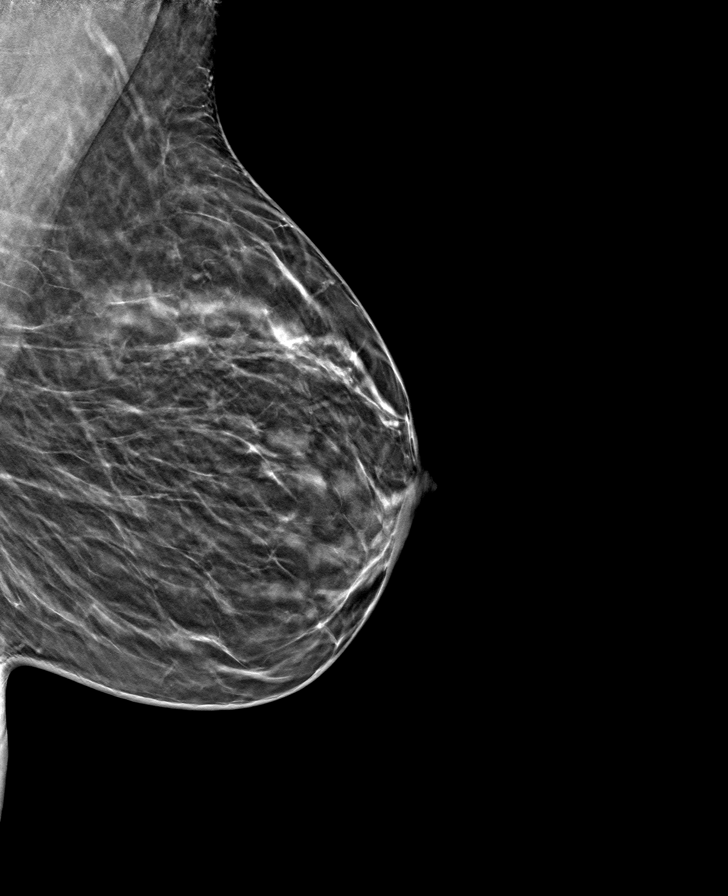

[8 of 24 positions shown; findings below may reference images not displayed]

ACR Breast Density Category c: The breast tissue is heterogeneously
dense, which may obscure small masses.
FINDINGS: There are no findings suspicious for malignancy.
IMPRESSION: No mammographic evidence of malignancy. A result letter of this
screening mammogram will be mailed directly to the patient.

RECOMMENDATION:
Screening mammogram in one year. (Code:Q3-W-BC3)

BI-RADS CATEGORY  1: Negative.

## 2023-05-11 ENCOUNTER — Other Ambulatory Visit: Payer: Self-pay | Admitting: Family Medicine

## 2023-05-11 ENCOUNTER — Other Ambulatory Visit (HOSPITAL_COMMUNITY): Payer: Self-pay

## 2023-05-11 DIAGNOSIS — T7840XD Allergy, unspecified, subsequent encounter: Secondary | ICD-10-CM

## 2023-05-11 MED ORDER — FLUTICASONE PROPIONATE 50 MCG/ACT NA SUSP
1.0000 | Freq: Every evening | NASAL | 6 refills | Status: AC
  Filled 2023-05-11: qty 16, 60d supply, fill #0
  Filled 2023-11-29 – 2023-11-30 (×2): qty 16, 60d supply, fill #1

## 2023-05-15 ENCOUNTER — Other Ambulatory Visit (HOSPITAL_COMMUNITY): Payer: Self-pay

## 2023-06-08 ENCOUNTER — Other Ambulatory Visit (HOSPITAL_COMMUNITY): Payer: Self-pay

## 2023-06-13 ENCOUNTER — Other Ambulatory Visit (HOSPITAL_COMMUNITY): Payer: Self-pay

## 2023-06-21 ENCOUNTER — Other Ambulatory Visit (HOSPITAL_COMMUNITY): Payer: Self-pay

## 2023-06-21 ENCOUNTER — Other Ambulatory Visit: Payer: Self-pay | Admitting: Family Medicine

## 2023-06-21 DIAGNOSIS — B009 Herpesviral infection, unspecified: Secondary | ICD-10-CM

## 2023-06-21 MED ORDER — ACYCLOVIR 400 MG PO TABS
800.0000 mg | ORAL_TABLET | Freq: Two times a day (BID) | ORAL | 2 refills | Status: DC | PRN
  Filled 2023-06-21: qty 50, 14d supply, fill #0
  Filled 2023-08-07: qty 50, 14d supply, fill #1
  Filled 2023-10-03: qty 50, 14d supply, fill #2

## 2023-06-28 ENCOUNTER — Other Ambulatory Visit: Payer: Self-pay | Admitting: Internal Medicine

## 2023-06-28 ENCOUNTER — Other Ambulatory Visit (HOSPITAL_COMMUNITY): Payer: Self-pay

## 2023-06-28 DIAGNOSIS — E1165 Type 2 diabetes mellitus with hyperglycemia: Secondary | ICD-10-CM

## 2023-06-28 MED ORDER — DEXCOM G7 SENSOR MISC
3 refills | Status: DC
  Filled 2023-06-28 – 2023-07-02 (×2): qty 9, 90d supply, fill #0
  Filled 2023-07-05: qty 3, 30d supply, fill #0
  Filled 2023-08-07: qty 3, 30d supply, fill #1
  Filled 2023-09-06: qty 3, 30d supply, fill #2
  Filled 2023-10-08: qty 3, 30d supply, fill #3
  Filled 2023-11-09: qty 3, 30d supply, fill #4
  Filled 2023-12-15: qty 3, 30d supply, fill #5
  Filled 2024-01-24: qty 3, 30d supply, fill #6
  Filled 2024-02-25: qty 3, 30d supply, fill #7
  Filled 2024-04-04: qty 3, 30d supply, fill #8
  Filled 2024-05-01: qty 3, 30d supply, fill #9
  Filled 2024-06-04: qty 3, 30d supply, fill #10

## 2023-07-02 ENCOUNTER — Other Ambulatory Visit: Payer: Self-pay

## 2023-07-02 ENCOUNTER — Ambulatory Visit: Payer: 59 | Admitting: Internal Medicine

## 2023-07-02 ENCOUNTER — Other Ambulatory Visit (HOSPITAL_COMMUNITY): Payer: Self-pay

## 2023-07-05 ENCOUNTER — Other Ambulatory Visit (HOSPITAL_COMMUNITY): Payer: Self-pay

## 2023-07-25 ENCOUNTER — Encounter: Payer: 59 | Admitting: Family Medicine

## 2023-07-25 DIAGNOSIS — R059 Cough, unspecified: Secondary | ICD-10-CM

## 2023-07-25 DIAGNOSIS — J069 Acute upper respiratory infection, unspecified: Secondary | ICD-10-CM

## 2023-07-25 NOTE — Telephone Encounter (Signed)
 Please see the MyChart message reply(ies) for my assessment and plan.  The patient gave consent for this Medical Advice Message and is aware that it may result in a bill to their insurance company as well as the possibility that this may result in a co-payment or deductible. They are an established patient, but are not seeking medical advice exclusively about a problem treated during an in person or video visit in the last 7 days. I did not recommend an in person or video visit within 7 days of my reply.  I spent a total of 15 minutes cumulative time within 7 days through [employer] Abbe Amsterdam, MD

## 2023-07-25 NOTE — Telephone Encounter (Signed)
 Is Tamiflu  still proscribed for prophylactic use?

## 2023-07-31 ENCOUNTER — Other Ambulatory Visit (HOSPITAL_COMMUNITY): Payer: Self-pay

## 2023-08-02 ENCOUNTER — Ambulatory Visit: Payer: 59 | Admitting: Internal Medicine

## 2023-08-08 ENCOUNTER — Other Ambulatory Visit (HOSPITAL_COMMUNITY): Payer: Self-pay

## 2023-08-09 ENCOUNTER — Other Ambulatory Visit (HOSPITAL_COMMUNITY): Payer: Self-pay

## 2023-08-09 ENCOUNTER — Encounter: Payer: Self-pay | Admitting: Internal Medicine

## 2023-08-09 ENCOUNTER — Ambulatory Visit: Payer: 59 | Admitting: Internal Medicine

## 2023-08-09 VITALS — BP 116/60 | HR 90 | Ht 67.0 in | Wt 137.0 lb

## 2023-08-09 DIAGNOSIS — Z7984 Long term (current) use of oral hypoglycemic drugs: Secondary | ICD-10-CM | POA: Diagnosis not present

## 2023-08-09 DIAGNOSIS — E1165 Type 2 diabetes mellitus with hyperglycemia: Secondary | ICD-10-CM

## 2023-08-09 DIAGNOSIS — E782 Mixed hyperlipidemia: Secondary | ICD-10-CM | POA: Diagnosis not present

## 2023-08-09 DIAGNOSIS — Z794 Long term (current) use of insulin: Secondary | ICD-10-CM

## 2023-08-09 DIAGNOSIS — E119 Type 2 diabetes mellitus without complications: Secondary | ICD-10-CM

## 2023-08-09 DIAGNOSIS — Z7985 Long-term (current) use of injectable non-insulin antidiabetic drugs: Secondary | ICD-10-CM | POA: Diagnosis not present

## 2023-08-09 DIAGNOSIS — E538 Deficiency of other specified B group vitamins: Secondary | ICD-10-CM

## 2023-08-09 LAB — POCT GLYCOSYLATED HEMOGLOBIN (HGB A1C): Hemoglobin A1C: 6.7 % — AB (ref 4.0–5.6)

## 2023-08-09 MED ORDER — INSULIN GLARGINE-YFGN 100 UNIT/ML ~~LOC~~ SOPN
8.0000 [IU] | PEN_INJECTOR | Freq: Every day | SUBCUTANEOUS | 3 refills | Status: DC
  Filled 2023-08-09: qty 9, 90d supply, fill #0
  Filled 2023-08-09: qty 9, 84d supply, fill #0
  Filled 2024-01-24: qty 9, 84d supply, fill #1
  Filled 2024-05-01: qty 9, 90d supply, fill #2
  Filled 2024-07-15: qty 9, 90d supply, fill #3

## 2023-08-09 MED ORDER — EMPAGLIFLOZIN 25 MG PO TABS
25.0000 mg | ORAL_TABLET | Freq: Every day | ORAL | 3 refills | Status: AC
  Filled 2023-08-09 – 2023-09-23 (×2): qty 90, 90d supply, fill #0
  Filled 2023-12-18: qty 90, 90d supply, fill #1
  Filled 2024-03-19: qty 90, 90d supply, fill #2
  Filled 2024-06-26: qty 90, 90d supply, fill #3
  Filled 2024-06-26: qty 90, 90d supply, fill #0

## 2023-08-09 MED ORDER — METFORMIN HCL 1000 MG PO TABS
2000.0000 mg | ORAL_TABLET | Freq: Every day | ORAL | 3 refills | Status: AC
  Filled 2023-08-09: qty 180, 90d supply, fill #0
  Filled 2023-11-11: qty 180, 90d supply, fill #1
  Filled 2024-02-09 (×2): qty 180, 90d supply, fill #2
  Filled 2024-05-01: qty 180, 90d supply, fill #3

## 2023-08-09 NOTE — Patient Instructions (Addendum)
Please continue: - Metformin 2000 mg with dinner - Jardiance 25 mg daily - Mounjaro 10 mg weekly - Semglee 8 units in am  Use: - Lyumjev 6-8 units before a larger dinner  Please return in 4-6 months.

## 2023-08-09 NOTE — Progress Notes (Signed)
Patient ID: [name] Young, female   DOB: [birth year], 53 y.o.   MRN: [id number]  HPI: [name] Young is a 53 y.o.-year-old female, presenting for f/u for DM2, dx in 2008 (GDM 1998), insulin-dependent, uncontrolled, without long-term complications. Last visit 6 months ago.  Interim history: No increased urination, blurry vision, nausea, chest pain. She mentions gaining weight since last visit (7 pounds) during the holidays and dedication afterwards.  DM2: Reviewed HbA1c levels: Lab Results  Component Value Date   HGBA1C 7.0 (H) 11/29/2022   HGBA1C 6.6 (A) 06/15/2022   HGBA1C 6.3 (A) 12/08/2021   HGBA1C 7.1 (A) 08/05/2021   HGBA1C 6.9 (A) 05/03/2021   HGBA1C 8.9 (H) 12/29/2020   HGBA1C 7.3 (A) 09/28/2020   HGBA1C 7.0 (A) 05/31/2020   HGBA1C 7.5 (H) 01/05/2020   HGBA1C 6.8 (A) 06/04/2019   HGBA1C 7.8 (H) 02/28/2019   HGBA1C 7.6 (A) 10/22/2018   HGBA1C 6.9 (A) 03/22/2018   HGBA1C 6.7 11/19/2017   HGBA1C 6.7 08/21/2017   HGBA1C 6.5 04/20/2017   HGBA1C 6.7 01/19/2017   HGBA1C 6.4 09/07/2016   HGBA1C 6.6 (H) 03/16/2016   HGBA1C 6.5 03/16/2016   HGBA1C 6.5 12/13/2015   HGBA1C 6.4 09/13/2015   HGBA1C 7.6 06/14/2015   HGBA1C 6.8 (H) 03/22/2015   HGBA1C 6.5 03/12/2015   HGBA1C 7.0 (H) 12/10/2014   HGBA1C 7.6 (H) 08/26/2014   HGBA1C 7.5 (H) 05/15/2014   HGBA1C 7.3 (H) 02/09/2014   HGBA1C 7.1 (H) 01/28/2013   Since she was dx with DM >> she lost 50 lbs and was able to come off Lantus.  She is on: - Metformin 2000 mg with dinner - Jardiance 25 mg daily - Lantus 8 units at bedtime - added 07/2021 >> with dinner >> Semglee 8 units in am - Lyumjev  8  >>  - NOT using - Mounjaro 10 mg weekly She was on Trulicity and Ozempic 1 mg prev. She was on Glipizide.  Pt checks her sugars >4 times a day now with a Dexcom CGM:   Previously:  Prev.:   Lowest sugar was 49 (alcohol) >> ...  58 (alcohol) >> 70 >> 60s >> ; she has hypoglycemia awareness in the 60s Highest sugar was   270s >> 200s >> 317 >> 200s.  Glucometer: [employer] >> [employer]  Pt's meals are: - Breakfast: Eggs or oatmeal >> bacon and eggs - Lunch: Salad or veggies or tuna salad with saltines or chicken tenders - Dinner: Fast food -take out - Snacks: Graham crackers  -No CKD, last BUN/creatinine:  Lab Results  Component Value Date   BUN 19 11/29/2022   CREATININE 1.01 11/29/2022   ACR normal.  Previously on lisinopril but now off. Lab Results  Component Value Date   MICRALBCREAT 1.4 11/29/2022   MICRALBCREAT 0.6 02/13/2022   MICRALBCREAT 1.8 09/28/2020   MICRALBCREAT 1.7 01/05/2020   MICRALBCREAT 0.8 10/22/2018   MICRALBCREAT 0.6 04/20/2017   MICRALBCREAT 0.7 03/16/2016   MICRALBCREAT 0.7 03/22/2015   MICRALBCREAT 0.7 10/27/2014   MICRALBCREAT 0.1 02/09/2014   -+ HL; last set of lipids: Lab Results  Component Value Date   CHOL 158 11/29/2022   HDL 69.00 11/29/2022   LDLCALC 72 11/29/2022   LDLDIRECT 62.0 03/22/2015   TRIG 89.0 11/29/2022   CHOLHDL 2 11/29/2022  On Zocor 20.  - last eye exam was on 12/14/2022: reportedly No [facility].   -She has numbness and tingling in her feet.  Last foot exam 11/29/2022.  Vit  B12 def. -She continues on B complex and 1000 mcg B12 daily  Review vitamin B12 levels: Lab Results  Component Value Date   VITAMINB12 1,361 (H) 11/29/2022   VITAMINB12 1,034 (H) 02/13/2022   VITAMINB12 963 (H) 09/28/2020   VITAMINB12 490 01/05/2020   VITAMINB12 1,216 (H) 02/28/2019   VITAMINB12 890 10/22/2018   VITAMINB12 909 12/05/2017   VITAMINB12 782 01/24/2017   VITAMINB12 969 (H) 03/16/2016   VITAMINB12 503 09/13/2015   ROS: + see HPI  I reviewed pt's medications, allergies, PMH, social hx, family hx, and changes were documented in the history of present illness. Otherwise, unchanged from my initial visit note.  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 50 tablet 2   cetirizine (ZYRTEC) 10 MG tablet Take 10 mg by mouth daily.     Continuous Glucose Sensor (DEXCOM G7 SENSOR) MISC Use as directed for continuous glucose monitoring, changing the sensor every 10 days. 9 each 3   empagliflozin (JARDIANCE) 25 MG TABS tablet Take 1 tablet (25 mg total) by mouth daily. 90 tablet 1   fluticasone (FLONASE) 50 MCG/ACT nasal spray Place 1 spray into both nostrils at bedtime. 16 g 6   insulin glargine-yfgn (SEMGLEE, YFGN,) 100 UNIT/ML Pen Inject 8-10 Units into the skin daily. 15 mL 3   Insulin Lispro-aabc (LYUMJEV KWIKPEN) 100 UNIT/ML KwikPen Inject 6-8 Units into the skin 2 (two) times daily. (Patient not taking: Reported on 04/02/2023) 15 mL 3   Insulin Pen Needle (UNIFINE PENTIPS) 32G X 4 MM MISC Use once daily. 100 each 3   Lancets (FREESTYLE) lancets Use to check blood sugar 2 times a day. 200 each 12   metFORMIN (GLUCOPHAGE) 1000 MG tablet Take 2 tablets (2,000 mg total) by mouth daily with supper. 180 tablet 1   methocarbamol (ROBAXIN) 500 MG tablet Take 1 tablet (500 mg total) by mouth every 6 (six) hours as needed for muscle spasms. 30 tablet 0   Multiple Vitamins-Minerals (DIASENSE MULTIVITAMIN) TABS Take 1 tablet by mouth daily.     Omeprazole (PRILOSEC PO)  Take by mouth as needed.      simvastatin (ZOCOR) 20 MG tablet Take 1 tablet (20 mg total) by mouth at bedtime. *need labs for more refills* 90 tablet 3   tirzepatide (MOUNJARO) 10 MG/0.5ML Pen Inject 10 mg into the skin once a week. 2 mL 5   tirzepatide (MOUNJARO) 10 MG/0.5ML Pen Inject 10 mg into the skin once a week. 6 mL 3   tirzepatide (MOUNJARO) 12.5 MG/0.5ML Pen Inject 12.5 mg into the  skin once a week. (Patient not taking: Reported on 04/02/2023) 2 mL 0   vitamin B-12 (CYANOCOBALAMIN) 1000 MCG tablet Take 1,000 mcg by mouth daily.      [DISCONTINUED] glipiZIDE (GLUCOTROL) 5 MG tablet Take 1 tablet (5 mg total) by mouth daily. 90 tablet 3   No current facility-administered medications on file prior to visit.   Allergies  Allergen Reactions   Penicillins     REACTION: RASH   Shingrix [Zoster Vac Recomb Adjuvanted]     Patient developed itching at site a couple of days after shot.  Decided not to administer second dose   Family History  Problem Relation Age of Onset   Diabetes Other        Familly Hx First degree relative   Breast cancer Mother 29   Breast cancer Maternal Aunt 63   Brain cancer Maternal Uncle 38   Colon cancer Neg Hx    Esophageal cancer Neg Hx    Stomach cancer Neg Hx    Rectal cancer Neg Hx    PE: BP 116/60   Pulse 90   Ht 5\' 7"  (1.702 m)   Wt 137 lb (62.1 kg)   LMP 08/26/2021   SpO2 98%   BMI 21.46 kg/m     Wt Readings from Last 3 Encounters:  08/09/23 137 lb (62.1 kg)  04/02/23 130 lb (59 kg)  03/08/23 130 lb (59 kg)   Constitutional: normal weight, in NAD Eyes:  EOMI, no exophthalmos ENT: no neck masses, no cervical lymphadenopathy Cardiovascular: RRR, No MRG Respiratory: CTA B Musculoskeletal: no deformities Skin:no rashes Neurological: no tremor with outstretched hands  ASSESSMENT: 1. DM2, insulin-dependent, uncontrolled, without long term complications, but with hyperglycemia  2. B12 deficiency  3. HL  PLAN:  1. Patient with  longstanding, uncontrolled, type 2 diabetes, on a complex medication regimen including metformin, SGLT2 inhibitor, basal-bolus insulin regimen and weekly GLP-1/GIP receptor agonist, with borderline diabetes control.  Latest HbA1c 7.0%, prior, obtained at last visit.  At that time, sugars were fluctuating mainly within the target range with occasional hyperglycemic exceptions after 3-4 PM.  Some of these were quite high, in the 300s, due to Cheetos.  I advised her to stop these but we did not change her regimen.  We also discussed about the beneficial effect of stopping diet sodas on her gut microbiome -at that time, she mentioned that she stopped diet sodas due to dental erosions. -We could consider a mechanical pump (for example CeQur) or regular insulin pump for her in the future. CGM interpretation: -At today's visit, we reviewed her CGM downloads: It appears that 83% of values are in target range (goal >70%), while 70% are higher than 180 (goal <25%), and 0% are lower than 70 (goal <4%).  The calculated average blood sugar is 140.  The projected HbA1c for the next 3 months (GMI) is 6.7%. -Reviewing the CGM trends, sugars appear to be improved in the last 2 weeks compared to the previous 2 weeks.  This is likely due to the holidays and then being indication.  They are fluctuating within the target range during the day but after dinner, they do increase occasionally above 180.  Upon questioning, she is not taking Lyumjev.  We discussed that she will need to take this before larger meals.  Discussed about examples.  Otherwise, she does not need a change in regimen.  I refilled her outstanding diabetic prescriptions today. - I suggested to:  Patient Instructions  Please continue: -  Metformin 2000 mg with dinner - Jardiance 25 mg daily - Mounjaro 10 mg weekly - Semglee 8 units in am  Use: - Lyumjev 6-8 units before a larger dinner  Please return in 4-6 months.  - we checked her HbA1c: 6.7%  (lower) - advised to check sugars at different times of the day - 4x a day, rotating check times - advised for yearly eye exams >> she is UTD - return to clinic in 4-6 months  2. B12 deficiency -B12 was slightly high: Lab Results  Component Value Date   VITAMINB12 1,361 (H) 11/29/2022  -She is on 1000 mcg B12 daily and a B complex -further mngm per PCP  3.  HL -Latest lipid panel was at goal: Lab Results  Component Value Date   CHOL 158 11/29/2022   HDL 69.00 11/29/2022   LDLCALC 72 11/29/2022   LDLDIRECT 62.0 03/22/2015   TRIG 89.0 11/29/2022   CHOLHDL 2 11/29/2022  -She is on Zocor 10 mg daily without side effects  Carlus Pavlov, MD PhD [facility]

## 2023-08-17 ENCOUNTER — Encounter: Payer: Self-pay | Admitting: Family Medicine

## 2023-08-17 ENCOUNTER — Telehealth: Payer: Self-pay | Admitting: Family Medicine

## 2023-08-17 NOTE — Telephone Encounter (Signed)
 Not sure what to do with this

## 2023-08-17 NOTE — Telephone Encounter (Signed)
 I responded to patient.  This seems like too high a charge- did something go wrong?  I think we could also use some clarification about mychart fees in general- will ask for education for all of us  on this matter

## 2023-08-17 NOTE — Telephone Encounter (Signed)
 Copied from CRM [id number]. Topic: General - Billing Inquiry >> Aug 16, 2023  5:15 PM Laurier C wrote: Reason for CRM: Patient was recently billed for $74.00 and would like clarification on why she recv'd the bill when she simply asked the provider a ? Via my chart. Patient states she was advised by her insurance company to call her providers office.

## 2023-08-17 NOTE — Telephone Encounter (Signed)
 Are you able to help with this?

## 2023-08-27 ENCOUNTER — Other Ambulatory Visit (HOSPITAL_COMMUNITY): Payer: Self-pay

## 2023-09-06 ENCOUNTER — Other Ambulatory Visit (HOSPITAL_COMMUNITY): Payer: Self-pay

## 2023-09-07 ENCOUNTER — Other Ambulatory Visit: Payer: Self-pay

## 2023-09-07 ENCOUNTER — Other Ambulatory Visit (HOSPITAL_COMMUNITY): Payer: Self-pay

## 2023-09-12 ENCOUNTER — Other Ambulatory Visit (HOSPITAL_COMMUNITY): Payer: Self-pay

## 2023-09-24 ENCOUNTER — Other Ambulatory Visit (HOSPITAL_COMMUNITY): Payer: Self-pay

## 2023-10-05 ENCOUNTER — Other Ambulatory Visit (HOSPITAL_COMMUNITY): Payer: Self-pay

## 2023-10-08 ENCOUNTER — Other Ambulatory Visit (HOSPITAL_COMMUNITY): Payer: Self-pay

## 2023-11-09 ENCOUNTER — Other Ambulatory Visit: Payer: Self-pay

## 2023-11-12 ENCOUNTER — Other Ambulatory Visit (HOSPITAL_COMMUNITY): Payer: Self-pay

## 2023-11-14 ENCOUNTER — Other Ambulatory Visit (HOSPITAL_COMMUNITY): Payer: Self-pay

## 2023-11-20 ENCOUNTER — Encounter: Payer: Self-pay | Admitting: Pharmacist

## 2023-11-20 ENCOUNTER — Other Ambulatory Visit: Payer: Self-pay

## 2023-11-22 ENCOUNTER — Other Ambulatory Visit: Payer: Self-pay

## 2023-11-22 ENCOUNTER — Other Ambulatory Visit (HOSPITAL_COMMUNITY): Payer: Self-pay

## 2023-11-22 ENCOUNTER — Other Ambulatory Visit: Payer: Self-pay | Admitting: Family Medicine

## 2023-11-22 DIAGNOSIS — S29019A Strain of muscle and tendon of unspecified wall of thorax, initial encounter: Secondary | ICD-10-CM

## 2023-11-22 MED ORDER — METHOCARBAMOL 500 MG PO TABS
500.0000 mg | ORAL_TABLET | Freq: Four times a day (QID) | ORAL | 0 refills | Status: DC | PRN
  Filled 2023-11-22: qty 30, 8d supply, fill #0

## 2023-11-29 ENCOUNTER — Other Ambulatory Visit: Payer: Self-pay | Admitting: Family Medicine

## 2023-11-29 DIAGNOSIS — E782 Mixed hyperlipidemia: Secondary | ICD-10-CM

## 2023-11-29 DIAGNOSIS — B009 Herpesviral infection, unspecified: Secondary | ICD-10-CM

## 2023-11-30 ENCOUNTER — Other Ambulatory Visit: Payer: Self-pay

## 2023-11-30 ENCOUNTER — Other Ambulatory Visit (HOSPITAL_COMMUNITY): Payer: Self-pay

## 2023-11-30 MED ORDER — SIMVASTATIN 20 MG PO TABS
20.0000 mg | ORAL_TABLET | Freq: Every day | ORAL | 3 refills | Status: AC
  Filled 2023-11-30 (×2): qty 90, 90d supply, fill #0
  Filled 2024-02-25: qty 90, 90d supply, fill #1
  Filled 2024-03-13 – 2024-06-18 (×3): qty 90, 90d supply, fill #2

## 2023-11-30 MED ORDER — ACYCLOVIR 400 MG PO TABS
800.0000 mg | ORAL_TABLET | Freq: Two times a day (BID) | ORAL | 2 refills | Status: AC | PRN
Start: 2023-11-30 — End: 2024-03-27
  Filled 2023-11-30 (×2): qty 50, 13d supply, fill #0
  Filled 2024-03-13: qty 50, 13d supply, fill #1
  Filled 2024-06-27: qty 50, 13d supply, fill #0

## 2023-12-17 ENCOUNTER — Other Ambulatory Visit (HOSPITAL_COMMUNITY): Payer: Self-pay

## 2023-12-18 ENCOUNTER — Other Ambulatory Visit (HOSPITAL_COMMUNITY): Payer: Self-pay

## 2023-12-20 ENCOUNTER — Encounter: Admitting: Family Medicine

## 2023-12-28 NOTE — Progress Notes (Unsigned)
 [facility] [address], [address] [place], [place] [address] [phone]  Date:  01/02/2024   Name:  [name] Young   DOB:  26-Dec-1970   MRN:  [id number]  PCP:  Watt Harlene BROCKS, MD    Chief Complaint: No chief complaint on file.   History of Present Illness:  [name] Young is a 53 y.o. very pleasant female patient who presents with the following:  Pt seen today for CPE Last seen by myself in May of last year   History of controlled diabetes, hyperlipidemia, GERD Her diabetes is managed by endocrinology, Dr. Trixie  She came off OCP last year, [facility] confirmed menopause  Due to update labs   Shingrix- did she get 2nd dose? Foot exam due Eye exam Needs urine micro  Mammo due Pap UTD Colon UTD  Patient Active Problem List   Diagnosis Date Noted   Elevated blood pressure reading 03/20/2017   Genetic testing 06/26/2016   Family history of breast cancer    Type 2 diabetes mellitus with hyperglycemia (HCC) 09/13/2015   Nonrheumatic mitral valve prolapse 03/29/2015   Vitamin B12 deficiency 03/12/2015   Dysplastic nevus of trunk 03/05/2012   HSV (herpes simplex virus) infection 02/06/2012   DUB (dysfunctional uterine bleeding) 02/06/2012   Dysplastic nevus of lower extremity, right 12/12/2011   Hyperlipidemia 10/23/2008   Allergy 07/10/2007    Past Medical History:  Diagnosis Date   Allergy    Diabetes mellitus    Type II   Family history of breast cancer    Family history of breast cancer    GERD (gastroesophageal reflux disease)     Past Surgical History:  Procedure Laterality Date   WISDOM TOOTH EXTRACTION Bilateral     Social History   Tobacco Use   Smoking status: Never   Smokeless tobacco: Never  Vaping Use   Vaping status: Never Used  Substance Use Topics   Alcohol use: Yes    Alcohol/week: 2.0 standard drinks of alcohol    Types: 2 Standard drinks or equivalent per week    Comment:  socially   Drug use: No    Family History  Problem Relation Age of Onset   Diabetes Other        Familly Hx First degree relative   Breast cancer Mother 59   Breast cancer Maternal Aunt 64   Brain cancer Maternal Uncle 25   Colon cancer Neg Hx    Esophageal cancer Neg Hx    Stomach cancer Neg Hx    Rectal cancer Neg Hx     Allergies  Allergen Reactions   Penicillins     REACTION: RASH   Shingrix [Zoster Vac Recomb Adjuvanted]     Patient developed itching at site a couple of days after shot.  Decided not to administer second dose    Medication list has been reviewed and updated.  Current Outpatient Medications on File Prior to Visit  Medication Sig Dispense Refill   acyclovir  (ZOVIRAX ) 400 MG tablet Take 2 tablets (800 mg total) by mouth 2 (two) times daily as needed. 50 tablet 2   cetirizine (ZYRTEC) 10 MG tablet Take 10 mg by mouth daily.     Continuous Glucose Sensor (DEXCOM G7 SENSOR) MISC Use as directed for continuous glucose monitoring, changing the sensor every 10 days. 9 each 3   empagliflozin  (JARDIANCE ) 25 MG TABS tablet Take 1 tablet (25 mg total) by mouth daily. 90  tablet 3   fluticasone  (FLONASE ) 50 MCG/ACT nasal spray Place 1 spray into both nostrils at bedtime. 16 g 6   insulin  glargine-yfgn (SEMGLEE , YFGN,) 100 UNIT/ML Pen Inject 8-10 Units into the skin daily. 15 mL 3   Insulin  Lispro-aabc (LYUMJEV  KWIKPEN) 100 UNIT/ML KwikPen Inject 6-8 Units into the skin 2 (two) times daily. 15 mL 3   Insulin  Pen Needle (UNIFINE PENTIPS) 32G X 4 MM MISC Use once daily. 100 each 3   Lancets (FREESTYLE) lancets Use to check blood sugar 2 times a day. 200 each 12   metFORMIN  (GLUCOPHAGE ) 1000 MG tablet Take 2 tablets (2,000 mg total) by mouth daily with supper. 180 tablet 3   methocarbamol  (ROBAXIN ) 500 MG tablet Take 1 tablet (500 mg total) by mouth every 6 (six) hours as needed for muscle spasms. 30 tablet 0   Multiple Vitamins-Minerals (DIASENSE MULTIVITAMIN) TABS Take 1  tablet by mouth daily.     Omeprazole (PRILOSEC PO) Take by mouth as needed.      simvastatin  (ZOCOR ) 20 MG tablet Take 1 tablet (20 mg total) by mouth at bedtime. *need labs for more refills* 90 tablet 3   tirzepatide  (MOUNJARO ) 10 MG/0.5ML Pen Inject 10 mg into the skin once a week. 6 mL 3   tirzepatide  (MOUNJARO ) 12.5 MG/0.5ML Pen Inject 12.5 mg into the skin once a week. 2 mL 0   vitamin B-12 (CYANOCOBALAMIN ) 1000 MCG tablet Take 1,000 mcg by mouth daily.      [DISCONTINUED] glipiZIDE  (GLUCOTROL ) 5 MG tablet Take 1 tablet (5 mg total) by mouth daily. (Patient not taking: Reported on 08/09/2023) 90 tablet 3   No current facility-administered medications on file prior to visit.    Review of Systems:  As per HPI- otherwise negative.   Physical Examination: There were no vitals filed for this visit. There were no vitals filed for this visit. There is no height or weight on file to calculate BMI. Ideal Body Weight:    GEN: no acute distress. HEENT: Atraumatic, Normocephalic.  Ears and Nose: No external deformity. CV: RRR, No M/G/R. No JVD. No thrill. No extra heart sounds. PULM: CTA B, no wheezes, crackles, rhonchi. No retractions. No resp. distress. No accessory muscle use. ABD: S, NT, ND, +BS. No rebound. No HSM. EXTR: No c/c/e PSYCH: Normally interactive. Conversant.  Foot exam:   Assessment and Plan: *** Physical exam today- encouraged healthy diet and exercise routine Will plan further follow- up pending labs.' Signed Harlene Schroeder, MD

## 2023-12-28 NOTE — Patient Instructions (Incomplete)
Good to see you again today!  I will be in touch with your labs   

## 2024-01-02 ENCOUNTER — Ambulatory Visit: Admitting: Family Medicine

## 2024-01-02 ENCOUNTER — Encounter: Payer: Self-pay | Admitting: Family Medicine

## 2024-01-02 VITALS — BP 112/62 | HR 88 | Temp 98.2°F | Ht 66.0 in | Wt 134.0 lb

## 2024-01-02 DIAGNOSIS — Z1329 Encounter for screening for other suspected endocrine disorder: Secondary | ICD-10-CM

## 2024-01-02 DIAGNOSIS — E538 Deficiency of other specified B group vitamins: Secondary | ICD-10-CM | POA: Diagnosis not present

## 2024-01-02 DIAGNOSIS — Z23 Encounter for immunization: Secondary | ICD-10-CM | POA: Diagnosis not present

## 2024-01-02 DIAGNOSIS — Z0000 Encounter for general adult medical examination without abnormal findings: Secondary | ICD-10-CM | POA: Diagnosis not present

## 2024-01-02 DIAGNOSIS — E119 Type 2 diabetes mellitus without complications: Secondary | ICD-10-CM

## 2024-01-02 DIAGNOSIS — Z7984 Long term (current) use of oral hypoglycemic drugs: Secondary | ICD-10-CM | POA: Diagnosis not present

## 2024-01-02 DIAGNOSIS — Z130 Encounter for screening for diseases of the blood and blood-forming organs and certain disorders involving the immune mechanism: Secondary | ICD-10-CM | POA: Diagnosis not present

## 2024-01-02 DIAGNOSIS — E782 Mixed hyperlipidemia: Secondary | ICD-10-CM

## 2024-01-03 ENCOUNTER — Encounter: Payer: Self-pay | Admitting: Family Medicine

## 2024-01-03 LAB — CBC
HCT: 39.3 % (ref 36.0–46.0)
Hemoglobin: 12.8 g/dL (ref 12.0–15.0)
MCHC: 32.5 g/dL (ref 30.0–36.0)
MCV: 89.6 fl (ref 78.0–100.0)
Platelets: 266 10*3/uL (ref 150.0–400.0)
RBC: 4.39 Mil/uL (ref 3.87–5.11)
RDW: 13.4 % (ref 11.5–15.5)
WBC: 5.6 10*3/uL (ref 4.0–10.5)

## 2024-01-03 LAB — COMPREHENSIVE METABOLIC PANEL WITH GFR
ALT: 23 U/L (ref 0–35)
AST: 17 U/L (ref 0–37)
Albumin: 4.4 g/dL (ref 3.5–5.2)
Alkaline Phosphatase: 87 U/L (ref 39–117)
BUN: 17 mg/dL (ref 6–23)
CO2: 32 meq/L (ref 19–32)
Calcium: 9.6 mg/dL (ref 8.4–10.5)
Chloride: 100 meq/L (ref 96–112)
Creatinine, Ser: 0.71 mg/dL (ref 0.40–1.20)
GFR: 95.69 mL/min (ref >60.00)
Glucose, Bld: 166 mg/dL — ABNORMAL HIGH (ref 70–99)
Potassium: 3.8 meq/L (ref 3.5–5.1)
Sodium: 138 meq/L (ref 135–145)
Total Bilirubin: 0.4 mg/dL (ref 0.2–1.2)
Total Protein: 6.6 g/dL (ref 6.0–8.3)

## 2024-01-03 LAB — MICROALBUMIN / CREATININE URINE RATIO
Creatinine,U: 48.7 mg/dL
Microalb Creat Ratio: UNDETERMINED mg/g (ref 0.0–30.0)
Microalb, Ur: <0.7 mg/dL

## 2024-01-03 LAB — LIPID PANEL
Cholesterol: 158 mg/dL (ref 0–200)
HDL: 67.8 mg/dL (ref >39.00)
LDL Cholesterol: 72 mg/dL (ref 0–99)
NonHDL: 90.19
Total CHOL/HDL Ratio: 2
Triglycerides: 91 mg/dL (ref 0.0–149.0)
VLDL: 18.2 mg/dL (ref 0.0–40.0)

## 2024-01-03 LAB — HEMOGLOBIN A1C: Hgb A1c MFr Bld: 7.4 % — ABNORMAL HIGH (ref 4.6–6.5)

## 2024-01-03 LAB — TSH: TSH: 0.95 u[IU]/mL (ref 0.35–5.50)

## 2024-01-14 DIAGNOSIS — H524 Presbyopia: Secondary | ICD-10-CM | POA: Diagnosis not present

## 2024-01-14 LAB — HM DIABETES EYE EXAM

## 2024-01-15 ENCOUNTER — Ambulatory Visit (INDEPENDENT_AMBULATORY_CARE_PROVIDER_SITE_OTHER): Payer: 59 | Admitting: Internal Medicine

## 2024-01-15 ENCOUNTER — Encounter: Payer: Self-pay | Admitting: Internal Medicine

## 2024-01-15 VITALS — BP 118/64 | HR 96 | Ht 66.0 in | Wt 138.8 lb

## 2024-01-15 DIAGNOSIS — Z7985 Long-term (current) use of injectable non-insulin antidiabetic drugs: Secondary | ICD-10-CM

## 2024-01-15 DIAGNOSIS — E1165 Type 2 diabetes mellitus with hyperglycemia: Secondary | ICD-10-CM

## 2024-01-15 DIAGNOSIS — Z794 Long term (current) use of insulin: Secondary | ICD-10-CM | POA: Diagnosis not present

## 2024-01-15 DIAGNOSIS — E538 Deficiency of other specified B group vitamins: Secondary | ICD-10-CM

## 2024-01-15 DIAGNOSIS — E782 Mixed hyperlipidemia: Secondary | ICD-10-CM | POA: Diagnosis not present

## 2024-01-15 DIAGNOSIS — Z7984 Long term (current) use of oral hypoglycemic drugs: Secondary | ICD-10-CM

## 2024-01-15 NOTE — Patient Instructions (Addendum)
 Please continue: - Metformin  2000 mg with dinner - Jardiance  25 mg daily - Mounjaro  10 mg weekly - Semglee  8 units in am  Start: - Lyumjev  4-8 units before a larger lunch and also before dinner  Look up Relizen.  Please return in 4 months.

## 2024-01-15 NOTE — Progress Notes (Signed)
 Patient ID: [name] Young, female   DOB: 1970/12/06, 53 y.o.   MRN: [id number]  HPI: [name] Young is a 53 y.o.-year-old female, presenting for f/u for DM2, dx in 2008 (GDM 1998), insulin -dependent, uncontrolled, without long-term complications. Last visit 6 months ago.  Interim history: No increased urination, blurry vision, nausea, chest pain.  She has hot flushes -started a supplement with magnesium and vitamin E, which helps.  DM2: Reviewed HbA1c levels: Lab Results  Component Value Date   HGBA1C 7.4 (H) 01/02/2024   HGBA1C 6.7 (A) 08/09/2023   HGBA1C 7.0 (H) 11/29/2022   HGBA1C 6.6 (A) 06/15/2022   HGBA1C 6.3 (A) 12/08/2021   HGBA1C 7.1 (A) 08/05/2021   HGBA1C 6.9 (A) 05/03/2021   HGBA1C 8.9 (H) 12/29/2020   HGBA1C 7.3 (A) 09/28/2020   HGBA1C 7.0 (A) 05/31/2020   HGBA1C 7.5 (H) 01/05/2020   HGBA1C 6.8 (A) 06/04/2019   HGBA1C 7.8 (H) 02/28/2019   HGBA1C 7.6 (A) 10/22/2018   HGBA1C 6.9 (A) 03/22/2018   HGBA1C 6.7 11/19/2017   HGBA1C 6.7 08/21/2017   HGBA1C 6.5 04/20/2017   HGBA1C 6.7 01/19/2017   HGBA1C 6.4 09/07/2016   HGBA1C 6.6 (H) 03/16/2016   HGBA1C 6.5 03/16/2016   HGBA1C 6.5 12/13/2015   HGBA1C 6.4 09/13/2015   HGBA1C 7.6 06/14/2015   HGBA1C 6.8 (H) 03/22/2015   HGBA1C 6.5 03/12/2015   HGBA1C 7.0 (H) 12/10/2014   HGBA1C 7.6 (H) 08/26/2014   HGBA1C 7.5 (H) 05/15/2014   Since she was dx with DM >> she lost 50 lbs and was able to come off Lantus .  She is on: - Metformin  2000 mg with dinner - Jardiance  25 mg daily - Lantus  8 units at bedtime - added 07/2021 >> with dinner >> Semglee  8 units in am - Lyumjev   8  >>  - NOT using >> restarted 6 to 8 units before a larger dinner >> NOT using - Mounjaro  10 mg weekly She was on Trulicity  and Ozempic  1 mg prev. She was on Glipizide . She was previously on Invokana .  Pt checks her sugars >4 times a day now with a Dexcom CGM:  Previously:   Previously:  Lowest sugar was 49 (alcohol) >> ...  58 (alcohol) >>  70 >> 60s >> 61 (? Accurate); she has hypoglycemia awareness in the 60s Highest sugar was 317 >> 200s >> 357  Glucometer: Bayer >> Freestyle Lite  Pt's meals are: - Breakfast: Eggs or oatmeal >> bacon and eggs - Lunch: Salad or veggies or tuna salad with saltines or chicken tenders - Dinner: Fast food -take out - Snacks: Graham crackers  -No CKD, last BUN/creatinine:  Lab Results  Component Value Date   BUN 17 01/02/2024   CREATININE 0.71 01/02/2024   ACR normal.  Previously on lisinopril  but now off. Lab Results  Component Value Date   MICRALBCREAT Unable to calculate 01/02/2024   MICRALBCREAT 4.6 10/21/2009   MICRALBCREAT 11.2 09/18/2006   -+ HL; last set of lipids: Lab Results  Component Value Date   CHOL 158 01/02/2024   HDL 67.80 01/02/2024   LDLCALC 72 01/02/2024   LDLDIRECT 62.0 03/22/2015   TRIG 91.0 01/02/2024   CHOLHDL 2 01/02/2024  On Zocor  20.  - last eye exam was on  01/2024: reportedly No [facility].   -She has numbness and tingling in her feet.  Last foot exam 11/29/2022.  Vit B12 def. -She continues on B complex and 1000 mcg B12 daily  Review vitamin B12  levels: Lab Results  Component Value Date   VITAMINB12 1,361 (H) 11/29/2022   VITAMINB12 1,034 (H) 02/13/2022   VITAMINB12 963 (H) 09/28/2020   VITAMINB12 490 01/05/2020   VITAMINB12 1,216 (H) 02/28/2019   VITAMINB12 890 10/22/2018   VITAMINB12 909 12/05/2017   VITAMINB12 782 01/24/2017   VITAMINB12 969 (H) 03/16/2016   VITAMINB12 503 09/13/2015   ROS: + see HPI  I reviewed pt's medications, allergies, PMH, social hx, family hx, and changes were documented in the history of present illness. Otherwise, unchanged from my initial visit note.  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 50 tablet 2   cetirizine (ZYRTEC) 10 MG tablet Take 10 mg by mouth daily.     Continuous Glucose Sensor (DEXCOM G7 SENSOR) MISC Use as directed for continuous glucose monitoring, changing the sensor every 10 days. 9 each 3   empagliflozin  (JARDIANCE ) 25 MG TABS tablet Take 1 tablet (25 mg total) by mouth daily. 90 tablet 3   fluticasone  (FLONASE ) 50 MCG/ACT nasal spray Place 1 spray into both nostrils at bedtime. 16 g 6   insulin  glargine-yfgn (SEMGLEE , YFGN,) 100 UNIT/ML Pen Inject 8-10 Units into the skin daily. 15 mL 3   Insulin  Lispro-aabc (LYUMJEV  KWIKPEN) 100 UNIT/ML KwikPen Inject 6-8 Units into the skin 2 (two) times daily. 15 mL 3   Insulin  Pen Needle (UNIFINE PENTIPS) 32G X 4 MM MISC Use once daily. 100 each 3   Lancets (FREESTYLE) lancets Use to check blood sugar 2 times a day. 200 each 12   metFORMIN  (GLUCOPHAGE ) 1000 MG tablet Take 2 tablets (2,000 mg total) by mouth daily with supper. 180 tablet 3   methocarbamol  (ROBAXIN ) 500 MG tablet Take 1 tablet (500 mg total) by mouth every 6 (six) hours as needed for muscle spasms. 30 tablet 0   Multiple Vitamins-Minerals (DIASENSE MULTIVITAMIN) TABS Take 1 tablet by mouth daily.     Omeprazole (PRILOSEC PO) Take by mouth as needed.      simvastatin  (ZOCOR ) 20 MG tablet Take 1 tablet (20 mg total) by mouth at bedtime. *need labs for more refills* 90 tablet 3    tirzepatide  (MOUNJARO ) 10 MG/0.5ML Pen Inject 10 mg into the skin once a week. 6 mL 3   tirzepatide  (MOUNJARO ) 12.5 MG/0.5ML Pen Inject 12.5 mg into the skin once a week. 2 mL 0   vitamin B-12 (CYANOCOBALAMIN ) 1000 MCG tablet Take 1,000 mcg by mouth daily.      [DISCONTINUED] glipiZIDE  (GLUCOTROL ) 5 MG tablet Take 1 tablet (5 mg total) by mouth daily. (  Patient not taking: Reported on 08/09/2023) 90 tablet 3   No current facility-administered medications on file prior to visit.   Allergies  Allergen Reactions   Penicillins     REACTION: RASH   Shingrix  [Zoster Vac Recomb Adjuvanted]     Patient developed itching at site a couple of days after shot.  Decided not to administer second dose   Family History  Problem Relation Age of Onset   Diabetes Other        Familly Hx First degree relative   Breast cancer Mother 8   Breast cancer Maternal Aunt 44   Brain cancer Maternal Uncle 37   Colon cancer Neg Hx    Esophageal cancer Neg Hx    Stomach cancer Neg Hx    Rectal cancer Neg Hx    PE: BP 118/64   Pulse 96   Ht 5' 6 (1.676 m)   Wt 138 lb 12.8 oz (63 kg)   LMP 08/26/2021   SpO2 97%   BMI 22.40 kg/m     Wt Readings from Last 3 Encounters:  01/15/24 138 lb 12.8 oz (63 kg)  01/02/24 134 lb (60.8 kg)  08/09/23 137 lb (62.1 kg)   Constitutional: normal weight, in NAD Eyes:  EOMI, no exophthalmos ENT: no neck masses, no cervical lymphadenopathy Cardiovascular: Tachycardia, RR, No MRG Respiratory: CTA B Musculoskeletal: no deformities Skin:no rashes Neurological: no tremor with outstretched hands Diabetic Foot Exam - Simple   Simple Foot Form Diabetic Foot exam was performed with the following findings: Yes 01/15/2024  2:30 PM  Visual Inspection No deformities, no ulcerations, no other skin breakdown bilaterally: Yes Sensation Testing Intact to touch and monofilament testing bilaterally: Yes Pulse Check Posterior Tibialis and Dorsalis pulse intact bilaterally:  Yes Comments    ASSESSMENT: 1. DM2, insulin -dependent, uncontrolled, without long term complications, but with hyperglycemia  2. B12 deficiency  3. HL  PLAN:  1. Patient with longstanding, uncontrolled, type 2 diabetes, on a complex medication regimen with metformin , SGLT2 inhibitor, weekly GLP-1/GIP receptor agonist, along with long-acting insulin  and ultra rapid acting insulin  before a larger dinner, with still poor control.  Latest HbA1c was 7.4% last month.  At last visit, her HbA1c was better, at 6.7%. -At last visit, sugars appeared to be improved in the previous 2 weeks compared to the prior 2 weeks.  They were fluctuating within the target range during the day but after dinner they were increasing occasionally above 180.  Upon questioning, she was not taking Lyumjev  and I advised her to start with larger meals.  We discussed about how to take this and how to vary the dose.  We otherwise did not change the regimen -We could consider a mechanical pump (for example CeQur) or regular insulin  pump for her in the future. CGM interpretation: -At today's visit, we reviewed her CGM downloads: It appears that 72% of values are in target range (goal >70%), while 28% are higher than 180 (goal <25%), and 0% are lower than 70 (goal <4%).  The calculated average blood sugar is 157.  The projected HbA1c for the next 3 months (GMI) is 7.1%. -Reviewing the CGM trends, sugars appear to be fluctuating in the upper target range with increasing blood sugars and occasional quite significant hyperglycemic spikes after lunch and especially after dinner.  Upon questioning, she is not taking Lyumjev  almost at all.  The highest hyperglycemic peaks were with fast food, when she was traveling.  She is trying to avoid eating this.  For now, my recommendation was to add back Lyumjev  and to vary the dose based on the size of the meal.  She may need an insulin  to carb ratio of approximately 1-10, and we discussed about  starting with 4 units of Lyumjev  and increasing the dose as needed.  I advised her to take Lyumjev  always before dinner, but only  before larger lunches.  Will not change the rest of the regimen for now. - I suggested to:  Patient Instructions  Please continue: - Metformin  2000 mg with dinner - Jardiance  25 mg daily - Mounjaro  10 mg weekly - Semglee  8 units in am  Start: - Lyumjev  4-8 units before a larger lunch and also before dinner  Look up Relizen.  Please return in 4 months.  - advised to check sugars at different times of the day - 4x a day, rotating check times - advised for yearly eye exams >> she is UTD - return to clinic in 4 months  2. B12 deficiency -B12 was slightly high: Lab Results  Component Value Date   VITAMINB12 1,361 (H) 11/29/2022  -She is on 1000 mcg B12 daily and a B complex -further mngm per PCP  3.  HL - Latest lipid panel was at goal when checked 2 weeks ago, Lab Results  Component Value Date   CHOL 158 01/02/2024   HDL 67.80 01/02/2024   LDLCALC 72 01/02/2024   LDLDIRECT 62.0 03/22/2015   TRIG 91.0 01/02/2024   CHOLHDL 2 01/02/2024  - She continues Zocor  10 mg daily without side effects  Lela Fendt, MD PhD [facility]

## 2024-01-18 ENCOUNTER — Encounter: Payer: Self-pay | Admitting: Family Medicine

## 2024-01-24 ENCOUNTER — Other Ambulatory Visit: Payer: Self-pay

## 2024-01-24 ENCOUNTER — Other Ambulatory Visit (HOSPITAL_COMMUNITY): Payer: Self-pay

## 2024-01-28 ENCOUNTER — Other Ambulatory Visit (HOSPITAL_COMMUNITY): Payer: Self-pay

## 2024-01-31 ENCOUNTER — Other Ambulatory Visit: Payer: Self-pay | Admitting: Family Medicine

## 2024-01-31 DIAGNOSIS — Z1231 Encounter for screening mammogram for malignant neoplasm of breast: Secondary | ICD-10-CM

## 2024-02-08 ENCOUNTER — Other Ambulatory Visit: Payer: Self-pay | Admitting: Medical Genetics

## 2024-02-09 ENCOUNTER — Other Ambulatory Visit (HOSPITAL_COMMUNITY): Payer: Self-pay

## 2024-02-12 ENCOUNTER — Other Ambulatory Visit: Payer: Self-pay

## 2024-02-12 DIAGNOSIS — Z006 Encounter for examination for normal comparison and control in clinical research program: Secondary | ICD-10-CM

## 2024-02-15 ENCOUNTER — Ambulatory Visit
Admission: RE | Admit: 2024-02-15 | Discharge: 2024-02-15 | Disposition: A | Discharge status: Home / Self Care | Source: Ambulatory Visit | Attending: Family Medicine | Admitting: Family Medicine

## 2024-02-15 DIAGNOSIS — Z1231 Encounter for screening mammogram for malignant neoplasm of breast: Secondary | ICD-10-CM

## 2024-02-22 LAB — GENECONNECT MOLECULAR SCREEN: Genetic Analysis Overall Interpretation: NEGATIVE

## 2024-02-25 ENCOUNTER — Other Ambulatory Visit (HOSPITAL_COMMUNITY): Payer: Self-pay

## 2024-03-13 ENCOUNTER — Other Ambulatory Visit: Payer: Self-pay | Admitting: Family Medicine

## 2024-03-13 DIAGNOSIS — S29019A Strain of muscle and tendon of unspecified wall of thorax, initial encounter: Secondary | ICD-10-CM

## 2024-03-14 ENCOUNTER — Other Ambulatory Visit: Payer: Self-pay

## 2024-03-14 ENCOUNTER — Other Ambulatory Visit (HOSPITAL_COMMUNITY): Payer: Self-pay

## 2024-03-14 MED ORDER — METHOCARBAMOL 500 MG PO TABS
500.0000 mg | ORAL_TABLET | Freq: Four times a day (QID) | ORAL | 0 refills | Status: AC | PRN
  Filled 2024-03-14: qty 30, 8d supply, fill #0

## 2024-03-19 ENCOUNTER — Other Ambulatory Visit (HOSPITAL_COMMUNITY): Payer: Self-pay

## 2024-03-25 ENCOUNTER — Encounter (HOSPITAL_COMMUNITY): Payer: Self-pay

## 2024-03-25 ENCOUNTER — Emergency Department (HOSPITAL_COMMUNITY)
Admission: EM | Admit: 2024-03-25 | Discharge: 2024-03-26 | Disposition: A | Discharge status: Home / Self Care | Attending: Emergency Medicine | Admitting: Emergency Medicine

## 2024-03-25 DIAGNOSIS — D72829 Elevated white blood cell count, unspecified: Secondary | ICD-10-CM | POA: Insufficient documentation

## 2024-03-25 DIAGNOSIS — N3001 Acute cystitis with hematuria: Secondary | ICD-10-CM | POA: Diagnosis not present

## 2024-03-25 DIAGNOSIS — E119 Type 2 diabetes mellitus without complications: Secondary | ICD-10-CM | POA: Diagnosis not present

## 2024-03-25 DIAGNOSIS — Z7984 Long term (current) use of oral hypoglycemic drugs: Secondary | ICD-10-CM | POA: Insufficient documentation

## 2024-03-25 DIAGNOSIS — R319 Hematuria, unspecified: Secondary | ICD-10-CM | POA: Diagnosis present

## 2024-03-25 DIAGNOSIS — Z794 Long term (current) use of insulin: Secondary | ICD-10-CM | POA: Diagnosis not present

## 2024-03-25 LAB — COMPREHENSIVE METABOLIC PANEL WITH GFR
ALT: 17 U/L (ref 0–44)
AST: 21 U/L (ref 15–41)
Albumin: 4.3 g/dL (ref 3.5–5.0)
Alkaline Phosphatase: 95 U/L (ref 38–126)
Anion gap: 15 (ref 5–15)
BUN: 22 mg/dL — ABNORMAL HIGH (ref 6–20)
CO2: 22 mmol/L (ref 22–32)
Calcium: 9.3 mg/dL (ref 8.9–10.3)
Chloride: 102 mmol/L (ref 98–111)
Creatinine, Ser: 0.7 mg/dL (ref 0.44–1.00)
GFR, Estimated: >60 mL/min (ref >60)
Glucose, Bld: 143 mg/dL — ABNORMAL HIGH (ref 70–99)
Potassium: 3.9 mmol/L (ref 3.5–5.1)
Sodium: 139 mmol/L (ref 135–145)
Total Bilirubin: 0.3 mg/dL (ref 0.0–1.2)
Total Protein: 6.7 g/dL (ref 6.5–8.1)

## 2024-03-25 LAB — CBC WITH DIFFERENTIAL/PLATELET
Abs Immature Granulocytes: 0.04 K/uL (ref 0.00–0.07)
Basophils Absolute: 0 K/uL (ref 0.0–0.1)
Basophils Relative: 0 %
Eosinophils Absolute: 0.3 K/uL (ref 0.0–0.5)
Eosinophils Relative: 3 %
HCT: 36.9 % (ref 36.0–46.0)
Hemoglobin: 11.6 g/dL — ABNORMAL LOW (ref 12.0–15.0)
Immature Granulocytes: 0 %
Lymphocytes Relative: 16 %
Lymphs Abs: 1.7 K/uL (ref 0.7–4.0)
MCH: 28.6 pg (ref 26.0–34.0)
MCHC: 31.4 g/dL (ref 30.0–36.0)
MCV: 90.9 fL (ref 80.0–100.0)
Monocytes Absolute: 0.8 K/uL (ref 0.1–1.0)
Monocytes Relative: 7 %
Neutro Abs: 7.8 K/uL — ABNORMAL HIGH (ref 1.7–7.7)
Neutrophils Relative %: 74 %
Platelets: 298 K/uL (ref 150–400)
RBC: 4.06 MIL/uL (ref 3.87–5.11)
RDW: 14.1 % (ref 11.5–15.5)
WBC: 10.6 K/uL — ABNORMAL HIGH (ref 4.0–10.5)
nRBC: 0 % (ref 0.0–0.2)

## 2024-03-25 LAB — URINALYSIS, ROUTINE W REFLEX MICROSCOPIC
Bacteria, UA: NONE SEEN
Bilirubin Urine: NEGATIVE
Glucose, UA: >=500 mg/dL — AB
Ketones, ur: NEGATIVE mg/dL
Nitrite: NEGATIVE
Protein, ur: 100 mg/dL — AB
RBC / HPF: >50 RBC/hpf (ref 0–5)
Specific Gravity, Urine: 1.013 (ref 1.005–1.030)
WBC, UA: >50 WBC/hpf (ref 0–5)
pH: 6 (ref 5.0–8.0)

## 2024-03-25 MED ORDER — CEPHALEXIN 500 MG PO CAPS
500.0000 mg | ORAL_CAPSULE | Freq: Once | ORAL | Status: AC
  Administered 2024-03-26: 500 mg via ORAL
  Filled 2024-03-25: qty 2

## 2024-03-25 NOTE — ED Provider Notes (Signed)
 [place] [facility] DEPARTMENT AT [facility] Provider Note   CSN: [id number] Arrival date & time: 03/25/24  1916     Patient presents with: Hematuria   [name] Young is a 53 y.o. female who presents to the emergency department with a chief complaint of hematuria.  Patient states that after dinner she went to go urinate and was peeing out blood and passing multiple clots.  Prior to this states that she was asymptomatic.  Since that she now states that she is having urinary frequency as well as urgency.  Denies vaginal symptoms, but does appreciate some pelvic pressure.  Denies flank pain.  Denies fever, chills, chest pain, shortness of breath, abdominal pain, nausea, vomiting.  Patient states that she has never had a urinary tract infection previously.  Past medical history significant for type 2 diabetes on Jardiance , hyperlipidemia, etc. Patient denies being on a blood thinning medication. Denies trauma/injury.     Hematuria       Prior to Admission medications   Medication Sig Start Date End Date Taking? Authorizing Provider  cephALEXin  (KEFLEX ) 500 MG capsule Take 1 capsule (500 mg total) by mouth 2 (two) times daily for 5 days. 03/26/24 03/31/24 Yes Genola Yuille F, PA-C  acyclovir  (ZOVIRAX ) 400 MG tablet Take 2 tablets (800 mg total) by mouth 2 (two) times daily as needed. 11/30/23 03/27/24  Copland, Jessica C, MD  cetirizine (ZYRTEC) 10 MG tablet Take 10 mg by mouth daily.    [provider]  Continuous Glucose Sensor (DEXCOM G7 SENSOR) MISC Use as directed for continuous glucose monitoring, changing the sensor every 10 days. 06/28/23   Trixie File, MD  empagliflozin  (JARDIANCE ) 25 MG TABS tablet Take 1 tablet (25 mg total) by mouth daily. 08/09/23   Trixie File, MD  fluticasone  (FLONASE ) 50 MCG/ACT nasal spray Place 1 spray into both nostrils at bedtime. 05/11/23   Copland, Harlene BROCKS, MD  insulin  glargine-yfgn (SEMGLEE , YFGN,) 100 UNIT/ML Pen  Inject 8-10 Units into the skin daily. 08/09/23   Trixie File, MD  Insulin  Lispro-aabc (LYUMJEV  KWIKPEN) 100 UNIT/ML KwikPen Inject 6-8 Units into the skin 2 (two) times daily. 12/25/22   Trixie File, MD  Insulin  Pen Needle (UNIFINE PENTIPS) 32G X 4 MM MISC Use once daily. 03/27/22   Trixie File, MD  Lancets (FREESTYLE) lancets Use to check blood sugar 2 times a day. 11/18/19   Trixie File, MD  metFORMIN  (GLUCOPHAGE ) 1000 MG tablet Take 2 tablets (2,000 mg total) by mouth daily with supper. 08/09/23 08/08/24  Trixie File, MD  methocarbamol  (ROBAXIN ) 500 MG tablet Take 1 tablet (500 mg total) by mouth every 6 (six) hours as needed for muscle spasms. 03/14/24   Copland, Jessica C, MD  Multiple Vitamins-Minerals (DIASENSE MULTIVITAMIN) TABS Take 1 tablet by mouth daily.    [provider]  Omeprazole (PRILOSEC PO) Take by mouth as needed.     [provider]  simvastatin  (ZOCOR ) 20 MG tablet Take 1 tablet (20 mg total) by mouth at bedtime. *need labs for more refills* 11/30/23   Copland, Harlene BROCKS, MD  tirzepatide  (MOUNJARO ) 10 MG/0.5ML Pen Inject 10 mg into the skin once a week. 04/09/23   Trixie File, MD  tirzepatide  (MOUNJARO ) 12.5 MG/0.5ML Pen Inject 12.5 mg into the skin once a week. 12/06/21   Trixie File, MD  vitamin B-12 (CYANOCOBALAMIN ) 1000 MCG tablet Take 1,000 mcg by mouth daily.     [provider]  glipiZIDE  (GLUCOTROL ) 5 MG tablet Take 1 tablet (  5 mg total) by mouth daily. Patient not taking: Reported on 08/09/2023 01/26/20 09/28/20  Trixie File, MD    Allergies: Penicillins and Shingrix  [zoster vac recomb adjuvanted]    Review of Systems  Genitourinary:  Positive for hematuria.    Updated Vital Signs BP 129/80   Pulse 86   Temp 98.3 F (36.8 C) (Oral)   Resp 16   LMP 08/26/2021   SpO2 100%   Physical Exam Vitals and nursing note reviewed.  Constitutional:      General: She is awake. She is not in acute  distress.    Appearance: Normal appearance. She is not ill-appearing, toxic-appearing or diaphoretic.  HENT:     Head: Normocephalic and atraumatic.  Eyes:     General: No scleral icterus. Cardiovascular:     Rate and Rhythm: Normal rate and regular rhythm.  Pulmonary:     Effort: Pulmonary effort is normal. No respiratory distress.     Breath sounds: No wheezing, rhonchi or rales.  Abdominal:     General: Abdomen is flat. There is no distension.     Tenderness: There is abdominal tenderness (Mild subjective tenderness with palpation of suprapubic area). There is no right CVA tenderness, left CVA tenderness, guarding or rebound.  Musculoskeletal:        General: Normal range of motion.     Right lower leg: No edema.     Left lower leg: No edema.  Skin:    General: Skin is warm.     Capillary Refill: Capillary refill takes less than 2 seconds.  Neurological:     General: No focal deficit present.     Mental Status: She is alert and oriented to person, place, and time.  Psychiatric:        Mood and Affect: Mood normal.        Behavior: Behavior normal. Behavior is cooperative.     (all labs ordered are listed, but only abnormal results are displayed) Labs Reviewed  URINALYSIS, ROUTINE W REFLEX MICROSCOPIC - Abnormal; Notable for the following components:      Result Value   Color, Urine RED (*)    APPearance CLOUDY (*)    Glucose, UA >=500 (*)    Hgb urine dipstick MODERATE (*)    Protein, ur 100 (*)    Leukocytes,Ua MODERATE (*)    All other components within normal limits  CBC WITH DIFFERENTIAL/PLATELET - Abnormal; Notable for the following components:   WBC 10.6 (*)    Hemoglobin 11.6 (*)    Neutro Abs 7.8 (*)    All other components within normal limits  COMPREHENSIVE METABOLIC PANEL WITH GFR - Abnormal; Notable for the following components:   Glucose, Bld 143 (*)    BUN 22 (*)    All other components within normal limits  URINE CULTURE     EKG: None  Radiology: No results found.   Procedures   Medications Ordered in the ED  cephALEXin  (KEFLEX ) capsule 500 mg (500 mg Oral Given 03/26/24 0040)                                    Medical Decision Making Amount and/or Complexity of Data Reviewed Labs: ordered.  Risk Prescription drug management.   Patient presents to the ED for concern of hematuria, this involves an extensive number of treatment options, and is a complaint that carries with it a high risk of complications and  morbidity.  The differential diagnosis includes nephrolithiasis, ureterolithiasis, urinary tract infection, pyelonephritis, sepsis, malignancy, trauma/injury, etc.   Co morbidities that complicate the patient evaluation  type 2 diabetes on Jardiance , hyperlipidemia, GERD   Lab Tests:  I Ordered, and personally interpreted labs.  The pertinent results include: CBC reassuring, slightly elevated white blood cell count at 10.6, CMP reassuring no elevated creatinine, urinalysis red in color, cloudy appearance, greater than 500 glucose, moderate hemoglobin on urine dipstick, 100 protein, moderate leukocyte esterase, greater than 50 white blood cells, greater than 50 red blood cells no bacteria seen  Medicines ordered and prescription drug management:  I ordered medication including Keflex  for possible UTI Reevaluation of the patient after these medicines showed that the patient stayed the same I have reviewed the patients home medicines and have made adjustments as needed   Test Considered:  CT abdomen pelvis: Declined at this time as patient denies abdominal pain, stating she has more pelvic pressure along with urinary frequency and urgency, CMP overall reassuring, urinalysis possibly consistent with infection however patient has no CVA tenderness or flank pain, vital signs stable, patient afebrile, low clinical suspicion for acute life-threatening process like pyelonephritis at this  time   Critical Interventions:  None   Problem List / ED Course:  53 year old female, vital signs stable, continued hematuria, no flank pain or other systemic symptoms, mild suprapubic pressure, urinary frequency and urgency On physical exam abdomen nondistended, mild pelvic pressure with palpation Workup today significant for white blood cell count of 10.6, hemoglobin of 11.6, CMP reassuring, urinalysis consistent with blood, glucose, leukocyte esterase moderate, greater than 50 white blood cells, no bacteria seen Based off of history and physical exam low clinical suspicion for kidney stone at this time due to lack of pain and description of symptoms, more suspicious for possible UTI with pelvic pressure, urinary urgency and frequency as well as blood in urine, patient given dose of Keflex  here in the emergency department and prescribed outpatient course I see no indication for admission at this time as patient does not have symptoms consistent with pyelonephritis, no systemic symptoms including fever, vital signs stable Plan is for discharge with outpatient course of Keflex  and follow-up with primary care provider, patient educated that if this is due to an infection and symptoms should improve with the Keflex , if they continue she needs to follow-up with her PCP or return to the emergency department for further evaluation, patient understanding of this Return precautions given Patient discharged Most likely diagnosis at this time is possible urinary tract infection causing hematuria, urine cultured due to history of diabetes   Reevaluation:  After the interventions noted above, I reevaluated the patient and found that they have :stayed the same   Social Determinants of Health:  none   Dispostion:  After consideration of the diagnostic results and the patients response to treatment, I feel that the patent would benefit from discharge and outpatient therapy as described, follow-up  with primary care provider if symptoms persist or worsen, preferably within 48 hours.  If symptoms persist patient can also contact urology as phone number was supplied by myself today and patient states that she has access to their contact information as well      Final diagnoses:  Hematuria, unspecified type  Acute cystitis with hematuria    ED Discharge Orders          Ordered    cephALEXin  (KEFLEX ) 500 MG capsule  2 times daily  03/26/24 0011               Janetta Terrall FALCON, PA-C 03/26/24 [id number]    Charlyn Sora, MD 03/26/24 1843

## 2024-03-25 NOTE — ED Triage Notes (Signed)
 Pt states that after dinner she noticed blood in urine with some clots and dysuria

## 2024-03-25 NOTE — ED Provider Notes (Incomplete)
 [place] EMERGENC[facility] DEPARTMENT AT [facility] Provider Note   CSN: [id number] Arrival date & time: 03/25/24  1916     Patient presents with: Hematuria   [name] Young is a 53 y.o. female who presents emergency department with a chief complaint of hematuria.  Patient states that after dinner she went to go urinate and was peeing out blood and passing multiple clots.  Prior to this states that she was asymptomatic.  Since that she now states that she is having urinary frequency as well as urgency.  Denies vaginal symptoms, but does appreciate some pelvic pressure.  Denies flank pain.  Denies fever, chills, chest pain, shortness of breath, abdominal pain, nausea, vomiting.  Patient states that she has never had a urinary tract infection previously.  Past medical history significant for type 2 diabetes on Jardiance , hyperlipidemia, etc.  {Add pertinent medical, surgical, social history, OB history to HPI:32947}  Hematuria       Prior to Admission medications   Medication Sig Start Date End Date Taking? Authorizing Provider  acyclovir  (ZOVIRAX ) 400 MG tablet Take 2 tablets (800 mg total) by mouth 2 (two) times daily as needed. 11/30/23 03/27/24  Copland, Jessica C, MD  cetirizine (ZYRTEC) 10 MG tablet Take 10 mg by mouth daily.    [provider]  Continuous Glucose Sensor (DEXCOM G7 SENSOR) MISC Use as directed for continuous glucose monitoring, changing the sensor every 10 days. 06/28/23   Trixie File, MD  empagliflozin  (JARDIANCE ) 25 MG TABS tablet Take 1 tablet (25 mg total) by mouth daily. 08/09/23   Trixie File, MD  fluticasone  (FLONASE ) 50 MCG/ACT nasal spray Place 1 spray into both nostrils at bedtime. 05/11/23   Copland, Harlene BROCKS, MD  insulin  glargine-yfgn (SEMGLEE , YFGN,) 100 UNIT/ML Pen Inject 8-10 Units into the skin daily. 08/09/23   Trixie File, MD  Insulin  Lispro-aabc (LYUMJEV  KWIKPEN) 100 UNIT/ML KwikPen Inject 6-8 Units into the skin 2  (two) times daily. 12/25/22   Trixie File, MD  Insulin  Pen Needle (UNIFINE PENTIPS) 32G X 4 MM MISC Use once daily. 03/27/22   Trixie File, MD  Lancets (FREESTYLE) lancets Use to check blood sugar 2 times a day. 11/18/19   Trixie File, MD  metFORMIN  (GLUCOPHAGE ) 1000 MG tablet Take 2 tablets (2,000 mg total) by mouth daily with supper. 08/09/23 08/08/24  Trixie File, MD  methocarbamol  (ROBAXIN ) 500 MG tablet Take 1 tablet (500 mg total) by mouth every 6 (six) hours as needed for muscle spasms. 03/14/24   Copland, Jessica C, MD  Multiple Vitamins-Minerals (DIASENSE MULTIVITAMIN) TABS Take 1 tablet by mouth daily.    [provider]  Omeprazole (PRILOSEC PO) Take by mouth as needed.     [provider]  simvastatin  (ZOCOR ) 20 MG tablet Take 1 tablet (20 mg total) by mouth at bedtime. *need labs for more refills* 11/30/23   Copland, Harlene BROCKS, MD  tirzepatide  (MOUNJARO ) 10 MG/0.5ML Pen Inject 10 mg into the skin once a week. 04/09/23   Trixie File, MD  tirzepatide  (MOUNJARO ) 12.5 MG/0.5ML Pen Inject 12.5 mg into the skin once a week. 12/06/21   Trixie File, MD  vitamin B-12 (CYANOCOBALAMIN ) 1000 MCG tablet Take 1,000 mcg by mouth daily.     [provider]  glipiZIDE  (GLUCOTROL ) 5 MG tablet Take 1 tablet (5 mg total) by mouth daily. Patient not taking: Reported on 08/09/2023 01/26/20 09/28/20  Trixie File, MD    Allergies: Penicillins and Shingrix  [zoster vac recomb adjuvanted]  Review of Systems  Genitourinary:  Positive for hematuria.    Updated Vital Signs BP 129/80 (BP Location: Left Arm)   Pulse 96   Temp 98.3 F (36.8 C) (Oral)   Resp 16   LMP 08/26/2021   SpO2 99%   Physical Exam Vitals and nursing note reviewed.  Constitutional:      General: She is awake. She is not in acute distress.    Appearance: Normal appearance. She is not ill-appearing, toxic-appearing or diaphoretic.  HENT:     Head: Normocephalic and  atraumatic.  Eyes:     General: No scleral icterus. Cardiovascular:     Rate and Rhythm: Normal rate and regular rhythm.  Pulmonary:     Effort: Pulmonary effort is normal. No respiratory distress.     Breath sounds: No wheezing, rhonchi or rales.  Abdominal:     General: Abdomen is flat. There is no distension.     Tenderness: There is abdominal tenderness (Mild subjective tenderness with palpation of suprapubic area). There is no right CVA tenderness, left CVA tenderness, guarding or rebound.  Musculoskeletal:        General: Normal range of motion.     Right lower leg: No edema.     Left lower leg: No edema.  Skin:    General: Skin is warm.     Capillary Refill: Capillary refill takes less than 2 seconds.  Neurological:     General: No focal deficit present.     Mental Status: She is alert and oriented to person, place, and time.  Psychiatric:        Mood and Affect: Mood normal.        Behavior: Behavior normal. Behavior is cooperative.     (all labs ordered are listed, but only abnormal results are displayed) Labs Reviewed  URINALYSIS, ROUTINE W REFLEX MICROSCOPIC - Abnormal; Notable for the following components:      Result Value   Color, Urine RED (*)    APPearance CLOUDY (*)    Glucose, UA >=500 (*)    Hgb urine dipstick MODERATE (*)    Protein, ur 100 (*)    Leukocytes,Ua MODERATE (*)    All other components within normal limits  CBC WITH DIFFERENTIAL/PLATELET - Abnormal; Notable for the following components:   WBC 10.6 (*)    Hemoglobin 11.6 (*)    Neutro Abs 7.8 (*)    All other components within normal limits  URINE CULTURE  COMPREHENSIVE METABOLIC PANEL WITH GFR    EKG: None  Radiology: No results found.  {Document cardiac monitor, telemetry assessment procedure when appropriate:32947} Procedures   Medications Ordered in the ED  cephALEXin  (KEFLEX ) capsule 500 mg (has no administration in time range)      {Click here for ABCD2, HEART and other  calculators REFRESH Note before signing:1}                              Medical Decision Making Amount and/or Complexity of Data Reviewed Labs: ordered.  Risk Prescription drug management.     Patient presents to the ED for concern of ***, this involves an extensive number of treatment options, and is a complaint that carries with it a high risk of complications and morbidity.  The differential diagnosis includes ***   Co morbidities that complicate the patient evaluation  ***   Additional history obtained:  Additional history obtained from *** {Blank multiple:19196::EMS,Family,Nursing,Outside Medical Records,Past Admission}  External records from outside source obtained and reviewed including ***   Lab Tests:  I Ordered, and personally interpreted labs.  The pertinent results include:  ***   Imaging Studies ordered:  I ordered imaging studies including ***  I independently visualized and interpreted imaging which showed *** I agree with the radiologist interpretation   Cardiac Monitoring:  The patient was maintained on a cardiac monitor.  I personally viewed and interpreted the cardiac monitored which showed an underlying rhythm of: ***   Medicines ordered and prescription drug management:  I ordered medication including ***  for ***  Reevaluation of the patient after these medicines showed that the patient {resolved/improved/worsened:23923::improved} I have reviewed the patients home medicines and have made adjustments as needed   Test Considered:  ***   Critical Interventions:  ***   Consultations Obtained:  I requested consultation with the ***,  and discussed lab and imaging findings as well as pertinent plan - they recommend: ***   Problem List / ED Course:  53 year old female, vital signs stable, continued hematuria, no flank pain or other systemic symptoms, mild suprapubic pressure, urinary frequency and urgency On physical  exam abdomen nondistended, mild pelvic pressure with palpation Workup today significant for white blood cell count of 10.6, hemoglobin of 11.6, CMP reassuring, urinalysis consistent with blood, glucose, leukocyte esterase moderate, greater than 50 white blood cells, no bacteria seen Based off of history and physical exam low clinical suspicion for kidney stone at this time due to lack of pain and description of symptoms, more suspicious for possible UTI with pelvic pressure, urinary urgency and frequency as well as blood in urine, patient given dose of Keflex  here in the emergency department and prescribed outpatient course I see no indication for admission at this time as patient does not have symptoms consistent with pyelonephritis, no systemic symptoms including fever, vital signs stable Plan is for discharge with outpatient course of Keflex  and follow-up with primary care provider, patient educated that if this is due to an infection and symptoms should improve with the Keflex , if they continue she needs to follow-up with her PCP or return to the emergency department for further evaluation, patient understanding of this Return precautions given Patient discharged Most likely diagnosis at this time is possible urinary tract infection, urine culture due to history of diabetes   Reevaluation:  After the interventions noted above, I reevaluated the patient and found that they have :stayed the same   Social Determinants of Health:  none   Dispostion:  After consideration of the diagnostic results and the patients response to treatment, I feel that the patent would benefit from ***.    {Document critical care time when appropriate  Document review of labs and clinical decision tools ie CHADS2VASC2, etc  Document your independent review of radiology images and any outside records  Document your discussion with family members, caretakers and with consultants  Document social determinants of  health affecting pt's care  Document your decision making why or why not admission, treatments were needed:32947:::1}   Final diagnoses:  None    ED Discharge Orders     None

## 2024-03-26 ENCOUNTER — Other Ambulatory Visit (HOSPITAL_COMMUNITY): Payer: Self-pay

## 2024-03-26 MED ORDER — CEPHALEXIN 500 MG PO CAPS
500.0000 mg | ORAL_CAPSULE | Freq: Two times a day (BID) | ORAL | 0 refills | Status: AC
  Filled 2024-03-26: qty 10, 5d supply, fill #0

## 2024-03-26 NOTE — Discharge Instructions (Addendum)
 It was a pleasure taking care of you today.  Based on your history, physical exam, as well as labs I feel you are safe for discharge.  Based off of your history and workup today I feel the most likely diagnosis is a urinary tract infection.  I feel this likely With your urinary frequency, urgency, as well as your pelvic pressure along with the blood in your urination.  Because of this I have prescribed you an antibiotic, please take as instructed and complete the entire course.  Please continue to monitor your symptoms, if you experience any of the following symptoms including but not limited to fever, chills, worsening abdominal pain, flank pain, worsening blood in urination, dizziness, weakness, chest pain, shortness of breath, or other concerning symptom please return to the emergency department.  If the blood in your urine is due to a urinary tract infection, it should improve with antibiotic treatment.  Your urine has been cultured, if an antibiotic change is needed you will be contacted by the pharmacy team.  I have also given you the phone number for [facility], if blood in the urine persists you can call and make an appointment.  Please keep in mind the antibiotics take 48 to 72 hours to take effect, if your symptoms are due to a urinary tract infection I would expect to see improvement within 3 to 4 days.  For pelvic pain/pressure you may take over-the-counter Tylenol or ibuprofen, please keep in mind that the max daily dose of Tylenol is 4000 g/day and the max daily dose of ibuprofen is 1200 mg/day, please do not exceed these daily limits.  Please make your primary care provider aware of your workup today and findings.  If your symptoms do not get better with antibiotic treatment you may need to see urology for further workup.  Also please continue to monitor symptoms when taking this new medication for possible allergic reaction since you have a penicillin allergy.

## 2024-03-28 LAB — URINE CULTURE: Culture: 50000 — AB

## 2024-04-03 ENCOUNTER — Encounter: Payer: Self-pay | Admitting: Family Medicine

## 2024-04-04 ENCOUNTER — Other Ambulatory Visit (HOSPITAL_COMMUNITY): Payer: Self-pay

## 2024-05-01 ENCOUNTER — Other Ambulatory Visit: Payer: Self-pay

## 2024-05-01 ENCOUNTER — Other Ambulatory Visit: Payer: Self-pay | Admitting: Internal Medicine

## 2024-05-01 ENCOUNTER — Other Ambulatory Visit (HOSPITAL_COMMUNITY): Payer: Self-pay

## 2024-05-01 DIAGNOSIS — E1165 Type 2 diabetes mellitus with hyperglycemia: Secondary | ICD-10-CM

## 2024-05-01 MED ORDER — MOUNJARO 10 MG/0.5ML ~~LOC~~ SOAJ
10.0000 mg | SUBCUTANEOUS | 0 refills | Status: DC
  Filled 2024-05-01: qty 6, 84d supply, fill #0

## 2024-05-08 ENCOUNTER — Other Ambulatory Visit (HOSPITAL_COMMUNITY): Payer: Self-pay

## 2024-05-19 ENCOUNTER — Encounter: Payer: Self-pay | Admitting: Internal Medicine

## 2024-05-19 ENCOUNTER — Ambulatory Visit: Admitting: Internal Medicine

## 2024-05-19 VITALS — BP 126/72 | HR 94 | Resp 18 | Ht 66.14 in | Wt 139.8 lb

## 2024-05-19 DIAGNOSIS — Z7984 Long term (current) use of oral hypoglycemic drugs: Secondary | ICD-10-CM

## 2024-05-19 DIAGNOSIS — E782 Mixed hyperlipidemia: Secondary | ICD-10-CM | POA: Diagnosis not present

## 2024-05-19 DIAGNOSIS — E1165 Type 2 diabetes mellitus with hyperglycemia: Secondary | ICD-10-CM | POA: Diagnosis not present

## 2024-05-19 DIAGNOSIS — Z794 Long term (current) use of insulin: Secondary | ICD-10-CM

## 2024-05-19 LAB — POCT GLYCOSYLATED HEMOGLOBIN (HGB A1C): Hemoglobin A1C: 6.7 % — AB (ref 4.0–5.6)

## 2024-05-19 NOTE — Patient Instructions (Addendum)
 Please continue: - Metformin  2000 mg with dinner - Jardiance  25 mg daily - Mounjaro  10 mg weekly - Semglee  8 units in am  Take: - Lyumjev  4-8 units before a meal with more cabs.  Please return in 4 months.

## 2024-05-19 NOTE — Progress Notes (Signed)
 Patient ID: [name] Young, female   DOB: 1970-07-28, 53 y.o.   MRN: [id number]  HPI: [name] Young is a 53 y.o.-year-old female, presenting for f/u for DM2, dx in 2008 (GDM 1998), insulin -dependent, uncontrolled, without long-term complications. Last visit 4.5 months ago.  Interim history: No increased urination, blurry vision, nausea, chest pain.  She has hot flushes -before last visit, she started a supplement with magnesium and vitamin E, which helps. She was in the emergency room with acute cystitis with hematuria recently.  This is the first time that she had something like this.  It resolved with antibiotics.  DM2: Reviewed HbA1c levels: Lab Results  Component Value Date   HGBA1C 7.4 (H) 01/02/2024   HGBA1C 6.7 (A) 08/09/2023   HGBA1C 7.0 (H) 11/29/2022   HGBA1C 6.6 (A) 06/15/2022   HGBA1C 6.3 (A) 12/08/2021   HGBA1C 7.1 (A) 08/05/2021   HGBA1C 6.9 (A) 05/03/2021   HGBA1C 8.9 (H) 12/29/2020   HGBA1C 7.3 (A) 09/28/2020   HGBA1C 7.0 (A) 05/31/2020   HGBA1C 7.5 (H) 01/05/2020   HGBA1C 6.8 (A) 06/04/2019   HGBA1C 7.8 (H) 02/28/2019   HGBA1C 7.6 (A) 10/22/2018   HGBA1C 6.9 (A) 03/22/2018   HGBA1C 6.7 11/19/2017   HGBA1C 6.7 08/21/2017   HGBA1C 6.5 04/20/2017   HGBA1C 6.7 01/19/2017   HGBA1C 6.4 09/07/2016   HGBA1C 6.6 (H) 03/16/2016   HGBA1C 6.5 03/16/2016   HGBA1C 6.5 12/13/2015   HGBA1C 6.4 09/13/2015   HGBA1C 7.6 06/14/2015   HGBA1C 6.8 (H) 03/22/2015   HGBA1C 6.5 03/12/2015   HGBA1C 7.0 (H) 12/10/2014   HGBA1C 7.6 (H) 08/26/2014   HGBA1C 7.5 (H) 05/15/2014   Since she was dx with DM >> she lost 50 lbs and was able to come off Lantus .  She is on: - Metformin  2000 mg with dinner - Jardiance  25 mg daily - Lantus  8 units at bedtime - added 07/2021 >> with dinner >> Semglee  8 units in am - Lyumjev   8  >> ... Not using >> restarted 6 to 8 units before a larger dinner >> before dinner and a larger lunch - rarely uses it - Mounjaro  10 mg weekly She was on  Trulicity  and Ozempic  1 mg prev. She was on Glipizide . She was previously on Invokana .  Pt checks her sugars >4 times a day now with a Dexcom CGM:  Previously:  Previously:   Lowest sugar was 49 (alcohol) >> .SABRA.  60s >> 61 (? Accurate) >> 60s; she has hypoglycemia awareness in the 60s Highest sugar was 317 >> 200s >> 357 >> 200s.  Glucometer: [employer] >> [employer]  Pt's meals are: - Breakfast: Eggs or oatmeal >> bacon and eggs or grits - Lunch: Salad or veggies or tuna salad with saltines or chicken tenders >> salads - Dinner: Fast food -take out - Snacks: Graham crackers  -No CKD, last BUN/creatinine:  Lab Results  Component Value Date   BUN 22 (H) 03/25/2024   CREATININE 0.70 03/25/2024   Lab Results  Component Value Date   MICRALBCREAT Unable to calculate 01/02/2024   MICRALBCREAT 4.6 10/21/2009   MICRALBCREAT 11.2 09/18/2006  Previously on lisinopril  but now off.  -+ HL; last set of lipids: Lab Results  Component Value Date   CHOL 158 01/02/2024   HDL 67.80 01/02/2024   LDLCALC 72 01/02/2024   LDLDIRECT 62.0 03/22/2015   TRIG 91.0 01/02/2024   CHOLHDL 2 01/02/2024  On Zocor  20.  - last eye exam was on  01/14/2024: reportedly No [facility].   -She has numbness and tingling in her feet.  Last foot exam 01/15/2024.  Vit B12 def. -She continues on B complex and 1000 mcg B12 daily  Review vitamin B12 levels: Lab Results  Component Value Date   VITAMINB12 1,361 (H) 11/29/2022   VITAMINB12 1,034 (H) 02/13/2022   VITAMINB12 963 (H) 09/28/2020   VITAMINB12 490 01/05/2020   VITAMINB12 1,216 (H) 02/28/2019   VITAMINB12 890 10/22/2018   VITAMINB12 909 12/05/2017   VITAMINB12 782 01/24/2017   VITAMINB12 969 (H) 03/16/2016   VITAMINB12 503 09/13/2015   ROS: + see HPI  I reviewed pt's medications, allergies, PMH, social hx, family hx, and changes were documented in the history of present illness. Otherwise, unchanged from my initial visit note.  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     Continuous Glucose Sensor (DEXCOM G7 SENSOR) MISC Use as directed for continuous glucose monitoring, changing the sensor every 10 days. 9 each 3   empagliflozin  (JARDIANCE ) 25 MG TABS tablet Take 1 tablet (25 mg total) by mouth daily. 90 tablet 3   fluticasone  (FLONASE ) 50 MCG/ACT nasal spray Place 1 spray into both nostrils at bedtime. 16 g 6   insulin  glargine-yfgn (SEMGLEE , YFGN,) 100 UNIT/ML Pen Inject 8-10 Units into the skin daily. 15 mL 3   Insulin  Lispro-aabc (LYUMJEV  KWIKPEN) 100 UNIT/ML KwikPen Inject 6-8 Units into the skin 2 (two) times daily. 15 mL 3   Insulin  Pen Needle (UNIFINE PENTIPS) 32G X 4 MM MISC Use once daily. 100 each 3   Lancets (FREESTYLE) lancets Use to check blood sugar 2 times a day. 200 each 12   metFORMIN  (GLUCOPHAGE ) 1000 MG tablet Take 2 tablets (2,000 mg total) by mouth daily with supper. 180 tablet 3   methocarbamol  (ROBAXIN ) 500 MG tablet Take 1 tablet (500 mg total) by mouth every 6 (six) hours as needed for muscle spasms. 30 tablet 0   Multiple Vitamins-Minerals (DIASENSE MULTIVITAMIN) TABS Take 1 tablet by mouth daily.     Omeprazole (PRILOSEC PO) Take by mouth as needed.       simvastatin  (ZOCOR ) 20 MG tablet Take 1 tablet (20 mg total) by mouth at bedtime. *need labs for more refills* 90 tablet 3   tirzepatide  (MOUNJARO ) 10 MG/0.5ML Pen Inject 10 mg into the skin once a week. 6 mL 0   tirzepatide  (MOUNJARO ) 12.5 MG/0.5ML Pen Inject 12.5 mg into the skin once a week. 2 mL 0   vitamin B-12 (CYANOCOBALAMIN ) 1000 MCG tablet Take 1,000 mcg by mouth daily.      [  DISCONTINUED] glipiZIDE  (GLUCOTROL ) 5 MG tablet Take 1 tablet (5 mg total) by mouth daily. (Patient not taking: Reported on 08/09/2023) 90 tablet 3   No current facility-administered medications on file prior to visit.   Allergies  Allergen Reactions   Penicillins     REACTION: RASH   Shingrix  [Zoster Vac Recomb Adjuvanted]     Patient developed itching at site a couple of days after shot.  Decided not to administer second dose   Family History  Problem Relation Age of Onset   Diabetes Other        Familly Hx First degree relative   Breast cancer Mother 65   Breast cancer Maternal Aunt 26   Brain cancer Maternal Uncle 2   Colon cancer Neg Hx    Esophageal cancer Neg Hx    Stomach cancer Neg Hx    Rectal cancer Neg Hx    PE: BP 126/72   Pulse 94   Resp 18   Ht 5' 6.14 (1.68 m)   Wt 139 lb 12.8 oz (63.4 kg)   LMP 08/26/2021   SpO2 98%   BMI 22.47 kg/m     Wt Readings from Last 3 Encounters:  05/19/24 139 lb 12.8 oz (63.4 kg)  01/15/24 138 lb 12.8 oz (63 kg)  01/02/24 134 lb (60.8 kg)   Constitutional: normal weight, in NAD Eyes:  EOMI, no exophthalmos ENT: no neck masses, no cervical lymphadenopathy Cardiovascular: Tachycardia, RR, No MRG Respiratory: CTA B Musculoskeletal: no deformities Skin:no rashes Neurological: no tremor with outstretched hands  ASSESSMENT: 1. DM2, insulin -dependent, uncontrolled, without long term complications, but with hyperglycemia  2. B12 deficiency - Further management per PCP  3. HL  PLAN:  1. Patient with longstanding, uncontrolled, type 2  diabetes, on complex medication regimen with metformin , SGLT2 inhibitor, weekly GLP-1/GIP receptor agonist and long-acting insulin , to which I recommended to add ultra rapid acting insulin  consistently at last visit.  At that time, sugars were fluctuating in the upper target range with increasing blood sugars and occasionally quite significant hyperglycemic spikes after lunch especially after dinner.  She was not taking Lyumjev  almost at all.  I recommended to take it before dinner and before a larger lunch but did not change the rest of the regimen. CGM interpretation: -At today's visit, we reviewed her CGM downloads: It appears that 76% of values are in target range (goal >70%), while 23% are higher than 180 (goal <25%), and 1% are lower than 70 (goal <4%).  The calculated average blood sugar is 149.  The projected HbA1c for the next 3 months (GMI) is 6.9%. -Reviewing the CGM trends, sugars appear to be fluctuating within the target range during the day, in the upper half of the target range during the night, and higher after dinner.  Upon questioning, she has been eating Halloween candy in the last 1 to 2 weeks and this is likely the reason why sugars appear to be higher compared to the previous 2 weeks.  We discussed about stopping these.  She is still not using Lyumjev  consistently, only occasionally.  For now, especially in the setting of an improvement in blood sugars in the last 2 days, we discussed about just using this with meals with more carbs. - I suggested to:  Patient Instructions  Please continue: - Metformin  2000 mg with dinner - Jardiance  25 mg daily - Mounjaro  10 mg weekly - Semglee  8 units in am  Take: - Lyumjev  4-8 units before a meal with more  cabs.  Please return in 4 months.  - we checked her HbA1c: 6.7% (lower) - advised to check sugars at different times of the day - 4x a day, rotating check times - advised for yearly eye exams >> she is UTD - return to clinic in 4  months  2..  HL - Latest lipid panel was at goal in 12/2023: Lab Results  Component Value Date   CHOL 158 01/02/2024   HDL 67.80 01/02/2024   LDLCALC 72 01/02/2024   LDLDIRECT 62.0 03/22/2015   TRIG 91.0 01/02/2024   CHOLHDL 2 01/02/2024  - She continues to go 10 mg daily without side effects  Lela Fendt, MD PhD [facility]

## 2024-05-19 NOTE — Addendum Note (Signed)
 Addended by: Journei Thomassen M on: 05/19/2024 02:58 PM   Modules accepted: Orders

## 2024-06-04 ENCOUNTER — Other Ambulatory Visit (HOSPITAL_COMMUNITY): Payer: Self-pay

## 2024-06-06 ENCOUNTER — Other Ambulatory Visit (HOSPITAL_COMMUNITY): Payer: Self-pay

## 2024-06-18 ENCOUNTER — Other Ambulatory Visit (HOSPITAL_COMMUNITY): Payer: Self-pay

## 2024-06-26 ENCOUNTER — Other Ambulatory Visit: Payer: Self-pay

## 2024-06-27 ENCOUNTER — Other Ambulatory Visit (HOSPITAL_COMMUNITY): Payer: Self-pay

## 2024-06-30 ENCOUNTER — Other Ambulatory Visit: Payer: Self-pay

## 2024-07-11 ENCOUNTER — Other Ambulatory Visit: Payer: Self-pay | Admitting: Internal Medicine

## 2024-07-11 ENCOUNTER — Other Ambulatory Visit (HOSPITAL_BASED_OUTPATIENT_CLINIC_OR_DEPARTMENT_OTHER): Payer: Self-pay

## 2024-07-11 ENCOUNTER — Other Ambulatory Visit: Payer: Self-pay

## 2024-07-11 DIAGNOSIS — E1165 Type 2 diabetes mellitus with hyperglycemia: Secondary | ICD-10-CM

## 2024-07-11 MED ORDER — DEXCOM G7 SENSOR MISC
3 refills | Status: AC
  Filled 2024-07-11 – 2024-07-15 (×2): qty 9, 90d supply, fill #0
  Filled 2024-07-22: qty 3, 30d supply, fill #0
  Filled 2024-07-22 – 2024-07-23 (×3): qty 9, 90d supply, fill #0
  Filled 2024-07-23: qty 3, 30d supply, fill #0
  Filled 2024-07-23: qty 9, 90d supply, fill #0

## 2024-07-14 ENCOUNTER — Other Ambulatory Visit (HOSPITAL_COMMUNITY): Payer: Self-pay

## 2024-07-15 ENCOUNTER — Telehealth (HOSPITAL_COMMUNITY): Payer: Self-pay

## 2024-07-15 ENCOUNTER — Other Ambulatory Visit (HOSPITAL_COMMUNITY): Payer: Self-pay

## 2024-07-15 ENCOUNTER — Other Ambulatory Visit: Payer: Self-pay

## 2024-07-15 ENCOUNTER — Other Ambulatory Visit (HOSPITAL_BASED_OUTPATIENT_CLINIC_OR_DEPARTMENT_OTHER): Payer: Self-pay

## 2024-07-15 DIAGNOSIS — E1165 Type 2 diabetes mellitus with hyperglycemia: Secondary | ICD-10-CM

## 2024-07-15 MED ORDER — LYUMJEV KWIKPEN 100 UNIT/ML ~~LOC~~ SOPN
6.0000 [IU] | PEN_INJECTOR | Freq: Two times a day (BID) | SUBCUTANEOUS | 3 refills | Status: AC
  Filled 2024-07-15: qty 12, 75d supply, fill #0
  Filled 2024-07-23: qty 15, 90d supply, fill #0

## 2024-07-15 MED ORDER — MOUNJARO 10 MG/0.5ML ~~LOC~~ SOAJ
10.0000 mg | SUBCUTANEOUS | 0 refills | Status: AC
  Filled 2024-07-15: qty 2, 28d supply, fill #0

## 2024-07-16 ENCOUNTER — Other Ambulatory Visit (HOSPITAL_COMMUNITY): Payer: Self-pay

## 2024-07-16 ENCOUNTER — Telehealth: Payer: Self-pay

## 2024-07-16 ENCOUNTER — Other Ambulatory Visit: Payer: Self-pay

## 2024-07-16 NOTE — Telephone Encounter (Signed)
 Pharmacy Patient Advocate Encounter   Received notification from Pt Calls Messages that prior authorization for Dexcom G7 sensor is required/requested.   Insurance verification completed.   The patient is insured through [facility].   Per test claim: PA required; PA submitted to above mentioned insurance via Latent Key/confirmation #/EOC [id number] Status is pending

## 2024-07-17 ENCOUNTER — Other Ambulatory Visit (HOSPITAL_COMMUNITY): Payer: Self-pay

## 2024-07-17 ENCOUNTER — Other Ambulatory Visit: Payer: Self-pay

## 2024-07-17 ENCOUNTER — Encounter: Payer: Self-pay | Admitting: Pharmacist

## 2024-07-21 ENCOUNTER — Telehealth: Payer: Self-pay

## 2024-07-21 NOTE — Telephone Encounter (Signed)
 Opt called stating that Semglee  is being d/c and wants to know what you would like to swithc her to?

## 2024-07-22 ENCOUNTER — Other Ambulatory Visit (HOSPITAL_COMMUNITY): Payer: Self-pay

## 2024-07-22 ENCOUNTER — Encounter (HOSPITAL_COMMUNITY): Payer: Self-pay

## 2024-07-22 MED ORDER — INSULIN GLARGINE 100 UNIT/ML SOLOSTAR PEN
8.0000 [IU] | PEN_INJECTOR | Freq: Every day | SUBCUTANEOUS | 1 refills | Status: AC
  Filled 2024-07-22: qty 6, 56d supply, fill #0
  Filled 2024-07-22: qty 6, 75d supply, fill #0

## 2024-07-23 ENCOUNTER — Other Ambulatory Visit (HOSPITAL_COMMUNITY): Payer: Self-pay

## 2024-07-23 ENCOUNTER — Other Ambulatory Visit: Payer: Self-pay

## 2024-07-23 ENCOUNTER — Telehealth: Payer: Self-pay

## 2024-07-23 NOTE — Telephone Encounter (Signed)
 Pt has called  me wanting to know the status of the PA that was submitted on 07/17/23

## 2024-07-23 NOTE — Telephone Encounter (Signed)
 Sample  Device/Supplies: Dexcom G7 Quantity:2 ONU:[id number] EXP:08/09/2025

## 2024-07-23 NOTE — Telephone Encounter (Signed)
 Pharmacy Patient Advocate Encounter  Received notification from [employer] that Prior Authorization for Dexcom G7 sensor has been CANCELLED due to Prior Authorization not required. Copay is $0.   PA #/Case ID/Reference #: [id number]

## 2024-08-10 ENCOUNTER — Other Ambulatory Visit: Payer: Self-pay | Admitting: Internal Medicine

## 2024-08-10 DIAGNOSIS — E119 Type 2 diabetes mellitus without complications: Secondary | ICD-10-CM

## 2024-09-16 ENCOUNTER — Ambulatory Visit: Admitting: Internal Medicine
